# Patient Record
Sex: Male | Born: 1990 | Race: Black or African American | Hispanic: No | Marital: Single | Smoking: Never smoker
Health system: Southern US, Community
[De-identification: ages and names within clinical notes are randomized; demographics above are authoritative.]

## PROBLEM LIST (undated history)

## (undated) DIAGNOSIS — M549 Dorsalgia, unspecified: Secondary | ICD-10-CM

## (undated) DIAGNOSIS — R569 Unspecified convulsions: Secondary | ICD-10-CM

## (undated) DIAGNOSIS — M303 Mucocutaneous lymph node syndrome [Kawasaki]: Secondary | ICD-10-CM

---

## 2013-02-16 ENCOUNTER — Emergency Department (HOSPITAL_COMMUNITY)
Admission: EM | Admit: 2013-02-16 | Discharge: 2013-02-16 | Disposition: A | Discharge status: Home / Self Care | Payer: BC Managed Care – PPO | Source: Home / Self Care | Attending: Family Medicine | Admitting: Family Medicine

## 2013-02-16 ENCOUNTER — Encounter (HOSPITAL_COMMUNITY): Payer: Self-pay | Admitting: *Deleted

## 2013-02-16 DIAGNOSIS — B9789 Other viral agents as the cause of diseases classified elsewhere: Secondary | ICD-10-CM

## 2013-02-16 DIAGNOSIS — B349 Viral infection, unspecified: Secondary | ICD-10-CM

## 2013-02-16 HISTORY — DX: Dorsalgia, unspecified: M54.9

## 2013-02-16 MED ORDER — PROMETHAZINE HCL 25 MG PO TABS: 25.0000 mg | ORAL_TABLET | Freq: Four times a day (QID) | ORAL | Status: DC | PRN

## 2013-02-16 MED ORDER — ONDANSETRON 4 MG PO TBDP
4.0000 mg | ORAL_TABLET | Freq: Once | ORAL | Status: AC
  Administered 2013-02-16: 4 mg via ORAL

## 2013-02-16 MED ORDER — ONDANSETRON 4 MG PO TBDP
ORAL_TABLET | ORAL | Status: AC
  Filled 2013-02-16: qty 1

## 2013-02-16 NOTE — ED Notes (Signed)
PT REPORTS  SYMPTOMS  OF  COUGH  ./  CONGESTED       SORETHROAT         SNEEZING      WITH  THE  SYMPTOMS  STARTING  SEV  DAYS  AGO                pT  REPORTS  THE  COUGH  IS  PRODUCING MUCOUS  HE  REPORTS   -  AT  THIS TIME  THE  PT IS  SITTING UPRIGHT ON THE   EXAM TABLE      SPEAKING IN  COMPLETE  SENTANCES

## 2013-02-16 NOTE — ED Provider Notes (Signed)
Medical screening examination/treatment/procedure(s) were performed by non-physician practitioner and as supervising physician I was immediately available for consultation/collaboration.   [facility]; MD  Sharin Grave, MD 02/16/13 1318

## 2013-02-16 NOTE — ED Provider Notes (Signed)
CSN: [id number]     Arrival date & time 02/16/13  1039 History   First MD Initiated Contact with Patient 02/16/13 1127     Chief Complaint  Patient presents with  . URI   (Consider location/radiation/quality/duration/timing/severity/associated sxs/prior Treatment) Patient is a 22 y.o. male presenting with URI. The history is provided by the patient.  URI Presenting symptoms: congestion, cough, fever and sore throat   Presenting symptoms: no ear pain and no rhinorrhea   Severity:  Moderate Onset quality:  Gradual Duration:  3 days Timing:  Constant Progression:  Unchanged Chronicity:  New Relieved by:  Nothing Worsened by:  Eating Ineffective treatments:  OTC medications Associated symptoms: headaches   Associated symptoms: no sinus pain, no swollen glands and no wheezing   Associated symptoms comment:  Vomiting x 2 in last 2 days   Past Medical History  Diagnosis Date  . Back pain    History reviewed. No pertinent past surgical history. History reviewed. No pertinent family history. History  Substance Use Topics  . Smoking status: Not on file  . Smokeless tobacco: Not on file  . Alcohol Use: Not on file    Review of Systems  Constitutional: Positive for fever and chills.  HENT: Positive for congestion and sore throat. Negative for ear pain and rhinorrhea.   Respiratory: Positive for cough. Negative for wheezing.   Gastrointestinal: Positive for nausea and vomiting. Negative for abdominal pain, diarrhea and constipation.  Neurological: Positive for headaches.    Allergies  Review of patient's allergies indicates not on file.  Home Medications   Current Outpatient Rx  Name  Route  Sig  Dispense  Refill  . GuaiFENesin (MUCINEX PO)   Oral   Take by mouth.         . promethazine (PHENERGAN) 25 MG tablet   Oral   Take 1 tablet (25 mg total) by mouth every 6 (six) hours as needed for nausea.   12 tablet   0    BP 131/84  Pulse 86  Temp(Src) 99.1 F (37.3  C) (Oral)  Resp 20  SpO2 99% Physical Exam  Constitutional: He appears well-developed and well-nourished. No distress.  HENT:  Right Ear: Tympanic membrane, external ear and ear canal normal.  Left Ear: Tympanic membrane, external ear and ear canal normal.  Nose: Mucosal edema present.  Mouth/Throat: Oropharynx is clear and moist and mucous membranes are normal.  Cardiovascular: Normal rate and regular rhythm.   Pulmonary/Chest: Effort normal and breath sounds normal.  Abdominal: Soft. Bowel sounds are normal. He exhibits no distension. There is no tenderness. There is no rigidity, no rebound and no guarding.    ED Course  Procedures (including critical care time) Labs Review Labs Reviewed - No data to display Imaging Review No results found.  MDM   1. Viral infection   I have seen a number of patients lately with cold sx and vomiting without any other GI sx.  I suspect this is a virus. Encouraged use of otc meds to help manage sx. Given zofran 4mg  ODT here in [facility], rx phenergan 25mg  q6 prn nausea #12. Encouraged po fluid intake to prevent dehydration.      Cathlyn Parsons, NP 02/16/13 1135

## 2013-08-14 ENCOUNTER — Emergency Department (HOSPITAL_COMMUNITY)
Admission: EM | Admit: 2013-08-14 | Discharge: 2013-08-14 | Disposition: A | Discharge status: Home / Self Care | Payer: BC Managed Care – PPO | Source: Home / Self Care | Attending: Family Medicine | Admitting: Family Medicine

## 2013-08-14 ENCOUNTER — Encounter (HOSPITAL_COMMUNITY): Payer: Self-pay | Admitting: Emergency Medicine

## 2013-08-14 DIAGNOSIS — K529 Noninfective gastroenteritis and colitis, unspecified: Secondary | ICD-10-CM

## 2013-08-14 DIAGNOSIS — K5289 Other specified noninfective gastroenteritis and colitis: Secondary | ICD-10-CM

## 2013-08-14 HISTORY — DX: Mucocutaneous lymph node syndrome (kawasaki): M30.3

## 2013-08-14 MED ORDER — ONDANSETRON 4 MG PO TBDP
8.0000 mg | ORAL_TABLET | Freq: Once | ORAL | Status: AC
  Administered 2013-08-14: 8 mg via ORAL

## 2013-08-14 MED ORDER — ONDANSETRON 8 MG PO TBDP: 8.0000 mg | ORAL_TABLET | Freq: Three times a day (TID) | ORAL | Status: DC | PRN

## 2013-08-14 MED ORDER — DIPHENOXYLATE-ATROPINE 2.5-0.025 MG PO TABS: 2.0000 | ORAL_TABLET | Freq: Four times a day (QID) | ORAL | Status: DC | PRN

## 2013-08-14 MED ORDER — ONDANSETRON 4 MG PO TBDP
ORAL_TABLET | ORAL | Status: AC
  Filled 2013-08-14: qty 2

## 2013-08-14 NOTE — Discharge Instructions (Signed)
Diet for Diarrhea, Adult °Frequent, runny stools (diarrhea) may be caused or worsened by food or drink. Diarrhea may be relieved by changing your diet. Since diarrhea can last up to 7 days, it is easy for you to lose too much fluid from the body and become dehydrated. Fluids that are lost need to be replaced. Along with a modified diet, make sure you drink enough fluids to keep your urine clear or pale yellow. °DIET INSTRUCTIONS °· Ensure adequate fluid intake (hydration): have 1 cup (8 oz) of fluid for each diarrhea episode. Avoid fluids that contain simple sugars or sports drinks, fruit juices, whole milk products, and sodas. Your urine should be clear or pale yellow if you are drinking enough fluids. Hydrate with an oral rehydration solution that you can purchase at pharmacies, retail stores, and online. You can prepare an oral rehydration solution at home by mixing the following ingredients together: °·   tsp table salt. °· ¾ tsp baking soda. °·  tsp salt substitute containing potassium chloride. °· 1  tablespoons sugar. °· 1 L (34 oz) of water. °· Certain foods and beverages may increase the speed at which food moves through the gastrointestinal (GI) tract. These foods and beverages should be avoided and include: °· Caffeinated and alcoholic beverages. °· High-fiber foods, such as raw fruits and vegetables, nuts, seeds, and whole grain breads and cereals. °· Foods and beverages sweetened with sugar alcohols, such as xylitol, sorbitol, and mannitol. °· Some foods may be well tolerated and may help thicken stool including: °· Starchy foods, such as rice, toast, pasta, low-sugar cereal, oatmeal, grits, baked potatoes, crackers, and bagels.   °· Bananas.   °· Applesauce. °· Add probiotic-rich foods to help increase healthy bacteria in the GI tract, such as yogurt and fermented milk products. °RECOMMENDED FOODS AND BEVERAGES °Starches °Choose foods with less than 2 g of fiber per serving. °· Recommended:  White,  French, and pita breads, plain rolls, buns, bagels. Plain muffins, matzo. Soda, saltine, or graham crackers. Pretzels, melba toast, zwieback. Cooked cereals made with water: cornmeal, farina, cream cereals. Dry cereals: refined corn, wheat, rice. Potatoes prepared any way without skins, refined macaroni, spaghetti, noodles, refined rice. °· Avoid:  Bread, rolls, or crackers made with whole wheat, multi-grains, rye, bran seeds, nuts, or coconut. Corn tortillas or taco shells. Cereals containing whole grains, multi-grains, bran, coconut, nuts, raisins. Cooked or dry oatmeal. Coarse wheat cereals, granola. Cereals advertised as "high-fiber." Potato skins. Whole grain pasta, wild or brown rice. Popcorn. Sweet potatoes, yams. Sweet rolls, doughnuts, waffles, pancakes, sweet breads. °Vegetables °· Recommended: Strained tomato and vegetable juices. Most well-cooked and canned vegetables without seeds. Fresh: Tender lettuce, cucumber without the skin, cabbage, spinach, bean sprouts. °· Avoid: Fresh, cooked, or canned: Artichokes, baked beans, beet greens, broccoli, Brussels sprouts, corn, kale, legumes, peas, sweet potatoes. Cooked: Green or red cabbage, spinach. Avoid large servings of any vegetables because vegetables shrink when cooked, and they contain more fiber per serving than fresh vegetables. °Fruit °· Recommended: Cooked or canned: Apricots, applesauce, cantaloupe, cherries, fruit cocktail, grapefruit, grapes, kiwi, mandarin oranges, peaches, pears, plums, watermelon. Fresh: Apples without skin, ripe banana, grapes, cantaloupe, cherries, grapefruit, peaches, oranges, plums. Keep servings limited to ½ cup or 1 piece. °· Avoid: Fresh: Apples with skin, apricots, mangoes, pears, raspberries, strawberries. Prune juice, stewed or dried prunes. Dried fruits, raisins, dates. Large servings of all fresh fruits. °Protein °· Recommended: Ground or well-cooked tender beef, ham, veal, lamb, pork, or poultry. Eggs. Fish,  oysters, shrimp,   lobster, other seafoods. Liver, organ meats. °· Avoid: Tough, fibrous meats with gristle. Peanut butter, smooth or chunky. Cheese, nuts, seeds, legumes, dried peas, beans, lentils. °Dairy °· Recommended: Yogurt, lactose-free milk, kefir, drinkable yogurt, buttermilk, soy milk, or plain hard cheese. °· Avoid: Milk, chocolate milk, beverages made with milk, such as milkshakes. °Soups °· Recommended: Bouillon, broth, or soups made from allowed foods. Any strained soup. °· Avoid: Soups made from vegetables that are not allowed, cream or milk-based soups. °Desserts and Sweets °· Recommended: Sugar-free gelatin, sugar-free frozen ice pops made without sugar alcohol. °· Avoid: Plain cakes and cookies, pie made with fruit, pudding, custard, cream pie. Gelatin, fruit, ice, sherbet, frozen ice pops. Ice cream, ice milk without nuts. Plain hard candy, honey, jelly, molasses, syrup, sugar, chocolate syrup, gumdrops, marshmallows. °Fats and Oils °· Recommended: Limit fats to less than 8 tsp per day. °· Avoid: Seeds, nuts, olives, avocados. Margarine, butter, cream, mayonnaise, salad oils, plain salad dressings. Plain gravy, crisp bacon without rind. °Beverages °· Recommended: Water, decaffeinated teas, oral rehydration solutions, sugar-free beverages not sweetened with sugar alcohols. °· Avoid: Fruit juices, caffeinated beverages (coffee, tea, soda), alcohol, sports drinks, or lemon-lime soda. °Condiments °· Recommended: Ketchup, mustard, horseradish, vinegar, cocoa powder. Spices in moderation: allspice, basil, bay leaves, celery powder or leaves, cinnamon, cumin powder, curry powder, ginger, mace, marjoram, onion or garlic powder, oregano, paprika, parsley flakes, ground pepper, rosemary, sage, savory, tarragon, thyme, turmeric. °· Avoid: Coconut, honey. °Document Released: 08/02/2003 Document Revised: 02/04/2012 Document Reviewed: 09/26/2011 °ExitCare® Patient Information ©2014 [employer]. ° °Viral  Gastroenteritis °Viral gastroenteritis is also known as stomach flu. This condition affects the stomach and intestinal tract. It can cause sudden diarrhea and vomiting. The illness typically lasts 3 to 8 days. Most people develop an immune response that eventually gets rid of the virus. While this natural response develops, the virus can make you quite ill. °CAUSES  °Many different viruses can cause gastroenteritis, such as rotavirus or noroviruses. You can catch one of these viruses by consuming contaminated food or water. You may also catch a virus by sharing utensils or other personal items with an infected person or by touching a contaminated surface. °SYMPTOMS  °The most common symptoms are diarrhea and vomiting. These problems can cause a severe loss of body fluids (dehydration) and a body salt (electrolyte) imbalance. Other symptoms may include: °· Fever. °· Headache. °· Fatigue. °· Abdominal pain. °DIAGNOSIS  °Your caregiver can usually diagnose viral gastroenteritis based on your symptoms and a physical exam. A stool sample may also be taken to test for the presence of viruses or other infections. °TREATMENT  °This illness typically goes away on its own. Treatments are aimed at rehydration. The most serious cases of viral gastroenteritis involve vomiting so severely that you are not able to keep fluids down. In these cases, fluids must be given through an intravenous line (IV). °HOME CARE INSTRUCTIONS  °· Drink enough fluids to keep your urine clear or pale yellow. Drink small amounts of fluids frequently and increase the amounts as tolerated. °· Ask your caregiver for specific rehydration instructions. °· Avoid: °· Foods high in sugar. °· Alcohol. °· Carbonated drinks. °· Tobacco. °· Juice. °· Caffeine drinks. °· Extremely hot or cold fluids. °· Fatty, greasy foods. °· Too much intake of anything at one time. °· Dairy products until 24 to 48 hours after diarrhea stops. °· You may consume probiotics.  Probiotics are active cultures of beneficial bacteria. They may lessen the amount and number   of diarrheal stools in adults. Probiotics can be found in yogurt with active cultures and in supplements. °· Wash your hands well to avoid spreading the virus. °· Only take over-the-counter or prescription medicines for pain, discomfort, or fever as directed by your caregiver. Do not give aspirin to children. Antidiarrheal medicines are not recommended. °· Ask your caregiver if you should continue to take your regular prescribed and over-the-counter medicines. °· Keep all follow-up appointments as directed by your caregiver. °SEEK IMMEDIATE MEDICAL CARE IF:  °· You are unable to keep fluids down. °· You do not urinate at least once every 6 to 8 hours. °· You develop shortness of breath. °· You notice blood in your stool or vomit. This may look like coffee grounds. °· You have abdominal pain that increases or is concentrated in one small area (localized). °· You have persistent vomiting or diarrhea. °· You have a fever. °· The patient is a child younger than 3 months, and he or she has a fever. °· The patient is a child older than 3 months, and he or she has a fever and persistent symptoms. °· The patient is a child older than 3 months, and he or she has a fever and symptoms suddenly get worse. °· The patient is a baby, and he or she has no tears when crying. °MAKE SURE YOU:  °· Understand these instructions. °· Will watch your condition. °· Will get help right away if you are not doing well or get worse. °Document Released: 05/12/2005 Document Revised: 08/04/2011 Document Reviewed: 02/26/2011 °ExitCare® Patient Information ©2014 [employer]. ° °

## 2013-08-14 NOTE — ED Notes (Signed)
Patient with diarrhea and vomiting.  Onset last night after eating a meal that he did not think chicken was cooked well enough.  Reports diarrhea and vomiting all night and generalized abdominal pain.

## 2013-08-14 NOTE — ED Provider Notes (Signed)
Medical screening examination/treatment/procedure(s) were performed by resident physician or non-physician practitioner and as supervising physician I was immediately available for consultation/collaboration.   Barkley BrunsKINDL,Dalisha Shively DOUGLAS MD.   Linna HoffJames D Shi Grose, MD 08/14/13 [phone]

## 2013-08-14 NOTE — ED Provider Notes (Signed)
CSN: [id number]     Arrival date & time 08/14/13  0912 History   First MD Initiated Contact with Patient 08/14/13 [id number]     Chief Complaint  Patient presents with  . Diarrhea  . Emesis   (Consider location/radiation/quality/duration/timing/severity/associated sxs/prior Treatment) HPI Comments: 23 year old male presents complaining of nausea, vomiting, diarrhea. He Ate some chicken fingers that he thinks are undercooked yesterday.  NVD since 3 AM, he has had about 11 episodes of watery diarrhea. He has had multiple episodes of vomiting but does seem to temporarily hold down liquids. He denies any blood in the diarrhea or vomitus. He denies any fever. He has not tried taking anything for this at home. He has been trying to keep up his fluids overnight so he does not get dehydrated.  He has a normal cramping abdominal pain, pain is not severe it started after the vomiting  Patient is a 23 y.o. male presenting with diarrhea and vomiting.  Diarrhea Associated symptoms: vomiting   Associated symptoms: no abdominal pain, no arthralgias, no chills, no fever and no myalgias   Emesis Associated symptoms: diarrhea   Associated symptoms: no abdominal pain, no arthralgias, no chills, no myalgias and no sore throat     Past Medical History  Diagnosis Date  . Back pain   . Kawasaki disease    History reviewed. No pertinent past surgical history. No family history on file. History  Substance Use Topics  . Smoking status: Never Smoker   . Smokeless tobacco: Not on file  . Alcohol Use: No    Review of Systems  Constitutional: Negative for fever, chills and fatigue.  HENT: Negative for sore throat.   Eyes: Negative for visual disturbance.  Respiratory: Negative for cough and shortness of breath.   Cardiovascular: Negative for chest pain, palpitations and leg swelling.  Gastrointestinal: Positive for nausea, vomiting and diarrhea. Negative for abdominal pain, constipation and blood in stool.   Genitourinary: Negative for dysuria, urgency, frequency and hematuria.  Musculoskeletal: Negative for arthralgias, myalgias, neck pain and neck stiffness.  Skin: Negative for rash.  Neurological: Negative for dizziness, weakness and light-headedness.    Allergies  Review of patient's allergies indicates no known allergies.  Home Medications   Current Outpatient Rx  Name  Route  Sig  Dispense  Refill  . diphenoxylate-atropine (LOMOTIL) 2.5-0.025 MG per tablet   Oral   Take 2 tablets by mouth 4 (four) times daily as needed for diarrhea or loose stools.   30 tablet   0   . GuaiFENesin (MUCINEX PO)   Oral   Take by mouth.         . ondansetron (ZOFRAN ODT) 8 MG disintegrating tablet   Oral   Take 1 tablet (8 mg total) by mouth every 8 (eight) hours as needed for nausea or vomiting.   12 tablet   0   . promethazine (PHENERGAN) 25 MG tablet   Oral   Take 1 tablet (25 mg total) by mouth every 6 (six) hours as needed for nausea.   12 tablet   0    BP 123/69  Pulse 85  Temp(Src) 98.4 F (36.9 C) (Oral)  Resp 18  SpO2 100% Physical Exam  Nursing note and vitals reviewed. Constitutional: He is oriented to person, place, and time. He appears well-developed and well-nourished. No distress.  HENT:  Head: Normocephalic.  Cardiovascular: Normal rate, regular rhythm and normal heart sounds.  Exam reveals no gallop and no friction rub.   No murmur  heard. Pulmonary/Chest: Effort normal and breath sounds normal. No respiratory distress. He has no wheezes. He has no rales.  Abdominal: Soft. There is generalized tenderness (minimal ). There is no rigidity, no rebound, no guarding, no CVA tenderness, no tenderness at McBurney's point and negative Murphy's sign. No hernia.  Neurological: He is alert and oriented to person, place, and time. Coordination normal.  Skin: Skin is warm and dry. No rash noted. He is not diaphoretic.  Psychiatric: He has a normal mood and affect. Judgment  normal.    ED Course  Procedures (including critical care time) Labs Review Labs Reviewed - No data to display Imaging Review No results found.   MDM   1. Gastroenteritis    Afebrile, nontoxic, with minimal abdominal pain, treating for viral gastroenteritis with Lomotil and Zofran. Followup in 3-4 days if no improvement. He may followup sooner if worsening or symptoms are not controlled with medications  Meds ordered this encounter  Medications  . ondansetron (ZOFRAN-ODT) disintegrating tablet 8 mg    Sig:   . ondansetron (ZOFRAN ODT) 8 MG disintegrating tablet    Sig: Take 1 tablet (8 mg total) by mouth every 8 (eight) hours as needed for nausea or vomiting.    Dispense:  12 tablet    Refill:  0    Order Specific Question:  Supervising Provider    Answer:  Linna Hoff [phone]  . diphenoxylate-atropine (LOMOTIL) 2.5-0.025 MG per tablet    Sig: Take 2 tablets by mouth 4 (four) times daily as needed for diarrhea or loose stools.    Dispense:  30 tablet    Refill:  0    Order Specific Question:  Supervising Provider    Answer:  Bradd Canary D [[id number]]     Graylon Good, PA-C 08/14/13 1022

## 2013-11-22 ENCOUNTER — Emergency Department (INDEPENDENT_AMBULATORY_CARE_PROVIDER_SITE_OTHER): Payer: BC Managed Care – PPO

## 2013-11-22 ENCOUNTER — Emergency Department (HOSPITAL_COMMUNITY)
Admission: EM | Admit: 2013-11-22 | Discharge: 2013-11-22 | Disposition: A | Discharge status: Home / Self Care | Payer: BC Managed Care – PPO | Source: Home / Self Care | Attending: Family Medicine | Admitting: Family Medicine

## 2013-11-22 ENCOUNTER — Encounter (HOSPITAL_COMMUNITY): Payer: Self-pay | Admitting: Emergency Medicine

## 2013-11-22 DIAGNOSIS — S43109A Unspecified dislocation of unspecified acromioclavicular joint, initial encounter: Secondary | ICD-10-CM

## 2013-11-22 DIAGNOSIS — W1809XA Striking against other object with subsequent fall, initial encounter: Secondary | ICD-10-CM

## 2013-11-22 DIAGNOSIS — S43102A Unspecified dislocation of left acromioclavicular joint, initial encounter: Secondary | ICD-10-CM

## 2013-11-22 MED ORDER — DICLOFENAC SODIUM 75 MG PO TBEC: 75.0000 mg | DELAYED_RELEASE_TABLET | Freq: Two times a day (BID) | ORAL | Status: DC | PRN

## 2013-11-22 MED ORDER — TRAMADOL HCL 50 MG PO TABS: 50.0000 mg | ORAL_TABLET | Freq: Four times a day (QID) | ORAL | Status: DC | PRN

## 2013-11-22 NOTE — ED Notes (Signed)
Reports injury left shoulder last night while getting out of the shower.  States slipped getting out of shower hitting left shoulder.  Unable to raise arm above head.  No relief with otc meds.

## 2013-11-22 NOTE — ED Provider Notes (Signed)
Gerald Anderson is a 23 y.o. male who presents to Urgent Care today for left shoulder injury. Patient is a right-hand dominant individual who suffered a slip and fall in the shower 2 days ago. He fell onto his left shoulder. He notes significant pain in the anterior aspect of the shoulder. The pain is worse with any abduction and external rotation. He denies any radiating pain weakness or numbness. He denies any fevers or chills nausea vomiting or diarrhea. History of Advil which helped only a little. The pain is severe and worse with activity.   Past Medical History  Diagnosis Date  . Back pain   . Kawasaki disease    History  Substance Use Topics  . Smoking status: Never Smoker   . Smokeless tobacco: Not on file  . Alcohol Use: Yes   ROS as above Medications: No current facility-administered medications for this encounter.   Current Outpatient Prescriptions  Medication Sig Dispense Refill  . diclofenac (VOLTAREN) 75 MG EC tablet Take 1 tablet (75 mg total) by mouth 2 (two) times daily as needed.  60 tablet  0  . traMADol (ULTRAM) 50 MG tablet Take 1 tablet (50 mg total) by mouth every 6 (six) hours as needed.  15 tablet  0  . [DISCONTINUED] promethazine (PHENERGAN) 25 MG tablet Take 1 tablet (25 mg total) by mouth every 6 (six) hours as needed for nausea.  12 tablet  0    Exam:  BP 135/88  Pulse 78  Temp(Src) 98.1 F (36.7 C) (Oral)  Resp 16  SpO2 100% Gen: Well NAD HEENT: EOMI,  MMM Lungs: Normal work of breathing. CTABL Heart: RRR no MRG Abd: NABS, Soft. NT, ND Exts: Brisk capillary refill, warm and well perfused.  Left shoulder: Ecchymosis and tenderness at the a.c. Joint Nontender along the mid to distal clavicle No visual deformity Range of motion normal external and internal rotation. Significant pain and guarding with abduction beyond Unable to perform impingement or strength testing because of pain  Left elbow and wrist are nontender normal motion. Grip strength  pulses capillary refill and sensation are intact distally.  Limited musculoskeletal ultrasound of the left shoulder:  A.c. joint is widened with a 30-50% separation. No bony abnormalities. Tender to probe Biceps tendon is normal-appearing and intact and in the groove Subscapularis tendon is normal-appearing with no tears Supraspinatus tendon is normal-appearing with no tears Infraspinatus is not well visualized but no obvious tears seen.  Impression: A.c. separation  No results found for this or any previous visit (from the past 24 hour(s)). Dg Shoulder Left  11/22/2013   CLINICAL DATA:  Fall and left shoulder pain.  EXAM: LEFT SHOULDER - 2+ VIEW  COMPARISON:  None.  FINDINGS: The left shoulder is located without acute fracture. Difficult to exclude a minimally displaced fracture involving the mid left clavicle. In addition, the lateral left clavicle is slightly elevated and difficult to exclude a left AC joint injury. Visualized left ribs are intact.  IMPRESSION: Mild cortical irregularity involving the left clavicle and a subtle fracture cannot be excluded in this area. In addition, it is difficult to exclude a left AC joint injury. If this is the area of clinical concern, consider dedicated clavicle and/or AC joint imaging.  Left shoulder is located.   Electronically Signed   By: Richarda OverlieAdam  Henn M.D.   On: 11/22/2013 09:03    Assessment and Plan: 23 y.o. male with a.c. separation likely grade 2.. Doubtful for clavicle fracture as patient is nontender  in the area of question on x-ray. Additionally ultrasound supports diagnosis of clavicle fracture. Very doubtful for rotator cuff tear. Plan for sling NSAIDs and tramadol. Followup at sports medicine as needed.  Discussed warning signs or symptoms. Please see discharge instructions. Patient expresses understanding.    Rodolph BongEvan S Corey, MD 11/22/13 [phone]

## 2013-11-22 NOTE — Discharge Instructions (Signed)
Thank you for coming in today. Take diclofenac twice daily it will not make you sleepy.  Use tramadol for severe pain. Do not take and drive or go to work.   Acromioclavicular Separation with Rehab The acromioclavicular joint is the joint between the roof of the shoulder (acromion) and the collarbone (clavicle). It is vulnerable to injury. An acromioclavicular [facility]) separation is a partial or complete tear (sprain), injury, or redness and soreness (inflammation) of the ligaments that cross the acromioclavicular joint and hold it in place. There are two ligaments in this area that are vulnerable to injury, the acromioclavicular ligament and the coracoclavicular ligament. SYMPTOMS   Tenderness and swelling, or a bump on top of the shoulder (at the [facility] joint).  Bruising (contusion) in the area within 48 hours of injury.  Loss of strength or pain when reaching over the head or across the body. CAUSES  AC separation is caused by direct trauma to the joint (falling on your shoulder) or indirect trauma (falling on an outstretched arm). RISK INCREASES WITH:  Sports that require contact or collision, throwing sports (i.e. racquetball, squash).  Poor strength and flexibility.  Previous shoulder sprain or dislocation.  Poorly fitted or padded protective equipment. PREVENTION   Warm-up and stretch properly before activity.  Maintain physical fitness:  Shoulder strength.  Shoulder flexibility.  Cardiovascular fitness.  Wear properly fitted and padded protective equipment.  Learn and use proper technique when playing sports. Have a coach correct improper technique, including falling and landing.  Apply taping, protective strapping or padding, or an adhesive bandage as recommended before practice or competition. PROGNOSIS   If treated properly, the symptoms of AC separation can be expected to go away.  If treated improperly, permanent disability may occur unless surgery is  performed.  Healing time varies with type of sport and position, arm injured (dominant versus non-dominant) and severity of sprain. RELATED COMPLICATIONS  Weakness and fatigue of the arm or shoulder are possible but uncommon.  Pain and inflammation of the [facility] joint may continue.  Prolonged healing time may be necessary if usual activities are resumed too early. This causes a susceptibility to recurrent injury.  Prolonged disability may occur.  The shoulder may remain unstable or arthritic following repeated injury. TREATMENT  Treatment initially involves ice and medication to help reduce pain and inflammation. It may also be necessary to modify your activities in order to prevent further injury. Both non-surgical and surgical interventions exist to treat AC separation. Non-surgical intervention is usually recommended and involves wearing a sling to immobilize the joint for a period of time to allow for healing. Surgical intervention is usually only considered for severe sprains of the ligament or for individuals who do not improve after 2 to 6 months of non-surgical treatment. Surgical interventions require 4 to 6 months before a return to sports is possible. MEDICATION  If pain medication is necessary, nonsteroidal anti-inflammatory medications, such as aspirin and ibuprofen, or other minor pain relievers, such as acetaminophen, are often recommended.  Do not take pain medication for 7 days before surgery.  Prescription pain relievers may be given by your caregiver. Use only as directed and only as much as you need.  Ointments applied to the skin may be helpful.  Corticosteroid injections may be given to reduce inflammation. HEAT AND COLD  Cold treatment (icing) relieves pain and reduces inflammation. Cold treatment should be applied for 10 to 15 minutes every 2 to 3 hours for inflammation and pain and immediately after any  activity that aggravates your symptoms. Use ice packs or an  ice massage.  Heat treatment may be used prior to performing the stretching and strengthening activities prescribed by your caregiver, physical therapist or athletic trainer. Use a heat pack or a warm soak. SEEK IMMEDIATE MEDICAL CARE IF:   Pain, swelling or bruising worsens despite treatment.  There is pain, numbness or coldness in the arm.  Discoloration appears in the fingernails.  New, unexplained symptoms develop. EXERCISES  RANGE OF MOTION (ROM) AND STRETCHING EXERCISES - Acromioclavicular Separation These exercises may help you when beginning to rehabilitate your injury. Your symptoms may resolve with or without further involvement from your physician, physical therapist or athletic trainer. While completing these exercises, remember:  Restoring tissue flexibility helps normal motion to return to the joints. This allows healthier, less painful movement and activity.  An effective stretch should be held for at least 30 seconds.  A stretch should never be painful. You should only feel a gentle lengthening or release in the stretched tissue. ROM - Pendulum  Bend at the waist so that your right / left arm falls away from your body. Support yourself with your opposite hand on a solid surface, such as a table or a countertop.  Your right / left arm should be perpendicular to the ground. If it is not perpendicular, you need to lean over farther. Relax the muscles in your right / left arm and shoulder as much as possible.  Gently sway your hips and trunk so they move your right / left arm without any use of your right / left shoulder muscles.  Progress your movements so that your right / left arm moves side to side, then forward and backward, and finally, both clockwise and counterclockwise.  Complete __________ repetitions in each direction. Many people use this exercise to relieve discomfort in their shoulder as well as to gain range of motion. Repeat __________ times. Complete this  exercise __________ times per day. STRETCH - Flexion, Seated   Sit in a firm chair so that your right / left forearm can rest on a table or countertop. Your right / left elbow should rest below the height of your shoulder so that your shoulder feels supported and not tense or uncomfortable.  Keeping your right / left shoulder relaxed, lean forward at your waist, allowing your right / left hand to slide forward. Bend forward until you feel a moderate stretch in your shoulder, but before you feel an increase in your pain.  Hold __________ seconds. Slowly return to your starting position. Repeat __________ times. Complete this exercise __________ times per day. STRETCH - Flexion, Standing  Stand with good posture. With an underhand grip on your right / left and an overhand grip on the opposite hand, grasp a broomstick or cane so that your hands are a little more than shoulder-width apart.  Keeping your right / left elbow straight and shoulder muscles relaxed, push the stick with your opposite hand to raise your right / left arm in front of your body and then overhead. Raise your arm until you feel a stretch in your right / left shoulder, but before you have increased shoulder pain.  Try to avoid shrugging your right / left shoulder as your arm rises by keeping your shoulder blade tucked down and toward your mid-back spine. Hold __________ seconds.  Slowly return to the starting position. Repeat __________ times. Complete this exercise __________ times per day. STRENGTHENING EXERCISES - Acromioclavicular Separation These exercises may  help you when beginning to rehabilitate your injury. They may resolve your symptoms with or without further involvement from your physician, physical therapist or athletic trainer. While completing these exercises, remember:  Muscles can gain both the endurance and the strength needed for everyday activities through controlled exercises.  Complete these exercises as  instructed by your physician, physical therapist or athletic trainer. Progress the resistance and repetitions only as guided.  You may experience muscle soreness or fatigue, but the pain or discomfort you are trying to eliminate should never worsen during these exercises. If this pain does worsen, stop and make certain you are following the directions exactly. If the pain is still present after adjustments, discontinue the exercise until you can discuss the trouble with your clinician. STRENGTH - Shoulder Abductors, Isometric   With good posture, stand or sit about 4-6 inches from a wall with your right / left side facing the wall.  Bend your right / left elbow. Gently press your right / left elbow into the wall. Increase the pressure gradually until you are pressing as hard as you can without shrugging your shoulder or increasing any shoulder discomfort.  Hold __________ seconds.  Release the tension slowly. Relax your shoulder muscles completely before you start the next repetition. Repeat __________ times. Complete this exercise __________ times per day. STRENGTH - Internal Rotators, Isometric  Keep your right / left elbow at your side and bend it 90 degrees.  Step into a door frame so that the inside of your right / left wrist can press against the door frame without your upper arm leaving your side.  Gently press your right / left wrist into the door frame as if you were trying to draw the palm of your hand to your abdomen. Gradually increase the tension until you are pressing as hard as you can without shrugging your shoulder or increasing any shoulder discomfort.  Hold __________ seconds.  Release the tension slowly. Relax your shoulder muscles completely before you the next repetition. Repeat __________ times. Complete this exercise __________ times per day.  STRENGTH - External Rotators, Isometric  Keep your right / left elbow at your side and bend it 90 degrees.  Step into a  door frame so that the outside of your right / left wrist can press against the door frame without your upper arm leaving your side.  Gently press your right / left wrist into the door frame as if you were trying to swing the back of your hand away from your abdomen. Gradually increase the tension until you are pressing as hard as you can without shrugging your shoulder or increasing any shoulder discomfort.  Hold __________ seconds.  Release the tension slowly. Relax your shoulder muscles completely before you the next repetition. Repeat __________ times. Complete this exercise __________ times per day. STRENGTH - Internal Rotators  Secure a rubber exercise band/tubing to a fixed object so that it is at the same height as your right / left elbow when you are standing or sitting on a firm surface.  Stand or sit so that the secured exercise band/tubing is at your right / left side.  Bend your elbow 90 degrees. Place a folded towel or small pillow under your right / left arm so that your elbow is a few inches away from your side.  Keeping the tension on the exercise band/tubing, pull it across your body toward your abdomen. Be sure to keep your body steady so that the movement is only coming  from your shoulder rotating.  Hold __________ seconds. Release the tension in a controlled manner as you return to the starting position. Repeat __________ times. Complete this exercise __________ times per day. STRENGTH - External Rotators  Secure a rubber exercise band/tubing to a fixed object so that it is at the same height as your right / left elbow when you are standing or sitting on a firm surface.  Stand or sit so that the secured exercise band/tubing is at your side that is not injured.  Bend your elbow 90 degrees. Place a folded towel or small pillow under your right / left arm so that your elbow is a few inches away from your side.  Keeping the tension on the exercise band/tubing, pull it  away from your body, as if pivoting on your elbow. Be sure to keep your body steady so that the movement is only coming from your shoulder rotating.  Hold __________ seconds. Release the tension in a controlled manner as you return to the starting position. Repeat __________ times. Complete this exercise __________ times per day. Document Released: 05/12/2005 Document Revised: 08/04/2011 Document Reviewed: 08/24/2008 [facility] Patient Information 2015 [place], [place]. This information is not intended to replace advice given to you by your health care provider. Make sure you discuss any questions you have with your health care provider.

## 2014-07-11 ENCOUNTER — Emergency Department (HOSPITAL_COMMUNITY)
Admission: EM | Admit: 2014-07-11 | Discharge: 2014-07-11 | Disposition: A | Discharge status: Home / Self Care | Payer: BLUE CROSS/BLUE SHIELD | Attending: Emergency Medicine | Admitting: Emergency Medicine

## 2014-07-11 ENCOUNTER — Encounter (HOSPITAL_COMMUNITY): Payer: Self-pay | Admitting: Adult Health

## 2014-07-11 DIAGNOSIS — M545 Low back pain: Secondary | ICD-10-CM | POA: Diagnosis not present

## 2014-07-11 DIAGNOSIS — Z791 Long term (current) use of non-steroidal anti-inflammatories (NSAID): Secondary | ICD-10-CM | POA: Diagnosis not present

## 2014-07-11 DIAGNOSIS — G8929 Other chronic pain: Secondary | ICD-10-CM | POA: Insufficient documentation

## 2014-07-11 MED ORDER — KETOROLAC TROMETHAMINE 30 MG/ML IJ SOLN
60.0000 mg | Freq: Once | INTRAMUSCULAR | Status: AC
  Administered 2014-07-11: 60 mg via INTRAMUSCULAR
  Filled 2014-07-11: qty 2

## 2014-07-11 NOTE — Discharge Instructions (Signed)
Recommend 600 mg ibuprofen every 6 hours as needed for back pain. Alternate ice and heat to your back 3-4 times per day. Also recommend that you make contact with a primary care provider. You may follow-up with a neurosurgeon if desired. You have been scheduled for an outpatient MRI. You may have this completed if you would like to further evaluate the cause of your worsening pain. Return to the ED if you develop worsening back pain with fever, loss of your bowel/bladder function, or any of the symptoms listed below.  Chronic Back Pain  When back pain lasts longer than 3 months, it is called chronic back pain.People with chronic back pain often go through certain periods that are more intense (flare-ups).  CAUSES Chronic back pain can be caused by wear and tear (degeneration) on different structures in your back. These structures include:  The bones of your spine (vertebrae) and the joints surrounding your spinal cord and nerve roots (facets).  The strong, fibrous tissues that connect your vertebrae (ligaments). Degeneration of these structures may result in pressure on your nerves. This can lead to constant pain. HOME CARE INSTRUCTIONS  Avoid bending, heavy lifting, prolonged sitting, and activities which make the problem worse.  Take brief periods of rest throughout the day to reduce your pain. Lying down or standing usually is better than sitting while you are resting.  Take over-the-counter or prescription medicines only as directed by your caregiver. SEEK IMMEDIATE MEDICAL CARE IF:   You have weakness or numbness in one of your legs or feet.  You have trouble controlling your bladder or bowels.  You have nausea, vomiting, abdominal pain, shortness of breath, or fainting. Document Released: 06/19/2004 Document Revised: 08/04/2011 Document Reviewed: 04/26/2011 [facility] Patient Information 2015 [place], [place]. This information is not intended to replace advice given to you by your  health care provider. Make sure you discuss any questions you have with your health care provider.   Back Exercises Back exercises help treat and prevent back injuries. The goal of back exercises is to increase the strength of your abdominal and back muscles and the flexibility of your back. These exercises should be started when you no longer have back pain. Back exercises include:  Pelvic Tilt. Lie on your back with your knees bent. Tilt your pelvis until the lower part of your back is against the floor. Hold this position 5 to 10 sec and repeat 5 to 10 times.  Knee to Chest. Pull first 1 knee up against your chest and hold for 20 to 30 seconds, repeat this with the other knee, and then both knees. This may be done with the other leg straight or bent, whichever feels better.  Sit-Ups or Curl-Ups. Bend your knees 90 degrees. Start with tilting your pelvis, and do a partial, slow sit-up, lifting your trunk only 30 to 45 degrees off the floor. Take at least 2 to 3 seconds for each sit-up. Do not do sit-ups with your knees out straight. If partial sit-ups are difficult, simply do the above but with only tightening your abdominal muscles and holding it as directed.  Hip-Lift. Lie on your back with your knees flexed 90 degrees. Push down with your feet and shoulders as you raise your hips a couple inches off the floor; hold for 10 seconds, repeat 5 to 10 times.  Back arches. Lie on your stomach, propping yourself up on bent elbows. Slowly press on your hands, causing an arch in your low back. Repeat 3 to  5 times. Any initial stiffness and discomfort should lessen with repetition over time.  Shoulder-Lifts. Lie face down with arms beside your body. Keep hips and torso pressed to floor as you slowly lift your head and shoulders off the floor. Do not overdo your exercises, especially in the beginning. Exercises may cause you some mild back discomfort which lasts for a few minutes; however, if the pain is  more severe, or lasts for more than 15 minutes, do not continue exercises until you see your caregiver. Improvement with exercise therapy for back problems is slow.  See your caregivers for assistance with developing a proper back exercise program. Document Released: 06/19/2004 Document Revised: 08/04/2011 Document Reviewed: 03/13/2011 [facility] Patient Information 2015 [place], [place]. This information is not intended to replace advice given to you by your health care provider. Make sure you discuss any questions you have with your health care provider.  Back Injury Prevention Back injuries can be extremely painful and difficult to heal. After having one back injury, you are much more likely to experience another later on. It is important to learn how to avoid injuring or re-injuring your back. The following tips can help you to prevent a back injury. PHYSICAL FITNESS  Exercise regularly and try to develop good tone in your abdominal muscles. Your abdominal muscles provide a lot of the support needed by your back.  Do aerobic exercises (walking, jogging, biking, swimming) regularly.  Do exercises that increase balance and strength (tai chi, yoga) regularly. This can decrease your risk of falling and injuring your back.  Stretch before and after exercising.  Maintain a healthy weight. The more you weigh, the more stress is placed on your back. For every pound of weight, 10 times that amount of pressure is placed on the back. DIET  Talk to your caregiver about how much calcium and vitamin D you need per day. These nutrients help to prevent weakening of the bones (osteoporosis). Osteoporosis can cause broken (fractured) bones that lead to back pain.  Include good sources of calcium in your diet, such as dairy products, green, leafy vegetables, and products with calcium added (fortified).  Include good sources of vitamin D in your diet, such as milk and foods that are fortified with vitamin  D.  Consider taking a nutritional supplement or a multivitamin if needed.  Stop smoking if you smoke. POSTURE  Sit and stand up straight. Avoid leaning forward when you sit or hunching over when you stand.  Choose chairs with good low back (lumbar) support.  If you work at a desk, sit close to your work so you do not need to lean over. Keep your chin tucked in. Keep your neck drawn back and elbows bent at a right angle. Your arms should look like the letter "L."  Sit high and close to the steering wheel when you drive. Add a lumbar support to your car seat if needed.  Avoid sitting or standing in one position for too long. Take breaks to get up, stretch, and walk around at least once every hour. Take breaks if you are driving for long periods of time.  Sleep on your side with your knees slightly bent, or sleep on your back with a pillow under your knees. Do not sleep on your stomach. LIFTING, TWISTING, AND REACHING  Avoid heavy lifting, especially repetitive lifting. If you must do heavy lifting:  Stretch before lifting.  Work slowly.  Rest between lifts.  Use carts and dollies to move objects when  possible.  Make several small trips instead of carrying 1 heavy load.  Ask for help when you need it.  Ask for help when moving big, awkward objects.  Follow these steps when lifting:  Stand with your feet shoulder-width apart.  Get as close to the object as you can. Do not try to pick up heavy objects that are far from your body.  Use handles or lifting straps if they are available.  Bend at your knees. Squat down, but keep your heels off the floor.  Keep your shoulders pulled back, your chin tucked in, and your back straight.  Lift the object slowly, tightening the muscles in your legs, abdomen, and buttocks. Keep the object as close to the center of your body as possible.  When you put a load down, use these same guidelines in reverse.  Do not:  Lift the object  above your waist.  Twist at the waist while lifting or carrying a load. Move your feet if you need to turn, not your waist.  Bend over without bending at your knees.  Avoid reaching over your head, across a table, or for an object on a high surface. OTHER TIPS  Avoid wet floors and keep sidewalks clear of ice to prevent falls.  Do not sleep on a mattress that is too soft or too hard.  Keep items that are used frequently within easy reach.  Put heavier objects on shelves at waist level and lighter objects on lower or higher shelves.  Find ways to decrease your stress, such as exercise, massage, or relaxation techniques. Stress can build up in your muscles. Tense muscles are more vulnerable to injury.  Seek treatment for depression or anxiety if needed. These conditions can increase your risk of developing back pain. SEEK MEDICAL CARE IF:  You injure your back.  You have questions about diet, exercise, or other ways to prevent back injuries. MAKE SURE YOU:  Understand these instructions.  Will watch your condition.  Will get help right away if you are not doing well or get worse. Document Released: 06/19/2004 Document Revised: 08/04/2011 Document Reviewed: 06/23/2011 [facility] Patient Information 2015 [place], [place]. This information is not intended to replace advice given to you by your health care provider. Make sure you discuss any questions you have with your health care provider.

## 2014-07-11 NOTE — ED Provider Notes (Signed)
CSN: [id number]     Arrival date & time 07/11/14  1955 History   First MD Initiated Contact with Patient 07/11/14 2023     Chief Complaint  Patient presents with  . Back Pain    (Consider location/radiation/quality/duration/timing/severity/associated sxs/prior Treatment) HPI Comments: 24 year old male with history of back pain presents to the emergency department for further evaluation of pain to his low back. Patient states that he injured his back at work 1.5 years ago. He has a history of 2 herniated disks diagnosed by MRI. Patient saw a neurosurgeon in [place] at onset of his back pain, but has not been followed by a specialist since moving to [place]. He was offered surgery by his previous neurosurgeon which she declined at the time. He states he is concerned because his back pain has been gradually worsening. He reports an aching, sharp/stabbing pain in his low back which is intermittent and waxing and waning. He reports radiation of the pain down his posterior legs, left greater than right. He has taken 200 mg ibuprofen for symptoms as needed with very little improvement. He does get some relief after using a Stim machine. He denies any new trauma or injury to his back as well as a history of cancer or IV drug use. No associated fever, genital or perianal numbness, bowel/bladder incontinence, extremity numbness/weakness, or an inability to ambulate.  Patient is a 24 y.o. male presenting with back pain.  Back Pain Location:  Lumbar spine Quality:  Aching and stabbing Radiates to:  L posterior upper leg and R posterior upper leg Pain severity:  Moderate Duration:  18 months Timing:  Intermittent Progression:  Waxing and waning Chronicity:  Chronic Context: occupational injury (at onset of symptoms 18 months ago)   Context: not falling   Relieved by: Stim machine. Ineffective treatments:  NSAIDs Associated symptoms: no bladder incontinence, no bowel incontinence, no dysuria, no  fever, no numbness, no paresthesias, no pelvic pain, no perianal numbness, no tingling and no weakness     Past Medical History  Diagnosis Date  . Back pain   . Kawasaki disease    History reviewed. No pertinent past surgical history. History reviewed. No pertinent family history. History  Substance Use Topics  . Smoking status: Never Smoker   . Smokeless tobacco: Not on file  . Alcohol Use: Yes    Review of Systems  Constitutional: Negative for fever.  Gastrointestinal: Negative for bowel incontinence.  Genitourinary: Negative for bladder incontinence, dysuria and pelvic pain.  Musculoskeletal: Positive for back pain.  Neurological: Negative for tingling, weakness, numbness and paresthesias.  All other systems reviewed and are negative.   Allergies  Review of patient's allergies indicates no known allergies.  Home Medications   Prior to Admission medications   Medication Sig Start Date End Date Taking? Authorizing Provider  diclofenac (VOLTAREN) 75 MG EC tablet Take 1 tablet (75 mg total) by mouth 2 (two) times daily as needed. 11/22/13   Rodolph Bong, MD  traMADol (ULTRAM) 50 MG tablet Take 1 tablet (50 mg total) by mouth every 6 (six) hours as needed. 11/22/13   Rodolph Bong, MD   BP 151/96 mmHg  Pulse 59  Temp(Src) 98 F (36.7 C) (Oral)  Resp 18  Ht  (1.854 m)  Wt 170 lb (77.111 kg)  BMI 22.43 kg/m2  SpO2 100%   Physical Exam  Constitutional: He is oriented to person, place, and time. He appears well-developed and well-nourished. No distress.  Nontoxic/nonseptic appearing  HENT:  Head: Normocephalic and atraumatic.  Eyes: Conjunctivae and EOM are normal. No scleral icterus.  Neck: Normal range of motion.  Cardiovascular: Normal rate, regular rhythm and intact distal pulses.   DP and PT pulses 2+ bilaterally  Pulmonary/Chest: Effort normal. No respiratory distress.  Respirations even and unlabored  Musculoskeletal: Normal range of motion. He exhibits  tenderness.       Lumbar back: He exhibits tenderness, bony tenderness and pain. He exhibits normal range of motion, no swelling, no deformity, no spasm and normal pulse.       Back:  Tenderness to palpation at approximately L4-S1. No bony deformities, step-offs, or crepitus. There is mild left lumbar paraspinal tenderness.  Neurological: He is alert and oriented to person, place, and time. He exhibits normal muscle tone. Coordination normal.  Sensation to light touch intact. Patellar and Achilles reflexes 2+ bilaterally.  Skin: Skin is warm and dry. No rash noted. He is not diaphoretic. No erythema. No pallor.  Psychiatric: He has a normal mood and affect. His behavior is normal.  Nursing note and vitals reviewed.   ED Course  Procedures (including critical care time) Labs Review Labs Reviewed - No data to display  Imaging Review No results found.   EKG Interpretation None      MDM   Final diagnoses:  Acute exacerbation of chronic low back pain    Patient with back pain. Hx of similar chronic back pain. Patient concerned because of worsening pain over 1.5 years. No recent trauma/injury. Patient neurovascularly intact and ambulatory. No sensory changes. No loss of bowel or bladder control. No concern for cauda equina. No fever, h/o cancer, or h/o IVDU.   I did discuss workup with an x-ray, but I do not believe this would be helpful to the patient. Patient agrees. I have explained that an emergent MRI is not indicated at this time, but have offered to schedule an outpatient MRI for the patient. Patient also has no primary care provider in the area; [facility]. Patient accepts tx in ED with Toradol. He declines outpatient prescriptions for PRN pain control. RICE protocol indicated and discussed with patient. Patient agreeable to plan with no unaddressed concerns. Patient discharged in good condition.   Filed Vitals:   07/11/14 2003  BP:  151/96  Pulse: 59  Temp: 98 F (36.7 C)  TempSrc: Oral  Resp: 18  Height: 6\' 1"  (1.854 m)  Weight: 170 lb (77.111 kg)  SpO2: 100%        Antony MaduraKelly Kymere Fullington, PA-C 07/11/14 2121  Audree CamelScott T Goldston, MD 07/18/14 1932

## 2014-07-11 NOTE — ED Notes (Signed)
Presents with lower back pain-hx of 2 herniated disks, pain radiates down legs. It has been ongoing for "a while" CMS intact

## 2014-07-19 ENCOUNTER — Emergency Department (HOSPITAL_COMMUNITY): Payer: BLUE CROSS/BLUE SHIELD

## 2014-07-19 ENCOUNTER — Emergency Department (HOSPITAL_COMMUNITY)
Admission: EM | Admit: 2014-07-19 | Discharge: 2014-07-19 | Disposition: A | Discharge status: Home / Self Care | Payer: BLUE CROSS/BLUE SHIELD | Attending: Emergency Medicine | Admitting: Emergency Medicine

## 2014-07-19 ENCOUNTER — Encounter (HOSPITAL_COMMUNITY): Payer: Self-pay

## 2014-07-19 DIAGNOSIS — R197 Diarrhea, unspecified: Secondary | ICD-10-CM | POA: Diagnosis not present

## 2014-07-19 DIAGNOSIS — Z79899 Other long term (current) drug therapy: Secondary | ICD-10-CM | POA: Insufficient documentation

## 2014-07-19 DIAGNOSIS — R1084 Generalized abdominal pain: Secondary | ICD-10-CM | POA: Insufficient documentation

## 2014-07-19 DIAGNOSIS — R109 Unspecified abdominal pain: Secondary | ICD-10-CM

## 2014-07-19 DIAGNOSIS — R079 Chest pain, unspecified: Secondary | ICD-10-CM | POA: Diagnosis not present

## 2014-07-19 DIAGNOSIS — Z8739 Personal history of other diseases of the musculoskeletal system and connective tissue: Secondary | ICD-10-CM | POA: Insufficient documentation

## 2014-07-19 DIAGNOSIS — Z791 Long term (current) use of non-steroidal anti-inflammatories (NSAID): Secondary | ICD-10-CM | POA: Insufficient documentation

## 2014-07-19 DIAGNOSIS — G8929 Other chronic pain: Secondary | ICD-10-CM | POA: Insufficient documentation

## 2014-07-19 DIAGNOSIS — R112 Nausea with vomiting, unspecified: Secondary | ICD-10-CM

## 2014-07-19 LAB — COMPREHENSIVE METABOLIC PANEL
ALT: 20 U/L (ref 0–53)
AST: 30 U/L (ref 0–37)
Albumin: 5.1 g/dL (ref 3.5–5.2)
Alkaline Phosphatase: 53 U/L (ref 39–117)
Anion gap: 14 (ref 5–15)
BUN: 16 mg/dL (ref 6–23)
CALCIUM: 10.2 mg/dL (ref 8.4–10.5)
CO2: 20 mmol/L (ref 19–32)
Chloride: 104 mmol/L (ref 96–112)
Creatinine, Ser: 1.3 mg/dL (ref 0.50–1.35)
GFR calc non Af Amer: 77 mL/min — ABNORMAL LOW (ref >90)
GFR, EST AFRICAN AMERICAN: 90 mL/min — AB (ref >90)
GLUCOSE: 126 mg/dL — AB (ref 70–99)
Potassium: 4.7 mmol/L (ref 3.5–5.1)
Sodium: 138 mmol/L (ref 135–145)
TOTAL PROTEIN: 8.3 g/dL (ref 6.0–8.3)
Total Bilirubin: 1.8 mg/dL — ABNORMAL HIGH (ref 0.3–1.2)

## 2014-07-19 LAB — URINALYSIS, ROUTINE W REFLEX MICROSCOPIC
Bilirubin Urine: NEGATIVE
Glucose, UA: NEGATIVE mg/dL
HGB URINE DIPSTICK: NEGATIVE
KETONES UR: 15 mg/dL — AB
Leukocytes, UA: NEGATIVE
Nitrite: NEGATIVE
PROTEIN: NEGATIVE mg/dL
Specific Gravity, Urine: 1.015 (ref 1.005–1.030)
UROBILINOGEN UA: 0.2 mg/dL (ref 0.0–1.0)
pH: 7.5 (ref 5.0–8.0)

## 2014-07-19 LAB — CK TOTAL AND CKMB (NOT AT ARMC)
CK, MB: 1.2 ng/mL (ref 0.3–4.0)
Relative Index: 0.7 (ref 0.0–2.5)
Total CK: 181 U/L (ref 7–232)

## 2014-07-19 LAB — CBC WITH DIFFERENTIAL/PLATELET
BASOS ABS: 0 10*3/uL (ref 0.0–0.1)
Basophils Relative: 0 % (ref 0–1)
EOS ABS: 0 10*3/uL (ref 0.0–0.7)
EOS PCT: 0 % (ref 0–5)
HCT: 47 % (ref 39.0–52.0)
Hemoglobin: 16.2 g/dL (ref 13.0–17.0)
Lymphocytes Relative: 6 % — ABNORMAL LOW (ref 12–46)
Lymphs Abs: 1.1 10*3/uL (ref 0.7–4.0)
MCH: 29.8 pg (ref 26.0–34.0)
MCHC: 34.5 g/dL (ref 30.0–36.0)
MCV: 86.6 fL (ref 78.0–100.0)
MONO ABS: 1.3 10*3/uL — AB (ref 0.1–1.0)
Monocytes Relative: 7 % (ref 3–12)
NEUTROS PCT: 87 % — AB (ref 43–77)
Neutro Abs: 15.7 10*3/uL — ABNORMAL HIGH (ref 1.7–7.7)
PLATELETS: 202 10*3/uL (ref 150–400)
RBC: 5.43 MIL/uL (ref 4.22–5.81)
RDW: 12.5 % (ref 11.5–15.5)
WBC: 18.1 10*3/uL — AB (ref 4.0–10.5)

## 2014-07-19 LAB — LIPASE, BLOOD: LIPASE: 59 U/L (ref 11–59)

## 2014-07-19 LAB — TROPONIN I

## 2014-07-19 LAB — I-STAT CG4 LACTIC ACID, ED: Lactic Acid, Venous: 3.52 mmol/L (ref 0.5–2.0)

## 2014-07-19 MED ORDER — DICYCLOMINE HCL 10 MG/ML IM SOLN
20.0000 mg | Freq: Once | INTRAMUSCULAR | Status: AC
  Administered 2014-07-19: 20 mg via INTRAMUSCULAR
  Filled 2014-07-19: qty 2

## 2014-07-19 MED ORDER — FENTANYL CITRATE 0.05 MG/ML IJ SOLN
50.0000 ug | Freq: Once | INTRAMUSCULAR | Status: AC
  Administered 2014-07-19: 50 ug via INTRAVENOUS
  Filled 2014-07-19: qty 2

## 2014-07-19 MED ORDER — SODIUM CHLORIDE 0.9 % IV BOLUS (SEPSIS)
1000.0000 mL | Freq: Once | INTRAVENOUS | Status: AC
  Administered 2014-07-19: 1000 mL via INTRAVENOUS

## 2014-07-19 MED ORDER — ONDANSETRON 8 MG PO TBDP: 8.0000 mg | ORAL_TABLET | Freq: Three times a day (TID) | ORAL | Status: DC | PRN

## 2014-07-19 MED ORDER — MORPHINE SULFATE 4 MG/ML IJ SOLN
4.0000 mg | Freq: Once | INTRAMUSCULAR | Status: AC
  Administered 2014-07-19: 4 mg via INTRAVENOUS
  Filled 2014-07-19: qty 1

## 2014-07-19 MED ORDER — IOHEXOL 300 MG/ML  SOLN
25.0000 mL | Freq: Once | INTRAMUSCULAR | Status: AC | PRN
  Administered 2014-07-19: 25 mL via ORAL

## 2014-07-19 MED ORDER — HYDROCODONE-ACETAMINOPHEN 5-325 MG PO TABS: 2.0000 | ORAL_TABLET | ORAL | Status: DC | PRN

## 2014-07-19 MED ORDER — ONDANSETRON HCL 4 MG/2ML IJ SOLN
4.0000 mg | Freq: Once | INTRAMUSCULAR | Status: AC
  Administered 2014-07-19: 4 mg via INTRAVENOUS
  Filled 2014-07-19: qty 2

## 2014-07-19 MED ORDER — IOHEXOL 300 MG/ML  SOLN
80.0000 mL | Freq: Once | INTRAMUSCULAR | Status: AC | PRN
  Administered 2014-07-19: 80 mL via INTRAVENOUS

## 2014-07-19 MED ORDER — HYDROMORPHONE HCL 1 MG/ML IJ SOLN
1.0000 mg | Freq: Once | INTRAMUSCULAR | Status: AC
  Administered 2014-07-19: 1 mg via INTRAVENOUS
  Filled 2014-07-19: qty 1

## 2014-07-19 MED ORDER — DIPHENOXYLATE-ATROPINE 2.5-0.025 MG PO TABS: 2.0000 | ORAL_TABLET | Freq: Four times a day (QID) | ORAL | Status: DC | PRN

## 2014-07-19 NOTE — ED Notes (Signed)
Pt reports central sharp CP that started last night at 2300. Pt also reports multiple episodes of vomiting, back pain and diarrhea.

## 2014-07-19 NOTE — ED Notes (Signed)
Initial NaCl fluid bolus started.

## 2014-07-19 NOTE — ED Provider Notes (Signed)
CSN: [id number]     Arrival date & time 07/19/14  0410 History   First MD Initiated Contact with Patient 07/19/14 [phone]     Chief Complaint  Patient presents with  . Chest Pain  . Emesis     (Consider location/radiation/quality/duration/timing/severity/associated sxs/prior Treatment) HPI 24 year old male presents to emergency with complaint of diffuse abdominal pain, nausea, vomiting and diarrhea with chest pain starting at 11:00 last night.  Patient reports pain in belly started first, followed by vomiting, followed by chest pain followed by diarrhea.  Patient reports pain is sharp and crampy in nature.  He has had too numerous to count episodes of vomiting and diarrhea.  No fevers no chills, no known sick contacts.  Patient has history of chronic back pain from herniated disc reports his back pain is worse tonight after all the vomiting.  Patient also complaining of lower extremity cramping, especially in his feet. Past Medical History  Diagnosis Date  . Back pain   . Kawasaki disease    History reviewed. No pertinent past surgical history. No family history on file. History  Substance Use Topics  . Smoking status: Never Smoker   . Smokeless tobacco: Not on file  . Alcohol Use: Yes    Review of Systems   See History of Present Illness; otherwise all other systems are reviewed and negative  Allergies  Review of patient's allergies indicates no known allergies.  Home Medications   Prior to Admission medications   Medication Sig Start Date End Date Taking? Authorizing Provider  naproxen sodium (ANAPROX) 220 MG tablet Take 440 mg by mouth daily.   Yes Historical Provider, MD  diclofenac (VOLTAREN) 75 MG EC tablet Take 1 tablet (75 mg total) by mouth 2 (two) times daily as needed. Patient not taking: Reported on 07/19/2014 11/22/13   Rodolph BongEvan S Corey, MD  traMADol (ULTRAM) 50 MG tablet Take 1 tablet (50 mg total) by mouth every 6 (six) hours as needed. Patient not taking: Reported on  07/19/2014 11/22/13   Rodolph BongEvan S Corey, MD   BP 126/65 mmHg  Pulse 88  Resp 11  SpO2 98% Physical Exam  Constitutional: He is oriented to person, place, and time. He appears well-developed and well-nourished.  HENT:  Head: Normocephalic and atraumatic.  Nose: Nose normal.  Mouth/Throat: Oropharynx is clear and moist.  Eyes: Conjunctivae and EOM are normal. Pupils are equal, round, and reactive to light.  Neck: Normal range of motion. Neck supple. No JVD present. No tracheal deviation present. No thyromegaly present.  Cardiovascular: Normal rate, regular rhythm, normal heart sounds and intact distal pulses.  Exam reveals no gallop and no friction rub.   No murmur heard. Pulmonary/Chest: Effort normal and breath sounds normal. No stridor. No respiratory distress. He has no wheezes. He has no rales. He exhibits no tenderness.  Abdominal: Soft. He exhibits no distension and no mass. There is tenderness. There is guarding. There is no rebound.  Hypoactive bowel sounds.  Diffuse severe abdominal pain without rebound.  He has voluntary guarding.  Musculoskeletal: Normal range of motion. He exhibits no edema or tenderness.  Lymphadenopathy:    He has no cervical adenopathy.  Neurological: He is alert and oriented to person, place, and time. He displays normal reflexes. He exhibits normal muscle tone. Coordination normal.  Skin: Skin is warm and dry. No rash noted. No erythema. No pallor.  Psychiatric: He has a normal mood and affect. His behavior is normal. Judgment and thought content normal.  Nursing note and  vitals reviewed.   ED Course  Procedures (including critical care time) Labs Review Labs Reviewed  CBC WITH DIFFERENTIAL/PLATELET - Abnormal; Notable for the following:    WBC 18.1 (*)    All other components within normal limits  I-STAT CG4 LACTIC ACID, ED - Abnormal; Notable for the following:    Lactic Acid, Venous 3.52 (*)    All other components within normal limits  COMPREHENSIVE  METABOLIC PANEL  LIPASE, BLOOD  TROPONIN I  CK TOTAL AND CKMB    Imaging Review No results found.   EKG Interpretation   Date/Time:  Wednesday July 19 2014 04:24:00 EST Ventricular Rate:  95 PR Interval:  147 QRS Duration: 95 QT Interval:  360 QTC Calculation: 452 R Axis:   78 Text Interpretation:  Sinus rhythm Biatrial enlargement Left ventricular  hypertrophy ST elevation suggests acute pericarditis No old tracing to  compare Confirmed by Resean Brander  MD, Bonny Egger ([id number]) on 07/19/2014 4:32:45 AM      MDM   Final diagnoses:  Chest pain    24 year old male with acute onset of nausea, vomiting, diarrhea, chest pain.  EKG with diffuse ST elevation, pericarditis versus early repulse.  No prior EKGs to compare to.  Patient has significantly elevated lactate and white blood cell count.  Patient reports that chest pain started after vomiting.  Concern for pneumomediastinum or esophageal perforation.  Patient ill appearing, uncomfortable, moaning in pain.  Care passed to Dr Blinda Leatherwood awaiting CT scans.  Olivia Mackie, MD 07/20/14 [phone]

## 2014-07-19 NOTE — Discharge Instructions (Signed)
Abdominal Pain  Many things can cause abdominal pain. Usually, abdominal pain is not caused by a disease and will improve without treatment. It can often be observed and treated at home. Your health care provider will do a physical exam and possibly order blood tests and X-rays to help determine the seriousness of your pain. However, in many cases, more time must pass before a clear cause of the pain can be found. Before that point, your health care provider may not know if you need more testing or further treatment.  HOME CARE INSTRUCTIONS   Monitor your abdominal pain for any changes. The following actions may help to alleviate any discomfort you are experiencing:   Only take over-the-counter or prescription medicines as directed by your health care provider.   Do not take laxatives unless directed to do so by your health care provider.   Try a clear liquid diet (broth, tea, or water) as directed by your health care provider. Slowly move to a bland diet as tolerated.  SEEK MEDICAL CARE IF:   You have unexplained abdominal pain.   You have abdominal pain associated with nausea or diarrhea.   You have pain when you urinate or have a bowel movement.   You experience abdominal pain that wakes you in the night.   You have abdominal pain that is worsened or improved by eating food.   You have abdominal pain that is worsened with eating fatty foods.   You have a fever.  SEEK IMMEDIATE MEDICAL CARE IF:    Your pain does not go away within 2 hours.   You keep throwing up (vomiting).   Your pain is felt only in portions of the abdomen, such as the right side or the left lower portion of the abdomen.   You pass bloody or black tarry stools.  MAKE SURE YOU:   Understand these instructions.    Will watch your condition.    Will get help right away if you are not doing well or get worse.   Document Released: 02/19/2005 Document Revised: 05/17/2013 Document Reviewed: 01/19/2013  ExitCare Patient Information  2015 [employer]. This information is not intended to replace advice given to you by your health care provider. Make sure you discuss any questions you have with your health care provider.  Diarrhea  Diarrhea is frequent loose and watery bowel movements. It can cause you to feel weak and dehydrated. Dehydration can cause you to become tired and thirsty, have a dry mouth, and have decreased urination that often is dark yellow. Diarrhea is a sign of another problem, most often an infection that will not last long. In most cases, diarrhea typically lasts 2-3 days. However, it can last longer if it is a sign of something more serious. It is important to treat your diarrhea as directed by your caregiver to lessen or prevent future episodes of diarrhea.  CAUSES   Some common causes include:   Gastrointestinal infections caused by viruses, bacteria, or parasites.   Food poisoning or food allergies.   Certain medicines, such as antibiotics, chemotherapy, and laxatives.   Artificial sweeteners and fructose.   Digestive disorders.  HOME CARE INSTRUCTIONS   Ensure adequate fluid intake (hydration): Have 1 cup (8 oz) of fluid for each diarrhea episode. Avoid fluids that contain simple sugars or sports drinks, fruit juices, whole milk products, and sodas. Your urine should be clear or pale yellow if you are drinking enough fluids. Hydrate with an oral rehydration solution that you   can purchase at pharmacies, retail stores, and online. You can prepare an oral rehydration solution at home by mixing the following ingredients together:    - tsp table salt.    tsp baking soda.    tsp salt substitute containing potassium chloride.   1  tablespoons sugar.   1 L (34 oz) of water.   Certain foods and beverages may increase the speed at which food moves through the gastrointestinal (GI) tract. These foods and beverages should be avoided and include:   Caffeinated and alcoholic beverages.   High-fiber foods, such as raw  fruits and vegetables, nuts, seeds, and whole grain breads and cereals.   Foods and beverages sweetened with sugar alcohols, such as xylitol, sorbitol, and mannitol.   Some foods may be well tolerated and may help thicken stool including:   Starchy foods, such as rice, toast, pasta, low-sugar cereal, oatmeal, grits, baked potatoes, crackers, and bagels.   Bananas.   Applesauce.   Add probiotic-rich foods to help increase healthy bacteria in the GI tract, such as yogurt and fermented milk products.   Wash your hands well after each diarrhea episode.   Only take over-the-counter or prescription medicines as directed by your caregiver.   Take a warm bath to relieve any burning or pain from frequent diarrhea episodes.  SEEK IMMEDIATE MEDICAL CARE IF:    You are unable to keep fluids down.   You have persistent vomiting.   You have blood in your stool, or your stools are black and tarry.   You do not urinate in 6-8 hours, or there is only a small amount of very dark urine.   You have abdominal pain that increases or localizes.   You have weakness, dizziness, confusion, or light-headedness.   You have a severe headache.   Your diarrhea gets worse or does not get better.   You have a fever or persistent symptoms for more than 2-3 days.   You have a fever and your symptoms suddenly get worse.  MAKE SURE YOU:    Understand these instructions.   Will watch your condition.   Will get help right away if you are not doing well or get worse.  Document Released: 05/02/2002 Document Revised: 09/26/2013 Document Reviewed: 01/18/2012  ExitCare Patient Information 2015 [employer]. This information is not intended to replace advice given to you by your health care provider. Make sure you discuss any questions you have with your health care provider.  Nausea and Vomiting  Nausea is a sick feeling that often comes before throwing up (vomiting). Vomiting is a reflex where stomach contents come out of your mouth.  Vomiting can cause severe loss of body fluids (dehydration). Children and elderly adults can become dehydrated quickly, especially if they also have diarrhea. Nausea and vomiting are symptoms of a condition or disease. It is important to find the cause of your symptoms.  CAUSES    Direct irritation of the stomach lining. This irritation can result from increased acid production (gastroesophageal reflux disease), infection, food poisoning, taking certain medicines (such as nonsteroidal anti-inflammatory drugs), alcohol use, or tobacco use.   Signals from the brain.These signals could be caused by a headache, heat exposure, an inner ear disturbance, increased pressure in the brain from injury, infection, a tumor, or a concussion, pain, emotional stimulus, or metabolic problems.   An obstruction in the gastrointestinal tract (bowel obstruction).   Illnesses such as diabetes, hepatitis, gallbladder problems, appendicitis, kidney problems, cancer, sepsis, atypical symptoms of a   heart attack, or eating disorders.   Medical treatments such as chemotherapy and radiation.   Receiving medicine that makes you sleep (general anesthetic) during surgery.  DIAGNOSIS  Your caregiver may ask for tests to be done if the problems do not improve after a few days. Tests may also be done if symptoms are severe or if the reason for the nausea and vomiting is not clear. Tests may include:   Urine tests.   Blood tests.   Stool tests.   Cultures (to look for evidence of infection).   X-rays or other imaging studies.  Test results can help your caregiver make decisions about treatment or the need for additional tests.  TREATMENT  You need to stay well hydrated. Drink frequently but in small amounts.You may wish to drink water, sports drinks, clear broth, or eat frozen ice pops or gelatin dessert to help stay hydrated.When you eat, eating slowly may help prevent nausea.There are also some antinausea medicines that may help  prevent nausea.  HOME CARE INSTRUCTIONS    Take all medicine as directed by your caregiver.   If you do not have an appetite, do not force yourself to eat. However, you must continue to drink fluids.   If you have an appetite, eat a normal diet unless your caregiver tells you differently.   Eat a variety of complex carbohydrates (rice, wheat, potatoes, bread), lean meats, yogurt, fruits, and vegetables.   Avoid high-fat foods because they are more difficult to digest.   Drink enough water and fluids to keep your urine clear or pale yellow.   If you are dehydrated, ask your caregiver for specific rehydration instructions. Signs of dehydration may include:   Severe thirst.   Dry lips and mouth.   Dizziness.   Dark urine.   Decreasing urine frequency and amount.   Confusion.   Rapid breathing or pulse.  SEEK IMMEDIATE MEDICAL CARE IF:    You have blood or brown flecks (like coffee grounds) in your vomit.   You have black or bloody stools.   You have a severe headache or stiff neck.   You are confused.   You have severe abdominal pain.   You have chest pain or trouble breathing.   You do not urinate at least once every 8 hours.   You develop cold or clammy skin.   You continue to vomit for longer than 24 to 48 hours.   You have a fever.  MAKE SURE YOU:    Understand these instructions.   Will watch your condition.   Will get help right away if you are not doing well or get worse.  Document Released: 05/12/2005 Document Revised: 08/04/2011 Document Reviewed: 10/09/2010  [employer] Patient Information 2015 [employer]. This information is not intended to replace advice given to you by your health care provider. Make sure you discuss any questions you have with your health care provider.

## 2014-07-19 NOTE — ED Notes (Signed)
Pt agitated stating he is not leaving when he feels no better than he did when he got here. Pt has been pleasant throughout my shift. Pt states he would like to speak to EDP's supervisor. Dr. Blinda LeatherwoodPollina at bedside to discuss discharge with patient. Pt non-cooperative. Will give him an opportunity to get dressed before asking security to step in.

## 2014-07-19 NOTE — ED Provider Notes (Addendum)
Patient signed out to me by Dr. Norlene Campbelltter to follow-up on CT scan. Patient was seen for chest and abdominal pain with nausea and vomiting. Patient was reportedly very distressed upon arrival. He did not have a focal or acute surgical abdominal exam, but based on his symptoms, CT chest, abdomen and pelvis was performed. No evidence of esophageal rupture, intrathoracic abnormality or abdominal abnormality noted. There is slight distention of the midportion of the appendix at 7-8 mm, but no inflammatory changes, does not appear to be consistent with acute appendicitis. Repeat examination reveals that the pain is improved. He still feels like he is dehydrated. Was given a second liter of normal saline solution. Patient will be discharged, symptomatic treatment. Return to the ER for worsening symptoms including fever, right lower quadrant pain.  Addendum: Patient resistant to leave the emergency department. When I went to discharge him the first time, he reported that he wasn't ready to leave. He was given additional fluids and antiemetics. Upon recheck, patient still states that he cannot leave. I did discuss with him that his results are unremarkable, that he is appropriate for discharge with management of his symptoms. I did write a prescription for Zofran, Lomotil, Vicodin. He reported that the Vicodin was not strong enough for him. Patient does have history of chronic back pain syndrome. Patient's initial presentation was described to me as out of proportion to his examination. This raises concern for possible withdrawal from narcotics causing his current complaints and demand for pain medication. Patient was discharged.   Gilda Creasehristopher J. Pollina, MD 07/19/14 1024  Gilda Creasehristopher J. Pollina, MD 07/19/14 [phone]

## 2014-07-19 NOTE — ED Notes (Signed)
Pt finished with oral CT contrast. CT tech made aware.

## 2014-07-21 NOTE — ED Provider Notes (Addendum)
Follow up call to patient. Patient states he is still having chest and back pain, but is eating and drinking without vomiting.  Long discussion ensued with patient regarding his symptoms and care.  Patient advised if continuing to have pain to return for recheck.  I reviewed charts from most recent visit, ct scan, and labs.  Hilario Quarryanielle S Nahuel Wilbert, MD 07/21/14 1457    Hilario Quarryanielle S Cecilia Nishikawa, MD 07/25/14 1310

## 2014-11-09 ENCOUNTER — Emergency Department (INDEPENDENT_AMBULATORY_CARE_PROVIDER_SITE_OTHER)
Admission: EM | Admit: 2014-11-09 | Discharge: 2014-11-09 | Disposition: A | Discharge status: Home / Self Care | Payer: BLUE CROSS/BLUE SHIELD | Source: Home / Self Care | Attending: Family Medicine | Admitting: Family Medicine

## 2014-11-09 ENCOUNTER — Encounter (HOSPITAL_COMMUNITY): Payer: Self-pay | Admitting: *Deleted

## 2014-11-09 DIAGNOSIS — J029 Acute pharyngitis, unspecified: Secondary | ICD-10-CM | POA: Diagnosis not present

## 2014-11-09 LAB — POCT RAPID STREP A: STREPTOCOCCUS, GROUP A SCREEN (DIRECT): NEGATIVE

## 2014-11-09 MED ORDER — IBUPROFEN 600 MG PO TABS: 600.0000 mg | ORAL_TABLET | Freq: Four times a day (QID) | ORAL | Status: DC | PRN

## 2014-11-09 MED ORDER — AMOXICILLIN 500 MG PO CAPS: 1000.0000 mg | ORAL_CAPSULE | Freq: Two times a day (BID) | ORAL | Status: DC

## 2014-11-09 NOTE — ED Provider Notes (Signed)
CSN: [id number]     Arrival date & time 11/09/14  1814 History   First MD Initiated Contact with Patient 11/09/14 1902     Chief Complaint  Patient presents with  . Sore Throat   (Consider location/radiation/quality/duration/timing/severity/associated sxs/prior Treatment) HPI Comments: 24 year old male complaining of a sore throat since yesterday. Rapid onset. He denies fevers but has had some malaise as well as sweating at night.   Past Medical History  Diagnosis Date  . Back pain   . Kawasaki disease    History reviewed. No pertinent past surgical history. History reviewed. No pertinent family history. History  Substance Use Topics  . Smoking status: Never Smoker   . Smokeless tobacco: Not on file  . Alcohol Use: Yes    Review of Systems  Constitutional: Positive for activity change. Negative for fever.  HENT: Positive for sore throat. Negative for nosebleeds, postnasal drip and rhinorrhea.   Eyes: Negative.   Respiratory: Negative for cough and shortness of breath.   Genitourinary: Negative.   Skin: Negative for rash.  Neurological: Negative.     Allergies  Review of patient's allergies indicates no known allergies.  Home Medications   Prior to Admission medications   Medication Sig Start Date End Date Taking? Authorizing Provider  amoxicillin (AMOXIL) 500 MG capsule Take 2 capsules (1,000 mg total) by mouth 2 (two) times daily. 11/09/14   Hayden Rasmussen, NP  diphenoxylate-atropine (LOMOTIL) 2.5-0.025 MG per tablet Take 2 tablets by mouth 4 (four) times daily as needed for diarrhea or loose stools. 07/19/14   Gilda Crease, MD  HYDROcodone-acetaminophen (NORCO/VICODIN) 5-325 MG per tablet Take 2 tablets by mouth every 4 (four) hours as needed for moderate pain. 07/19/14   Gilda Crease, MD  ibuprofen (ADVIL,MOTRIN) 600 MG tablet Take 1 tablet (600 mg total) by mouth every 6 (six) hours as needed. 11/09/14   Hayden Rasmussen, NP  naproxen sodium (ANAPROX) 220 MG  tablet Take 440 mg by mouth daily.    Historical Provider, MD  ondansetron (ZOFRAN ODT) 8 MG disintegrating tablet Take 1 tablet (8 mg total) by mouth every 8 (eight) hours as needed for nausea or vomiting. 07/19/14   Gilda Crease, MD   BP 136/82 mmHg  Pulse 83  Temp(Src) 98.3 F (36.8 C) (Oral)  Resp 14  SpO2 100% Physical Exam  Constitutional: He is oriented to person, place, and time. He appears well-developed and well-nourished. No distress.  HENT:  Mouth/Throat: Oropharyngeal exudate present.  Bilateral TMs are normal Oropharynx with erythema, mildly enlarged tonsils  coated with gray exudates.  Eyes: Conjunctivae and EOM are normal.  Neck: Normal range of motion. Neck supple.  Bilateral anterior cervical change lymphadenopathy  Cardiovascular: Normal rate, regular rhythm and normal heart sounds.   Pulmonary/Chest: Effort normal and breath sounds normal. No respiratory distress.  Musculoskeletal: Normal range of motion.  Lymphadenopathy:    He has cervical adenopathy.  Neurological: He is alert and oriented to person, place, and time.  Skin: Skin is warm and dry.  Nursing note and vitals reviewed.   ED Course  Procedures (including critical care time) Labs Review Labs Reviewed  POCT RAPID STREP A   Results for orders placed or performed during the hospital encounter of 11/09/14  POCT rapid strep A [facility])  Result Value Ref Range   Streptococcus, Group A Screen (Direct) NEGATIVE NEGATIVE    Imaging Review No results found.   MDM   1. Exudative pharyngitis    With the  sudden onset of throat pain in conjunction with erythematous oropharynx and tonsils, enlarged tonsils with exudates associated with fatigue and malaise and cervical adenopathy we will go ahead and treat with amoxicillin. Ibuprofen 600 mg every 6 hours as needed for discomfort, plenty of cool liquids, Cepacol lozenges for discomfort.   Hayden Rasmussen, NP 11/09/14 2002

## 2014-11-09 NOTE — Discharge Instructions (Signed)
Pharyngitis Pharyngitis is a sore throat (pharynx). There is redness, pain, and swelling of your throat. HOME CARE   Drink enough fluids to keep your pee (urine) clear or pale yellow.  Only take medicine as told by your doctor.  You may get sick again if you do not take medicine as told. Finish your medicines, even if you start to feel better.  Do not take aspirin.  Rest.  Rinse your mouth (gargle) with salt water ( tsp of salt per 1 qt of water) every 1-2 hours. This will help the pain.  If you are not at risk for choking, you can suck on hard candy or sore throat lozenges. GET HELP IF:  You have large, tender lumps on your neck.  You have a rash.  You cough up green, yellow-brown, or bloody spit. GET HELP RIGHT AWAY IF:   You have a stiff neck.  You drool or cannot swallow liquids.  You throw up (vomit) or are not able to keep medicine or liquids down.  You have very bad pain that does not go away with medicine.  You have problems breathing (not from a stuffy nose). MAKE SURE YOU:   Understand these instructions.  Will watch your condition.  Will get help right away if you are not doing well or get worse. Document Released: 10/29/2007 Document Revised: 03/02/2013 Document Reviewed: 01/17/2013 [facility] Patient Information 2015 [place], [place]. This information is not intended to replace advice given to you by your health care provider. Make sure you discuss any questions you have with your health care provider.  Sore Throat A sore throat is a painful, burning, sore, or scratchy feeling of the throat. There may be pain or tenderness when swallowing or talking. You may have other symptoms with a sore throat. These include coughing, sneezing, fever, or a swollen neck. A sore throat is often the first sign of another sickness. These sicknesses may include a cold, flu, strep throat, or an infection called mono. Most sore throats go away without medical treatment.  HOME  CARE   Only take medicine as told by your doctor.  Drink enough fluids to keep your pee (urine) clear or pale yellow.  Rest as needed.  Try using throat sprays, lozenges, or suck on hard candy (if older than 4 years or as told).  Sip warm liquids, such as broth, herbal tea, or warm water with honey. Try sucking on frozen ice pops or drinking cold liquids.  Rinse the mouth (gargle) with salt water. Mix 1 teaspoon salt with 8 ounces of water.  Do not smoke. Avoid being around others when they are smoking.  Put a humidifier in your bedroom at night to moisten the air. You can also turn on a hot shower and sit in the bathroom for 5-10 minutes. Be sure the bathroom door is closed. GET HELP RIGHT AWAY IF:   You have trouble breathing.  You cannot swallow fluids, soft foods, or your spit (saliva).  You have more puffiness (swelling) in the throat.  Your sore throat does not get better in 7 days.  You feel sick to your stomach (nauseous) and throw up (vomit).  You have a fever or lasting symptoms for more than 2-3 days.  You have a fever and your symptoms suddenly get worse. MAKE SURE YOU:   Understand these instructions.  Will watch your condition.  Will get help right away if you are not doing well or get worse. Document Released: 02/19/2008 Document Revised: 02/04/2012  Document Reviewed: 01/18/2012 [facility] Patient Information 2015 [place], [place]. This information is not intended to replace advice given to you by your health care provider. Make sure you discuss any questions you have with your health care provider.

## 2014-11-09 NOTE — ED Notes (Signed)
Pt  Reports   Symptoms  Of   sorethroat   And pain  When  He    Swallows         Symptoms  X  1  Day          He  Is  Sitting  Upright  On   Exam  Table

## 2014-11-11 NOTE — ED Notes (Signed)
Attempted to call patient to discuss positive strep findings, no answer x1

## 2014-11-12 ENCOUNTER — Encounter (HOSPITAL_COMMUNITY): Payer: Self-pay | Admitting: Emergency Medicine

## 2014-11-12 ENCOUNTER — Emergency Department (HOSPITAL_COMMUNITY): Payer: BLUE CROSS/BLUE SHIELD

## 2014-11-12 ENCOUNTER — Emergency Department (HOSPITAL_COMMUNITY)
Admission: EM | Admit: 2014-11-12 | Discharge: 2014-11-12 | Disposition: A | Discharge status: Home / Self Care | Payer: BLUE CROSS/BLUE SHIELD | Attending: Emergency Medicine | Admitting: Emergency Medicine

## 2014-11-12 DIAGNOSIS — R079 Chest pain, unspecified: Secondary | ICD-10-CM | POA: Diagnosis not present

## 2014-11-12 DIAGNOSIS — J029 Acute pharyngitis, unspecified: Secondary | ICD-10-CM | POA: Diagnosis present

## 2014-11-12 DIAGNOSIS — R042 Hemoptysis: Secondary | ICD-10-CM | POA: Diagnosis not present

## 2014-11-12 DIAGNOSIS — Z791 Long term (current) use of non-steroidal anti-inflammatories (NSAID): Secondary | ICD-10-CM | POA: Insufficient documentation

## 2014-11-12 DIAGNOSIS — R0602 Shortness of breath: Secondary | ICD-10-CM | POA: Insufficient documentation

## 2014-11-12 DIAGNOSIS — J36 Peritonsillar abscess: Secondary | ICD-10-CM | POA: Diagnosis not present

## 2014-11-12 DIAGNOSIS — Z8739 Personal history of other diseases of the musculoskeletal system and connective tissue: Secondary | ICD-10-CM | POA: Insufficient documentation

## 2014-11-12 DIAGNOSIS — Z792 Long term (current) use of antibiotics: Secondary | ICD-10-CM | POA: Insufficient documentation

## 2014-11-12 LAB — BASIC METABOLIC PANEL
Anion gap: 8 (ref 5–15)
BUN: 10 mg/dL (ref 6–20)
CO2: 26 mmol/L (ref 22–32)
Calcium: 9.2 mg/dL (ref 8.9–10.3)
Chloride: 104 mmol/L (ref 101–111)
Creatinine, Ser: 1.03 mg/dL (ref 0.61–1.24)
GFR calc non Af Amer: >60 mL/min (ref >60)
GLUCOSE: 117 mg/dL — AB (ref 65–99)
Potassium: 3.7 mmol/L (ref 3.5–5.1)
Sodium: 138 mmol/L (ref 135–145)

## 2014-11-12 LAB — CBC
HCT: 43.8 % (ref 39.0–52.0)
Hemoglobin: 15 g/dL (ref 13.0–17.0)
MCH: 29.4 pg (ref 26.0–34.0)
MCHC: 34.2 g/dL (ref 30.0–36.0)
MCV: 85.9 fL (ref 78.0–100.0)
Platelets: 141 10*3/uL — ABNORMAL LOW (ref 150–400)
RBC: 5.1 MIL/uL (ref 4.22–5.81)
RDW: 13.2 % (ref 11.5–15.5)
WBC: 7.6 10*3/uL (ref 4.0–10.5)

## 2014-11-12 LAB — I-STAT TROPONIN, ED: Troponin i, poc: 0 ng/mL (ref 0.00–0.08)

## 2014-11-12 LAB — CULTURE, GROUP A STREP

## 2014-11-12 MED ORDER — HYDROMORPHONE HCL 1 MG/ML IJ SOLN
1.0000 mg | Freq: Once | INTRAMUSCULAR | Status: AC
  Administered 2014-11-12: 1 mg via INTRAVENOUS
  Filled 2014-11-12: qty 1

## 2014-11-12 MED ORDER — KETOROLAC TROMETHAMINE 30 MG/ML IJ SOLN
30.0000 mg | Freq: Once | INTRAMUSCULAR | Status: AC
  Administered 2014-11-12: 30 mg via INTRAVENOUS
  Filled 2014-11-12: qty 1

## 2014-11-12 MED ORDER — CLINDAMYCIN PHOSPHATE 900 MG/50ML IV SOLN
900.0000 mg | Freq: Once | INTRAVENOUS | Status: DC
  Filled 2014-11-12: qty 50

## 2014-11-12 MED ORDER — LIDOCAINE HCL (PF) 1 % IJ SOLN
2.0000 mL | Freq: Once | INTRAMUSCULAR | Status: AC
  Administered 2014-11-12: 3 mL
  Filled 2014-11-12: qty 5

## 2014-11-12 MED ORDER — SODIUM CHLORIDE 0.9 % IV BOLUS (SEPSIS)
1000.0000 mL | Freq: Once | INTRAVENOUS | Status: AC
  Administered 2014-11-12: 1000 mL via INTRAVENOUS

## 2014-11-12 MED ORDER — MORPHINE SULFATE 4 MG/ML IJ SOLN
4.0000 mg | Freq: Once | INTRAMUSCULAR | Status: AC
  Administered 2014-11-12: 4 mg via INTRAVENOUS
  Filled 2014-11-12: qty 1

## 2014-11-12 MED ORDER — IOHEXOL 300 MG/ML  SOLN
75.0000 mL | Freq: Once | INTRAMUSCULAR | Status: AC | PRN
  Administered 2014-11-12: 75 mL via INTRAVENOUS

## 2014-11-12 MED ORDER — CLINDAMYCIN HCL 150 MG PO CAPS: 450.0000 mg | ORAL_CAPSULE | Freq: Three times a day (TID) | ORAL | Status: DC

## 2014-11-12 MED ORDER — ACETAMINOPHEN-CODEINE 120-12 MG/5ML PO SOLN: 10.0000 mL | ORAL | Status: DC | PRN

## 2014-11-12 MED ORDER — CLINDAMYCIN PHOSPHATE 900 MG/50ML IV SOLN
900.0000 mg | Freq: Once | INTRAVENOUS | Status: AC
  Administered 2014-11-12: 900 mg via INTRAVENOUS
  Filled 2014-11-12: qty 50

## 2014-11-12 NOTE — ED Notes (Signed)
Pt verbalizes understanding of d/c instructions and denies any further needs at this time. 

## 2014-11-12 NOTE — ED Notes (Signed)
Attempted to contact patient to verify he is taking his Rx as written, currently ED patient. Spoke w [facility] nurse, who will pass message to current nurse to inform of positive B hemolytic strep report

## 2014-11-12 NOTE — ED Provider Notes (Signed)
CSN: [id number]     Arrival date & time 11/12/14  0957 History   First MD Initiated Contact with Patient 11/12/14 1115     Chief Complaint  Patient presents with  . Shortness of Breath  . Sore Throat  . Chest Pain  . Hemoptysis     (Consider location/radiation/quality/duration/timing/severity/associated sxs/prior Treatment) HPI Patient presents to the emergency department with a one-week history of cough, nasal congestion, sore throat.  Patient states the sore throat got worse and so he went to urgent care yesterday and was diagnosed with possible strep.  He states that his pain got worse this morning so this was brought into the emergency department.  The patient states that his throat feels swollen.  The patient denies chest pain, nausea, vomiting, weakness, dizziness, headache, blurred vision, back pain, neck pain, fever, abdominal pain, dysuria, incontinence, bloody stool or syncope.  Patient states he has increased pain with swallowing Past Medical History  Diagnosis Date  . Back pain   . Kawasaki disease    History reviewed. No pertinent past surgical history. No family history on file. History  Substance Use Topics  . Smoking status: Never Smoker   . Smokeless tobacco: Not on file  . Alcohol Use: Yes    Review of Systems  All other systems negative except as documented in the HPI. All pertinent positives and negatives as reviewed in the HPI.  Allergies  Review of patient's allergies indicates no known allergies.  Home Medications   Prior to Admission medications   Medication Sig Start Date End Date Taking? Authorizing Provider  amoxicillin (AMOXIL) 500 MG capsule Take 2 capsules (1,000 mg total) by mouth 2 (two) times daily. 11/09/14  Yes Hayden Rasmussen, NP  ibuprofen (ADVIL,MOTRIN) 600 MG tablet Take 1 tablet (600 mg total) by mouth every 6 (six) hours as needed. 11/09/14  Yes Hayden Rasmussen, NP  naproxen sodium (ANAPROX) 220 MG tablet Take 440 mg by mouth daily.   Yes  Historical Provider, MD   BP 125/76 mmHg  Pulse 58  Temp(Src) 97.9 F (36.6 C) (Oral)  Resp 14  Ht  (1.854 m)  Wt 165 lb (74.844 kg)  BMI 21.77 kg/m2  SpO2 98% Physical Exam  Constitutional: He is oriented to person, place, and time. He appears well-developed and well-nourished. No distress.  HENT:  Head: Normocephalic and atraumatic.  Mouth/Throat: Uvula is midline. No oral lesions. No trismus in the jaw. Normal dentition. Uvula swelling present. No dental abscesses, lacerations or dental caries. Oropharyngeal exudate, posterior oropharyngeal edema and posterior oropharyngeal erythema present.    Eyes: Pupils are equal, round, and reactive to light.  Neck: Normal range of motion. Neck supple.  Cardiovascular: Normal rate, regular rhythm and normal heart sounds.  Exam reveals no gallop and no friction rub.   No murmur heard. Pulmonary/Chest: Effort normal and breath sounds normal. No respiratory distress.  Neurological: He is alert and oriented to person, place, and time. He exhibits normal muscle tone. Coordination normal.  Skin: Skin is warm and dry. No rash noted. No erythema.  Nursing note and vitals reviewed.   ED Course  Procedures (including critical care time) Labs Review Labs Reviewed  CBC - Abnormal; Notable for the following:    Platelets 141 (*)    All other components within normal limits  BASIC METABOLIC PANEL - Abnormal; Notable for the following:    Glucose, Bld 117 (*)    All other components within normal limits  Rosezena Sensor, ED  Imaging Review Ct Soft Tissue Neck W Contrast  11/12/2014   CLINICAL DATA:  Sore throat, hypotonia  EXAM: CT NECK WITH CONTRAST  TECHNIQUE: Multidetector CT imaging of the neck was performed using the standard protocol following the bolus administration of intravenous contrast.  CONTRAST:  78mL OMNIPAQUE IOHEXOL 300 MG/ML  SOLN  COMPARISON:  None.  FINDINGS: Prominence of the tonsillar pillars is identified. Epiglottis  appears normal. Airways patent and midline. Within the left peritonsillar soft tissues is a rim enhancing hypodense lesion measuring 1.4 x 1.1 cm. Probable reactive submental lymph nodes measuring 0.6 cm in maximal short axis diameter image 79. Salivary glands appear unremarkable. Lung apices are clear. No acute osseous finding.  IMPRESSION: Diffuse swelling of the parapharyngeal soft tissues and tonsillar pillars with rim enhancing 1.4 cm left peritonsillar lesion suggesting abscess.   Electronically Signed   By: Christiana Pellant M.D.   On: 11/12/2014 14:44     EKG Interpretation   Date/Time:  Sunday November 12 2014 10:07:10 EDT Ventricular Rate:  88 PR Interval:  142 QRS Duration: 102 QT Interval:  370 QTC Calculation: 447 R Axis:   82 Text Interpretation:  Normal sinus rhythm with sinus arrhythmia Right  atrial enlargement Borderline ECG Confirmed by ZAVITZ  MD, JOSHUA ([id number])  on 11/12/2014 11:45:59 AM      I spoke with Dr. Jearld Fenton of ENT who will see the patient in his office tomorrow.  Patient will be given oral clindamycin.  Told to return here as needed    Charlestine Night, PA-C 11/12/14 1534  Blane Ohara, MD 11/14/14 2126

## 2014-11-12 NOTE — ED Notes (Signed)
Pt reports sore throat, shortness of breath and coughing up blood Thursday. Pt seen at urgent care on Thursday. Today pt reports chest pain and continues to have the other symptoms since Thursday.

## 2014-11-12 NOTE — ED Notes (Addendum)
Per [name] urgent care called to report pt has Beta strep B. Dr. Jodi Mourning advised.

## 2014-11-12 NOTE — Discharge Instructions (Signed)
Return here as needed.  Call Dr. Jearld Fenton office at 8 AM to set up an appointment tomorrow for a follow-up for your peritonsillar abscess.

## 2014-11-13 ENCOUNTER — Inpatient Hospital Stay (HOSPITAL_COMMUNITY)
Admission: AD | Admit: 2014-11-13 | Discharge: 2014-11-17 | DRG: 866 | Disposition: A | Discharge status: Home / Self Care | Payer: BLUE CROSS/BLUE SHIELD | Source: Ambulatory Visit | Attending: Otolaryngology | Admitting: Otolaryngology

## 2014-11-13 ENCOUNTER — Encounter (HOSPITAL_COMMUNITY): Payer: Self-pay | Admitting: General Practice

## 2014-11-13 DIAGNOSIS — M303 Mucocutaneous lymph node syndrome [Kawasaki]: Secondary | ICD-10-CM | POA: Diagnosis present

## 2014-11-13 DIAGNOSIS — J039 Acute tonsillitis, unspecified: Secondary | ICD-10-CM | POA: Diagnosis present

## 2014-11-13 DIAGNOSIS — B2790 Infectious mononucleosis, unspecified without complication: Principal | ICD-10-CM | POA: Diagnosis present

## 2014-11-13 MED ORDER — PROMETHAZINE HCL 25 MG RE SUPP: 12.5000 mg | Freq: Four times a day (QID) | RECTAL | Status: DC | PRN

## 2014-11-13 MED ORDER — PROMETHAZINE HCL 25 MG PO TABS: 12.5000 mg | ORAL_TABLET | Freq: Four times a day (QID) | ORAL | Status: DC | PRN

## 2014-11-13 MED ORDER — CLINDAMYCIN PHOSPHATE 600 MG/50ML IV SOLN
600.0000 mg | Freq: Three times a day (TID) | INTRAVENOUS | Status: DC
  Administered 2014-11-13 – 2014-11-15 (×6): 600 mg via INTRAVENOUS
  Filled 2014-11-13 (×9): qty 50

## 2014-11-13 MED ORDER — IBUPROFEN 100 MG/5ML PO SUSP
400.0000 mg | Freq: Four times a day (QID) | ORAL | Status: DC | PRN
  Administered 2014-11-13 – 2014-11-14 (×3): 400 mg via ORAL
  Filled 2014-11-13 (×6): qty 20

## 2014-11-13 MED ORDER — POTASSIUM CHLORIDE IN NACL 20-0.9 MEQ/L-% IV SOLN
INTRAVENOUS | Status: DC
  Administered 2014-11-13 – 2014-11-16 (×5): via INTRAVENOUS
  Filled 2014-11-13 (×7): qty 1000

## 2014-11-13 MED ORDER — HYDROCODONE-ACETAMINOPHEN 7.5-325 MG/15ML PO SOLN
10.0000 mL | ORAL | Status: DC | PRN
  Administered 2014-11-13 – 2014-11-17 (×17): 15 mL via ORAL
  Filled 2014-11-13 (×17): qty 15

## 2014-11-13 MED ORDER — PHENOL 1.4 % MT LIQD: 1.0000 | OROMUCOSAL | Status: DC | PRN

## 2014-11-13 NOTE — H&P (Signed)
Assessment  Acute tonsillitis (463) (J03.90). Discussed  Acute necrotizing tonsillitis, and inability to take adequate p.o. liquids. We'll admit to the hospital for intravenous hydration, antibiotics, observation. Reason For Visit  Possible PTA. HPI  One-week history of severe progressive worsening sore throat. He was seen in the emergency department and given oral antibiotics. He has only voided once in the past 24 hours. No prior history. CT scan reviewed, no evidence of peritonsillar abscess. There is what appears to be an intratonsillar abscess which is not a surgical problem. Allergies  No Known Drug Allergies Amended Mason Jim  LPN; 39/53/2023 10:45 AM EST. Current Meds  Acetaminophen-Codeine 120-12 MG/5ML Oral Solution;; RPT Clindamycin HCl - 150 MG Oral Capsule;; RPT Amended : Mason Jim  LPN; 34/35/6861 10:45 AM EST. Active Problems  Acute tonsillitis   (463) (J03.90) Kawasaki disease   (446.1) (M30.3) Amended : Mason Jim  LPN; 68/37/2902 10:45 AM EST. Family Hx  No pertinent family history: Mother Amended Mason Jim  LPN; 04/10/5207 10:45 AM EST. Personal Hx  Alcohol use (V49.89) (Z78.9); 2 drinks day or fewer Never smoker No caffeine use Non-smoker (V49.89) (Z78.9) Amended : Mason Jim  LPN; 07/18/3610 10:45 AM EST. ROS  Systemic: Feeling tired (fatigue)   Amended : Mason Jim  LPN; 24/49/7530 10:45 AM EST, fever   Amended : Mason Jim  LPN; 10/03/209 10:45 AM EST, and night sweats   Amended : Mason Jim  LPN; 17/35/6701 10:45 AM EST.  No recent weight loss. Head: Headache   Amended : Mason Jim  LPN; 41/07/129 10:45 AM EST. Eyes: No eye symptoms. Otolaryngeal: No hearing loss.  Earache   Amended Mason Jim  LPN; 43/88/8757 10:45 AM EST.  No tinnitus  and no purulent nasal discharge.  Nasal passage blockage (stuffiness)   Amended : Mason Jim  LPN; 97/28/2060 10:45 AM  EST.  No snoring, no sneezing, and no hoarseness.  Sore throat   Amended : Mason Jim  LPN; 15/61/5379 10:45 AM EST. Cardiovascular: Chest pain or discomfort   Amended : Mason Jim  LPN; 43/27/6147 10:45 AM EST.  No palpitations. Pulmonary: Dyspnea   Amended : Mason Jim  LPN; 02/21/5746 10:45 AM EST.  No cough  and no wheezing. Gastrointestinal: Dysphagia   Amended : Mason Jim  LPN; 34/07/7094 10:45 AM EST.  No heartburn.  No nausea, no abdominal pain, and no melena.  No diarrhea. Genitourinary: No dysuria. Endocrine: No muscle weakness. Musculoskeletal: No calf muscle cramps, no arthralgias, and no soft tissue swelling. Neurological: No dizziness.  Fainting   Amended Mason Jim  LPN; 43/83/8184 10:46 AM EST.  No tingling  and no numbness. Psychological: No anxiety  and no depression. Skin: No rash. 12 system ROS was obtained and reviewed on the Health Maintenance form dated today.  Positive responses are shown above.  If the symptom is not checked, the patient has denied it. Vital Signs   Recorded by Mason Jim on 13 Nov 2014 09:57 AM BP:100/60,  BSA Calculated: 1.98 ,  BMI Calculated: 21.77 ,  Weight: 165 lb , BMI: 21.8 kg/m2,  Height: 6 ft 1 in. Physical Exam  APPEARANCE: Well developed, well nourished, in no acute distress.  Normal affect, in a pleasant mood.  Oriented to time, place and person. COMMUNICATION: "Hot potato" voice   HEAD & FACE:  No scars, lesions or masses of head and face.  Sinuses nontender to palpation.  Salivary glands without mass or tenderness.  Facial strength symmetric.  No facial lesion, scars, or mass. EYES: EOMI with normal primary gaze alignment. Visual acuity grossly intact.  PERRLA EXTERNAL EAR & NOSE: No scars, lesions or masses  EAC & TYMPANIC MEMBRANE:  EAC shows no obstructing lesions or debris and tympanic membranes are normal bilaterally with good movement to insufflation. GROSS HEARING: Normal    TMJ:  Nontender  INTRANASAL EXAM: No polyps or purulence.  NASOPHARYNX: Normal, without lesions. LIPS, TEETH & GUMS: No lip lesions, normal dentition and normal gums. ORAL CAVITY/OROPHARYNX: Diffusely enlarged tonsils bilaterally with necrotizing surface mucosa. There is pale edema of the uvula but is also symmetric. There is no specific edema of the soft palate mucosa. NECK:  Supple without adenopathy or mass. THYROID:  Normal with no masses palpable.  NEUROLOGIC:  No gross CN deficits. No nystagmus noted.   LYMPHATIC: Soft, mobile, mildly tender bilateral jugular adenopathy. Signature  Electronically signed by : Serena Colonel  M.D.; 11/13/2014 10:28 AM EST. Electronically signed by : Serena Colonel  M.D.; 11/13/2014 10:52 AM EST.

## 2014-11-14 DIAGNOSIS — J039 Acute tonsillitis, unspecified: Secondary | ICD-10-CM | POA: Diagnosis present

## 2014-11-14 DIAGNOSIS — R070 Pain in throat: Secondary | ICD-10-CM | POA: Diagnosis present

## 2014-11-14 DIAGNOSIS — B2790 Infectious mononucleosis, unspecified without complication: Secondary | ICD-10-CM | POA: Diagnosis present

## 2014-11-14 DIAGNOSIS — M303 Mucocutaneous lymph node syndrome [Kawasaki]: Secondary | ICD-10-CM | POA: Diagnosis present

## 2014-11-14 LAB — EPSTEIN-BARR VIRUS VCA ANTIBODY PANEL
EBV Early Antigen Ab, IgG: 88.8 U/mL — ABNORMAL HIGH (ref 0.0–8.9)
EBV VCA IgG: 80.8 U/mL — ABNORMAL HIGH (ref 0.0–17.9)
EBV VCA IgM: >160 U/mL — ABNORMAL HIGH (ref 0.0–35.9)

## 2014-11-14 MED ORDER — KETOROLAC TROMETHAMINE 15 MG/ML IJ SOLN
15.0000 mg | Freq: Four times a day (QID) | INTRAMUSCULAR | Status: DC | PRN
  Administered 2014-11-14 – 2014-11-15 (×4): 15 mg via INTRAVENOUS
  Filled 2014-11-14 (×4): qty 1

## 2014-11-14 NOTE — Progress Notes (Signed)
Patient ID: Gerald Anderson, male   DOB: 08/27/1990, 24 y.o.   MRN: [id number] Subjective: Still in pain and not taking po.  Objective: Vital signs in last 24 hours: Temp:  [97.5 F (36.4 C)-98.4 F (36.9 C)] 97.5 F (36.4 C) (06/21 0500) Pulse Rate:  [67-77] 67 (06/21 0500) Resp:  [16-17] 16 (06/21 0500) BP: (124-141)/(83-85) 137/85 mmHg (06/21 0500) SpO2:  [98 %-100 %] 100 % (06/21 0500) Weight:  [74.84 kg (164 lb 15.9 oz)] 74.84 kg (164 lb 15.9 oz) (06/20 1527) Weight change:     Intake/Output from previous day: 06/20 0701 - 06/21 0700 In: 1408.3 [P.O.:100; I.V.:1208.3; IV Piggyback:100] Out: -  Intake/Output this shift:    PHYSICAL EXAM: No changes, oral cavity moist.  Lab Results:  Recent Labs  11/12/14 1017  WBC 7.6  HGB 15.0  HCT 43.8  PLT 141*   BMET  Recent Labs  11/12/14 1017  NA 138  K 3.7  CL 104  CO2 26  GLUCOSE 117*  BUN 10  CREATININE 1.03  CALCIUM 9.2    Studies/Results: Ct Soft Tissue Neck W Contrast  11/12/2014   CLINICAL DATA:  Sore throat, hypotonia  EXAM: CT NECK WITH CONTRAST  TECHNIQUE: Multidetector CT imaging of the neck was performed using the standard protocol following the bolus administration of intravenous contrast.  CONTRAST:  44mL OMNIPAQUE IOHEXOL 300 MG/ML  SOLN  COMPARISON:  None.  FINDINGS: Prominence of the tonsillar pillars is identified. Epiglottis appears normal. Airways patent and midline. Within the left peritonsillar soft tissues is a rim enhancing hypodense lesion measuring 1.4 x 1.1 cm. Probable reactive submental lymph nodes measuring 0.6 cm in maximal short axis diameter image 79. Salivary glands appear unremarkable. Lung apices are clear. No acute osseous finding.  IMPRESSION: Diffuse swelling of the parapharyngeal soft tissues and tonsillar pillars with rim enhancing 1.4 cm left peritonsillar lesion suggesting abscess.   Electronically Signed   By: Christiana Pellant M.D.   On: 11/12/2014 14:44    Medications: I have  reviewed the patient's current medications.  Assessment/Plan: Necrotizing tonsillitis, mono testing pending. Continue IV support, await labs, monitor po intake.     Gerald Anderson 11/14/2014, 9:15 AM

## 2014-11-15 LAB — CBC WITH DIFFERENTIAL/PLATELET
BASOS PCT: 0 % (ref 0–1)
Band Neutrophils: 1 % (ref 0–10)
Basophils Absolute: 0 10*3/uL (ref 0.0–0.1)
Blasts: 0 %
Eosinophils Absolute: 0 10*3/uL (ref 0.0–0.7)
Eosinophils Relative: 0 % (ref 0–5)
HCT: 42.9 % (ref 39.0–52.0)
Hemoglobin: 14.7 g/dL (ref 13.0–17.0)
Lymphocytes Relative: 47 % — ABNORMAL HIGH (ref 12–46)
Lymphs Abs: 4.2 10*3/uL — ABNORMAL HIGH (ref 0.7–4.0)
MCH: 29.3 pg (ref 26.0–34.0)
MCHC: 34.3 g/dL (ref 30.0–36.0)
MCV: 85.5 fL (ref 78.0–100.0)
MYELOCYTES: 0 %
Metamyelocytes Relative: 1 %
Monocytes Absolute: 0.8 10*3/uL (ref 0.1–1.0)
Monocytes Relative: 9 % (ref 3–12)
NEUTROS PCT: 42 % — AB (ref 43–77)
NRBC: 0 /100{WBCs}
Neutro Abs: 4 10*3/uL (ref 1.7–7.7)
OTHER: 0 %
PLATELETS: 128 10*3/uL — AB (ref 150–400)
Promyelocytes Absolute: 0 %
RBC: 5.02 MIL/uL (ref 4.22–5.81)
RDW: 12.8 % (ref 11.5–15.5)
WBC: 9 10*3/uL (ref 4.0–10.5)

## 2014-11-15 LAB — COMPREHENSIVE METABOLIC PANEL
ALT: 65 U/L — AB (ref 17–63)
AST: 75 U/L — ABNORMAL HIGH (ref 15–41)
Albumin: 3.5 g/dL (ref 3.5–5.0)
Alkaline Phosphatase: 92 U/L (ref 38–126)
Anion gap: 8 (ref 5–15)
BILIRUBIN TOTAL: 1.1 mg/dL (ref 0.3–1.2)
BUN: 6 mg/dL (ref 6–20)
CALCIUM: 8.8 mg/dL — AB (ref 8.9–10.3)
CO2: 26 mmol/L (ref 22–32)
Chloride: 102 mmol/L (ref 101–111)
Creatinine, Ser: 0.93 mg/dL (ref 0.61–1.24)
GLUCOSE: 96 mg/dL (ref 65–99)
Potassium: 4.2 mmol/L (ref 3.5–5.1)
Sodium: 136 mmol/L (ref 135–145)
Total Protein: 6.7 g/dL (ref 6.5–8.1)

## 2014-11-15 MED ORDER — KETOROLAC TROMETHAMINE 30 MG/ML IJ SOLN
30.0000 mg | Freq: Four times a day (QID) | INTRAMUSCULAR | Status: DC
  Administered 2014-11-15 – 2014-11-17 (×7): 30 mg via INTRAVENOUS
  Filled 2014-11-15 (×7): qty 1

## 2014-11-15 NOTE — Progress Notes (Signed)
Patient ID: Gerald Anderson, male   DOB: 07/13/1990, 24 y.o.   MRN: [id number] Subjective: Feeling about the same, still unable to swallow much liquid. The Toradol provided some additional relief.  Objective: Vital signs in last 24 hours: Temp:  [98.3 F (36.8 C)-98.6 F (37 C)] 98.3 F (36.8 C) (06/22 0552) Pulse Rate:  [76-78] 78 (06/22 0552) Resp:  [17-18] 18 (06/22 0552) BP: (136-158)/(76-83) 158/83 mmHg (06/22 0552) SpO2:  [99 %] 99 % (06/22 0552) Weight change:     Intake/Output from previous day: 06/21 0701 - 06/22 0700 In: 1815 [P.O.:240; I.V.:1575] Out: -  Intake/Output this shift:    PHYSICAL EXAM: Midepigastric discomfort, no organomegaly or tenderness in the liver or spleen. Pharynx unchanged.  Lab Results:  Recent Labs  11/15/14 1112  WBC 9.0  HGB 14.7  HCT 42.9  PLT 128*   BMET  Recent Labs  11/15/14 1112  NA 136  K 4.2  CL 102  CO2 26  GLUCOSE 96  BUN 6  CREATININE 0.93  CALCIUM 8.8*    Studies/Results: No results found.  Medications: I have reviewed the patient's current medications.  Assessment/Plan: Acute infectious mononucleosis. Continue supportive therapy until he is able to drink sufficiently. We will adjust the pain medicine. Stop antibiotic.  LOS: 1 day   Gerald Anderson 11/15/2014, 2:06 PM

## 2014-11-16 MED ORDER — HYDROCODONE-ACETAMINOPHEN 7.5-325 MG/15ML PO SOLN: 15.0000 mL | Freq: Four times a day (QID) | ORAL | Status: DC | PRN

## 2014-11-16 MED ORDER — PHENOL 1.4 % MT LIQD: 2.0000 | OROMUCOSAL | Status: DC | PRN

## 2014-11-16 MED ORDER — ONDANSETRON HCL 4 MG/2ML IJ SOLN: 4.0000 mg | Freq: Four times a day (QID) | INTRAMUSCULAR | Status: DC | PRN

## 2014-11-16 NOTE — Progress Notes (Signed)
Patient ID: Gerald Anderson, male   DOB: 02/25/1991, 24 y.o.   MRN: [id number] Subjective: Doing a little better, able to eat and drink a little.  Objective: Vital signs in last 24 hours: Temp:  [98 F (36.7 C)-98.7 F (37.1 C)] 98.2 F (36.8 C) (06/23 0605) Pulse Rate:  [83-87] 87 (06/23 0605) Resp:  [17-18] 17 (06/23 0605) BP: (142-163)/(81-88) 156/81 mmHg (06/23 0605) SpO2:  [99 %-100 %] 100 % (06/23 0605) Weight change:     Intake/Output from previous day: 06/22 0701 - 06/23 0700 In: 1200 [P.O.:600; I.V.:600] Out: -  Intake/Output this shift:    PHYSICAL EXAM: No change on exam.  Lab Results:  Recent Labs  11/15/14 1112  WBC 9.0  HGB 14.7  HCT 42.9  PLT 128*   BMET  Recent Labs  11/15/14 1112  NA 136  K 4.2  CL 102  CO2 26  GLUCOSE 96  BUN 6  CREATININE 0.93  CALCIUM 8.8*    Studies/Results: No results found.  Medications: I have reviewed the patient's current medications.  Assessment/Plan: Decrease IVF, discharge when ready and taking adequate PO.   LOS: 2 days   Norvil Martensen 11/16/2014, 8:39 AM

## 2014-11-16 NOTE — Discharge Instructions (Signed)
You should not work until you are feeling better.  Take the prescription pain medicine and Motrin or Advil as needed.  Gargle with warm salt water as needed.    To whom it May Concern;  Please excuse Ayyan from work from Monday November 13, 2014 through Friday, November 24, 2014. Kyrie has been suffering with Mononucleosis and has been in the hospital. Contact me if you need any additional information. Thanks You.  Serena Colonel MD [phone]

## 2014-11-17 NOTE — Discharge Planning (Signed)
Copy of AVS given to pt who verbalizes understanding. Given rx for pain med also. Discharged amb to private car home with all personal belongings.

## 2014-11-17 NOTE — Discharge Summary (Signed)
  Physician Discharge Summary  Patient ID: Gerald Anderson MRN: [id number] DOB/AGE: 14-Dec-1990 24 y.o.  Admit date: 11/13/2014 Discharge date: 11/17/2014  Admission Diagnoses:Necrotizing tonsillitis/Mononucleosis  Discharge Diagnoses:  Active Problems:   Acute gangrenous tonsillitis   Discharged Condition: fair  Hospital Course: Slowly improved with IV fluid hydration and anelgesics  Consults: none  Significant Diagnostic Studies: EBV serology  Treatments: IV hydration  Discharge Exam: Blood pressure 137/76, pulse 79, temperature 97.9 F (36.6 C), temperature source Oral, resp. rate 18, height 6\' 1"  (1.854 m), weight 74.84 kg (164 lb 15.9 oz), SpO2 96 %. PHYSICAL EXAM: "hot potato voice", breathing well, no airway problems.  Disposition: 01-Home or Self Care     Medication List    STOP taking these medications        acetaminophen-codeine 120-12 MG/5ML solution     amoxicillin 500 MG capsule  Commonly known as:  AMOXIL     clindamycin 150 MG capsule  Commonly known as:  CLEOCIN      TAKE these medications        HYDROcodone-acetaminophen 7.5-325 mg/15 ml solution  Commonly known as:  HYCET  Take 15 mLs by mouth 4 (four) times daily as needed for moderate pain.     ibuprofen 600 MG tablet  Commonly known as:  ADVIL,MOTRIN  Take 1 tablet (600 mg total) by mouth every 6 (six) hours as needed.     naproxen sodium 220 MG tablet  Commonly known as:  ANAPROX  Take 440 mg by mouth daily.           Follow-up Information    Follow up with Serena Colonel, MD. Schedule an appointment as soon as possible for a visit in 2 weeks.   Specialty:  Otolaryngology   Contact information:   [address] [place] [address] [phone]       Signed: Serena Colonel 11/17/2014, 10:29 AM

## 2015-03-03 ENCOUNTER — Encounter (HOSPITAL_COMMUNITY): Payer: Self-pay | Admitting: Emergency Medicine

## 2015-03-03 ENCOUNTER — Emergency Department (INDEPENDENT_AMBULATORY_CARE_PROVIDER_SITE_OTHER)
Admission: EM | Admit: 2015-03-03 | Discharge: 2015-03-03 | Disposition: A | Discharge status: Home / Self Care | Payer: BLUE CROSS/BLUE SHIELD | Source: Home / Self Care

## 2015-03-03 DIAGNOSIS — L910 Hypertrophic scar: Secondary | ICD-10-CM | POA: Diagnosis not present

## 2015-03-03 DIAGNOSIS — M5489 Other dorsalgia: Secondary | ICD-10-CM | POA: Diagnosis not present

## 2015-03-03 MED ORDER — DICYCLOMINE HCL 20 MG PO TABS: 20.0000 mg | ORAL_TABLET | Freq: Two times a day (BID) | ORAL | Status: DC

## 2015-03-03 NOTE — Discharge Instructions (Signed)

## 2015-03-03 NOTE — ED Notes (Signed)
The patient presented to the [facility] with a complaint of a possible keloid scar to his right ear. The patient stated that it has been there for approximately 2 years. The patient stated that it does cause pain when touched or irritated.

## 2015-03-03 NOTE — ED Provider Notes (Signed)
CSN: [id number]     Arrival date & time 03/03/15  1300 History   None    Chief Complaint  Patient presents with  . Scar   (Consider location/radiation/quality/duration/timing/severity/associated sxs/prior Treatment) HPI Comments: Additional complaints are back pain.  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  The history is provided by the patient.    Past Medical History  Diagnosis Date  . Back pain   . 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 No pertinent past surgical history. History reviewed. No pertinent family history. Social History  Substance Use Topics  . Smoking status: Never Smoker   . Smokeless tobacco: Never Used  . Alcohol Use: Yes    Review of Systems  Constitutional: Negative.   HENT: Negative.   Eyes: Negative.   Respiratory: Negative.   Cardiovascular: Negative.   Gastrointestinal: Negative.   Endocrine: Negative.   Genitourinary: Negative.   Musculoskeletal: Positive for back pain.  Skin:       Keloid scar right ear lobe    Allergies  Review of patient's allergies indicates no known allergies.  Home Medications   Prior to Admission medications   Medication Sig Start Date End Date Taking? Authorizing Provider  HYDROcodone-acetaminophen (HYCET) 7.5-325 mg/15 ml solution Take 15 mLs by mouth 4 (four) times daily as needed for moderate pain. 11/16/14   Serena Colonel, MD  ibuprofen (ADVIL,MOTRIN) 600 MG tablet Take 1 tablet (600 mg total) by mouth every 6 (six) hours as needed. 11/09/14   Hayden Rasmussen, NP  naproxen sodium (ANAPROX) 220 MG tablet Take 440 mg by mouth daily.    Historical Provider, MD   Meds Ordered and Administered this Visit  Medications - No data to display  BP 144/81 mmHg  Pulse 57  Temp(Src) 98.3 F (36.8 C) (Oral)  Resp 18  SpO2 100% No data found.   Physical Exam  Constitutional: He appears well-developed and well-nourished.  HENT:  Head: Normocephalic and  atraumatic.  Eyes: Conjunctivae and EOM are normal. Pupils are equal, round, and reactive to light.  Neck: Normal range of motion.  Cardiovascular: Normal rate and regular rhythm.   Pulmonary/Chest: Effort normal and breath sounds normal.  Abdominal: Soft. Bowel sounds are normal.  Musculoskeletal:  TTP left LS paraspinous muscles with exaggerated pain response. Positive Waddell's sign Negative SLR No limp Normal plantar and dorsiflexion bilateral feet.  Skin:  Right ear lobe with keloid scarring.    ED Course  Procedures (including critical care time)  Labs Review Labs Reviewed - No data to display  Imaging Review No results found.   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 Referred to Dr. Terri Piedra and name and phone number given.  Explained this is chronic and will not get worse.  Chronic Low back pain - Bentyl  po bid prn back muscle spasm.  Take motrin and tylenol otc as directed.    Patient referred to PCP for follow up of chronic LBP.  He may need a referral to specialist if not better.    Deatra Canter, FNP 03/03/15 [phone]

## 2015-03-17 ENCOUNTER — Encounter (HOSPITAL_COMMUNITY): Payer: Self-pay

## 2015-03-17 ENCOUNTER — Emergency Department (HOSPITAL_COMMUNITY)
Admission: EM | Admit: 2015-03-17 | Discharge: 2015-03-17 | Disposition: A | Discharge status: Home / Self Care | Payer: BLUE CROSS/BLUE SHIELD | Attending: Emergency Medicine | Admitting: Emergency Medicine

## 2015-03-17 DIAGNOSIS — Z79899 Other long term (current) drug therapy: Secondary | ICD-10-CM | POA: Diagnosis not present

## 2015-03-17 DIAGNOSIS — K5900 Constipation, unspecified: Secondary | ICD-10-CM | POA: Diagnosis not present

## 2015-03-17 DIAGNOSIS — K602 Anal fissure, unspecified: Secondary | ICD-10-CM | POA: Diagnosis not present

## 2015-03-17 DIAGNOSIS — Z8739 Personal history of other diseases of the musculoskeletal system and connective tissue: Secondary | ICD-10-CM | POA: Insufficient documentation

## 2015-03-17 DIAGNOSIS — K649 Unspecified hemorrhoids: Secondary | ICD-10-CM | POA: Diagnosis not present

## 2015-03-17 MED ORDER — IBUPROFEN 800 MG PO TABS: 800.0000 mg | ORAL_TABLET | Freq: Three times a day (TID) | ORAL | Status: DC

## 2015-03-17 MED ORDER — HYDROCORTISONE ACETATE 25 MG RE SUPP
25.0000 mg | Freq: Once | RECTAL | Status: AC
  Administered 2015-03-17: 25 mg via RECTAL
  Filled 2015-03-17: qty 1

## 2015-03-17 MED ORDER — IBUPROFEN 800 MG PO TABS
800.0000 mg | ORAL_TABLET | Freq: Once | ORAL | Status: AC
Start: 2015-03-17 — End: 2015-03-17
  Administered 2015-03-17: 800 mg via ORAL
  Filled 2015-03-17: qty 1

## 2015-03-17 MED ORDER — POLYETHYLENE GLYCOL 3350 17 GM/SCOOP PO POWD: 17.0000 g | Freq: Two times a day (BID) | ORAL | Status: DC

## 2015-03-17 MED ORDER — STARCH 51 % RE SUPP: 1.0000 | RECTAL | Status: DC | PRN

## 2015-03-17 NOTE — Discharge Instructions (Signed)
1. Medications: anusol, ibuprofen, miralax, usual home medications 2. Treatment: rest, drink plenty of fluids, sitz baths 3. Follow Up: Please followup with your primary doctor in 2 days for discussion of your diagnoses and further evaluation after today's visit; if you do not have a primary care doctor use the resource guide provided to find one; Please return to the ER for worsening pain, fevers, abd pain, other concerns   How to Take a Sitz Bath A sitz bath is a warm water bath that is taken while you are sitting down. The water should only come up to your hips and should cover your buttocks. Your health care provider may recommend a sitz bath to help you:   Clean the lower part of your body, including your genital area.  With itching.  With pain.  With sore muscles or muscles that tighten or spasm. HOW TO TAKE A SITZ BATH Take 3-4 sitz baths per day or as told by your health care provider.  Partially fill a bathtub with warm water. You will only need the water to be deep enough to cover your hips and buttocks when you are sitting in it.  If your health care provider told you to put medicine in the water, follow the directions exactly.  Sit in the water and open the tub drain a little.  Turn on the warm water again to keep the tub at the correct level. Keep the water running constantly.  Soak in the water for 15-20 minutes or as told by your health care provider.  After the sitz bath, pat the affected area dry first. Do not rub it.  Be careful when you stand up after the sitz bath because you may feel dizzy. SEEK MEDICAL CARE IF:  Your symptoms get worse. Do not continue with sitz baths if your symptoms get worse.  You have new symptoms. Do not continue with sitz baths until you talk with your health care provider.   This information is not intended to replace advice given to you by your health care provider. Make sure you discuss any questions you have with your health care  provider.   Document Released: 02/02/2004 Document Revised: 09/26/2014 Document Reviewed: 05/10/2014 Elsevier Interactive Patient Education 2016 [employer].   Hemorrhoids Hemorrhoids are swollen veins around the rectum or anus. There are two types of hemorrhoids:   Internal hemorrhoids. These occur in the veins just inside the rectum. They may poke through to the outside and become irritated and painful.  External hemorrhoids. These occur in the veins outside the anus and can be felt as a painful swelling or hard lump near the anus. CAUSES  Pregnancy.   Obesity.   Constipation or diarrhea.   Straining to have a bowel movement.   Sitting for long periods on the toilet.  Heavy lifting or other activity that caused you to strain.  Anal intercourse. SYMPTOMS   Pain.   Anal itching or irritation.   Rectal bleeding.   Fecal leakage.   Anal swelling.   One or more lumps around the anus.  DIAGNOSIS  Your caregiver may be able to diagnose hemorrhoids by visual examination. Other examinations or tests that may be performed include:   Examination of the rectal area with a gloved hand (digital rectal exam).   Examination of anal canal using a small tube (scope).   A blood test if you have lost a significant amount of blood.  A test to look inside the colon (sigmoidoscopy or colonoscopy). TREATMENT  Most hemorrhoids can be treated at home. However, if symptoms do not seem to be getting better or if you have a lot of rectal bleeding, your caregiver may perform a procedure to help make the hemorrhoids get smaller or remove them completely. Possible treatments include:   Placing a rubber band at the base of the hemorrhoid to cut off the circulation (rubber band ligation).   Injecting a chemical to shrink the hemorrhoid (sclerotherapy).   Using a tool to burn the hemorrhoid (infrared light therapy).   Surgically removing the hemorrhoid (hemorrhoidectomy).    Stapling the hemorrhoid to block blood flow to the tissue (hemorrhoid stapling).  HOME CARE INSTRUCTIONS   Eat foods with fiber, such as whole grains, beans, nuts, fruits, and vegetables. Ask your doctor about taking products with added fiber in them (fibersupplements).  Increase fluid intake. Drink enough water and fluids to keep your urine clear or pale yellow.   Exercise regularly.   Go to the bathroom when you have the urge to have a bowel movement. Do not wait.   Avoid straining to have bowel movements.   Keep the anal area dry and clean. Use wet toilet paper or moist towelettes after a bowel movement.   Medicated creams and suppositories may be used or applied as directed.   Only take over-the-counter or prescription medicines as directed by your caregiver.   Take warm sitz baths for 15-20 minutes, 3-4 times a day to ease pain and discomfort.   Place ice packs on the hemorrhoids if they are tender and swollen. Using ice packs between sitz baths may be helpful.   Put ice in a plastic bag.   Place a towel between your skin and the bag.   Leave the ice on for 15-20 minutes, 3-4 times a day.   Do not use a donut-shaped pillow or sit on the toilet for long periods. This increases blood pooling and pain.  SEEK MEDICAL CARE IF:  You have increasing pain and swelling that is not controlled by treatment or medicine.  You have uncontrolled bleeding.  You have difficulty or you are unable to have a bowel movement.  You have pain or inflammation outside the area of the hemorrhoids. MAKE SURE YOU:  Understand these instructions.  Will watch your condition.  Will get help right away if you are not doing well or get worse.   This information is not intended to replace advice given to you by your health care provider. Make sure you discuss any questions you have with your health care provider.   Document Released: 05/09/2000 Document Revised: 04/28/2012  Document Reviewed: 03/16/2012 Elsevier Interactive Patient Education 2016 [employer].   Emergency Department Resource Guide 1) Find a Doctor and Pay Out of Pocket Although you won't have to find out who is covered by your insurance plan, it is a good idea to ask around and get recommendations. You will then need to call the office and see if the doctor you have chosen will accept you as a new patient and what types of options they offer for patients who are self-pay. Some doctors offer discounts or will set up payment plans for their patients who do not have insurance, but you will need to ask so you aren't surprised when you get to your appointment.  2) Contact Your Local Health Department Not all health departments have doctors that can see patients for sick visits, but many do, so it is worth a call to see if yours does.  If you don't know where your local health department is, you can check in your phone book. The CDC also has a tool to help you locate your state's health department, and many state websites also have listings of all of their local health departments.  3) Find a Walk-in Clinic If your illness is not likely to be very severe or complicated, you may want to try a walk in clinic. These are popping up all over the country in pharmacies, drugstores, and shopping centers. They're usually staffed by nurse practitioners or physician assistants that have been trained to treat common illnesses and complaints. They're usually fairly quick and inexpensive. However, if you have serious medical issues or chronic medical problems, these are probably not your best option.  No Primary Care Doctor: - Call Health Connect at  [phone] - they can help you locate a primary care doctor that  accepts your insurance, provides certain services, etc. - Physician Referral Service- [phone]  Chronic Pain Problems: Organization         Address  Phone   Notes  [facility]  [phone] Patients need to be referred by their primary care doctor.   Medication Assistance: Organization         Address  Phone   Notes  [facility] Medication [facility] [address] [place] [place], [place] [address] [phone] --Must be a resident of [facility] -- Must have NO insurance coverage whatsoever (no Medicaid/ Medicare, etc.) -- The pt. MUST have a primary care doctor that directs their care regularly and follows them in the community   [facility]  [phone]   [employer]  [phone]    Agencies that provide inexpensive medical care: Organization         Address  Phone   Notes  [facility]  [phone]   [facility]    [phone]   [facility] [address] [place], [place] [address] [phone]   [facility] [address] [place], [place] [phone]   Planned Parenthood    [phone]   [facility]    [phone]   Community Health and [facility]  [address], [place] Phone:  [phone], Fax:  [phone] Hours of Operation:  9 am - 6 pm, M-F.  Also accepts Medicaid/Medicare and self-pay.  [facility]  [address], [place] Phone: [phone], Fax: [phone]. Hours of Operation:  8:30 am - 5:30 pm, M-F.  Also accepts Medicaid and self-pay.  [facility] [place] [address] [place] Phone: [phone]   [facility] [address] Natasha Bence [place], [place] [phone], Ext. [phone] Mondays & Thursdays: 7-9 AM.  First 15 patients are seen on a first come, first serve basis.    Medicaid-accepting [facility] Providers:  Organization         Address  Phone   Notes  [facility] [address], [place] [phone] Also accepts self-pay patients.  [facility] [address] Laurell Josephs [place], [place]   [phone]   [facility] [address], [place] [phone]   [facility] [address] [place] [phone]   Renaye Rakers [address], [place]   [phone] Only  accepts [place] [employer] patients after they have their name applied to their card.   Self-Pay (no insurance) in [facility]:  Organization         Address  Phone   Notes  Sickle Cell Patients, [facility] [address] [place], [place] [phone]   [facility] [address] [place], [place] [phone]   [facility] [place]  [address], [place] [phone]   [facility]/Dr. Osei-Bonsu  [address] [place] or [address], [place] [phone] Phone number for both [place] and [place] locations is the same.  Urgent Medical and [facility] [address], [place] [phone]   [facility] [address], [place] or [address] [phone]   [facility] [address], [place] [phone], phone; [phone], fax Sees patients 1st and 3rd Saturday of every month.  Must not qualify for public or private insurance (i.e. Medicaid, Medicare, [place] Health Choice, Veterans' Benefits)  Household income should be no more than 200% of the poverty level The clinic cannot treat you if you are pregnant or think you are pregnant  Sexually transmitted diseases are not treated at the clinic.    Dental Care: Organization         Address  Phone  Notes  [facility] Department of [facility] [address] [place], [place] [phone] Accepts children up to age 43 who are enrolled in [place] or [place] Health Choice; pregnant women with a Medicaid card; and children who have applied for Medicaid or [place] Health Choice, but  were declined, whose parents can pay a reduced fee at time of service.  [facility] Department of [facility]  [address], [place] [phone] Accepts children up to age 6 who are enrolled in [place] or [place] Health Choice; pregnant women with a Medicaid card; and children who have applied for Medicaid or [place] Health Choice, but were declined, whose parents can pay a reduced fee at time of service.  [facility]  [address] [place], [place] [phone] Patients are seen by appointment only. Walk-ins are not accepted. [facility] will see patients 45 years of age and older. Monday - Tuesday (8am-5pm) Most Wednesdays (8:30-5pm) $30 per visit, cash only  [facility]  [address], [facility] [phone] Patients are seen by appointment only. Walk-ins are not accepted. [facility] will see patients 85 years of age and older. One Wednesday Evening (Monthly: Volunteer Based).  $30 per visit, cash only  [employer]  [phone] for adults; Children under age 5, call Graduate Pediatric Dentistry at [phone]. Children aged 6-14, please call [phone] to request a pediatric application.  Dental services are provided in all areas of dental care including fillings, crowns and bridges, complete and partial dentures, implants, gum treatment, root canals, and extractions. Preventive care is also provided. Treatment is provided to both adults and children. Patients are selected via a lottery and there is often a waiting list.   [facility] [address], [place]  [phone] [email or web address]   [facility] [address] [place], [place] [phone], Ext. [phone] Second and Fourth Thursday of each month, opens at 6:30 AM; Clinic ends at  9 AM.  Patients are seen on a first-come first-served basis, and a limited number are seen during each clinic.    [facility]  [address], [place], [place] [phone]   Eligibility Requirements You must have lived in [place], [place], or [place] counties for at least the last three months.   You cannot be eligible for state or federal sponsored [place]healthcare insurance, including CIG[employer]eterans Administration, [place]icaid, or [employer]icare.   You generally cannot be eligible for healthcare insurance through your employer.    How to apply: Eligibility screenings are held every Tuesday and Wednesday afternoon from 1:00 pm until 4:00 pm. You do not need an appointment for the interview!  [facility] [address], [place], [place] [phone]   [facility]  [phone]   [facility]  [phone]   [facility]  [phone]    Behavioral Health Resources in the Community: Intensive Outpatient Programs Organization         Address  Phone  Notes  [facility] [address], [place], [place] [phone]   [facility] [address], [place], [place] [phone]   [facility] [address], [place], [place]  [phone]   [facility] [address],  [place], [place] [phone] or [phone]   Substance Abuse Resources Organization         Address  Phone  Notes  [facility]  [phone]   [facility]  [phone]   [facility]  [phone]   [facility]  [phone]   Residential & Outpatient Substance Abuse Program  [phone]   Psychological Services Organization         Address  Phone  Notes  [facility]  [phone]- [phone]   [facility]  [phone]   [facility] [address], [place] [phone] or [phone]    Mobile Crisis Teams Organization         Address  Phone  Notes  [facility], Mobile Crisis Care  Unit  [phone]   [facility]  [address] [place], [place] [phone]   Doristine LocksSharon DeEsch [address] [place] [phone]    Self-Help/Support Groups Organization         Address  Phone             Notes  [facility] [place] - variety of support groups  336- [phone] Call for more information  Narcotics Anonymous (NA), [facility] [address], [place]  2 meetings at this location   Statisticianesidential Treatment Programs Organization         Address  Phone  Notes  [facility] [address],    [place]  [phone]   [facility]  [address], [place] [phone], [place], [place] [phone]   [facility] [address], [place] [phone] Admissions: 8am-3pm M-F  [facility] [address]    [place], [place] [phone]   [facility] [address], [place], [place] [phone]   [facility] [address]  [place], [place] [phone]   [facility] - Intensive Outpatient [address], [place], [place] [phone]   [facility] ([facility] [address]  [place], [place] [phone] or [phone]   [facility]  Services (RT[facility]) [address], [place], [place] [phone] Accepts Medicaid  [facility] [address],  [place] [phone] Substance Abuse/Addiction Treatment   [facility] Organization         Address  Phone  Notes  [facility]  [phone]   Angie Fava, PhD [address] Ervin Knack [place], [place]   [phone] or [phone]   [facility]   [address] [place], [place] [phone]   [facility] [address], [place], [place] [phone] Insurance/Medicaid/sponsorship through [facility] and Families [address]                                     [place], [place] [phone] Therapy/tele-psych/case  [facility] [address], [place] [phone]    Dr. Lolly Mustache  [phone]   [facility]  [employer] [facility] Dept. 1) [address], [place] 2) [address], [place] 3)  [address], [place] [phone]  [phone]   [facility] Child Abuse Hotline 714-002-2379 or [phone] (After Hours)

## 2015-03-17 NOTE — ED Provider Notes (Signed)
CSN: [id number]     Arrival date & time 03/17/15  4098 History   First MD Initiated Contact with Patient 03/17/15 [id number]     Chief Complaint  Patient presents with  . Hemorrhoids     (Consider location/radiation/quality/duration/timing/severity/associated sxs/prior Treatment) The history is provided by the patient and medical records. No language interpreter was used.     Gerald Anderson is a 24 y.o. male  with a hx of back pain, Kawasaki disease presents to the Emergency Department complaining of gradual, persistent, progressively worsening rectal pain onset 3 days ago with acute worsening ins the last 12 hours.  He reports a hx of hemorrhoids, but never with pain like this.  Last BM was this morning around 2am and it was hard and previous to that pt reports he cannot remember.  Pt reports he has been constipated for a while requiring straining with defecation.  Pt denies fevers, chills, abd pain, N/V/D, weakness, dizziness, syncope, dysuria.  Pt denies hx of rectal abscess and denies anal sex.  Pt has used Preparation H without relief.  Pt denies melena, hematochezia or blood during or after BMs.     Past Medical History  Diagnosis Date  . Back pain   . Kawasaki disease [facility])    History reviewed. No pertinent past surgical history. No family history on file. Social History  Substance Use Topics  . Smoking status: Never Smoker   . Smokeless tobacco: Never Used  . Alcohol Use: Yes    Review of Systems  Constitutional: Negative for fever, diaphoresis, appetite change, fatigue and unexpected weight change.  HENT: Negative for mouth sores.   Eyes: Negative for visual disturbance.  Respiratory: Negative for cough, chest tightness, shortness of breath and wheezing.   Cardiovascular: Negative for chest pain.  Gastrointestinal: Negative for nausea, vomiting, abdominal pain, diarrhea and constipation.       Rectal pain  Endocrine: Negative for polydipsia, polyphagia and polyuria.    Genitourinary: Negative for dysuria, urgency, frequency and hematuria.  Musculoskeletal: Negative for back pain and neck stiffness.  Skin: Negative for rash.  Allergic/Immunologic: Negative for immunocompromised state.  Neurological: Negative for syncope, light-headedness and headaches.  Hematological: Does not bruise/bleed easily.  Psychiatric/Behavioral: Negative for sleep disturbance. The patient is not nervous/anxious.       Allergies  Review of patient's allergies indicates no known allergies.  Home Medications   Prior to Admission medications   Medication Sig Start Date End Date Taking? Authorizing Provider  dicyclomine (BENTYL) 20 MG tablet Take 1 tablet (20 mg total) by mouth 2 (two) times daily. 03/03/15   Deatra Canter, FNP  ibuprofen (ADVIL,MOTRIN) 800 MG tablet Take 1 tablet (800 mg total) by mouth 3 (three) times daily. 03/17/15   Sharea Guinther, PA-C  polyethylene glycol powder (GLYCOLAX/MIRALAX) powder Take 17 g by mouth 2 (two) times daily. 03/17/15   Camillo Quadros, PA-C  starch (ANUSOL) 51 % suppository Place 1 suppository rectally as needed for pain. 03/17/15   Karenna Romanoff, PA-C   BP 121/66 mmHg  Pulse 52  Temp(Src) 97.5 F (36.4 C)  Resp 18  Ht  (1.854 m)  Wt 155 lb (70.308 kg)  BMI 20.45 kg/m2  SpO2 100% Physical Exam  Constitutional: He appears well-developed and well-nourished. No distress.  Awake, alert, nontoxic appearance  HENT:  Head: Normocephalic and atraumatic.  Mouth/Throat: Oropharynx is clear and moist. No oropharyngeal exudate.  Eyes: Conjunctivae are normal. No scleral icterus.  Neck: Normal range of motion. Neck supple.  Cardiovascular: Normal rate, regular rhythm and intact distal pulses.   Pulmonary/Chest: Effort normal and breath sounds normal. No respiratory distress. He has no wheezes.  Equal chest expansion  Abdominal: Soft. Bowel sounds are normal. He exhibits no mass. There is no tenderness. There is no  rebound and no guarding. Hernia confirmed negative in the right inguinal area and confirmed negative in the left inguinal area.  Genitourinary: Prostate normal, testes normal and penis normal. Guaiac negative stool. Prostate is not enlarged and not tender.     Guaiac negative No tenderness to palpation of the perineum, scrotum or tissue surrounding the rectum DRE with evidence of small fissure associated with the hemorrhoid and small internal hemorrhoid palpable Prostate is not enlarged or boggy  Musculoskeletal: Normal range of motion. He exhibits no edema.  Lymphadenopathy:       Right: No inguinal adenopathy present.       Left: No inguinal adenopathy present.  Neurological: He is alert.  Speech is clear and goal oriented Moves extremities without ataxia  Skin: Skin is warm and dry. He is not diaphoretic.  Psychiatric: He has a normal mood and affect.  Nursing note and vitals reviewed.   ED Course  Procedures (including critical care time) Labs Review Labs Reviewed - No data to display  Imaging Review No results found. I have personally reviewed and evaluated these images and lab results as part of my medical decision-making.   EKG Interpretation None      MDM   Final diagnoses:  Constipation, unspecified constipation type  Hemorrhoids, unspecified hemorrhoid type  Anal fissure   Gerald Anderson presents with constipation and evidence of hemorrhoid on exam. Patient denies rectal bleeding and guaiac is negative. No evidence of abscess or Fournier's gangrene. We'll begin treating with Anusol. Discussed conservative treatment including sitz baths. Patient discharged with condition for follow-up in 48 hours and reasons to return to emergency department sooner including fevers, abdominal pain, worsening pain discussed.  BP 121/66 mmHg  Pulse 52  Temp(Src) 97.5 F (36.4 C)  Resp 18  Ht 6\' 1"  (1.854 m)  Wt 155 lb (70.308 kg)  BMI 20.45 kg/m2  SpO2 100%   Dierdre ForthHannah  Beila Purdie, PA-C 03/17/15 0740  Tomasita CrumbleAdeleke Oni, MD 03/17/15 1515

## 2015-03-17 NOTE — ED Notes (Signed)
Pt c/o of hemorrhoids pain, worse in the past two days, not relieved by over the counter medications.

## 2016-01-10 IMAGING — CR DG CHEST 2V
2 series · 2 of 2 positions shown · non-contrast
Comparison: None.

CLINICAL DATA: Nausea, vomiting, diarrhea, shortness of breath, mid
chest pain.

EXAM:
CHEST  2 VIEW

[chest lat]
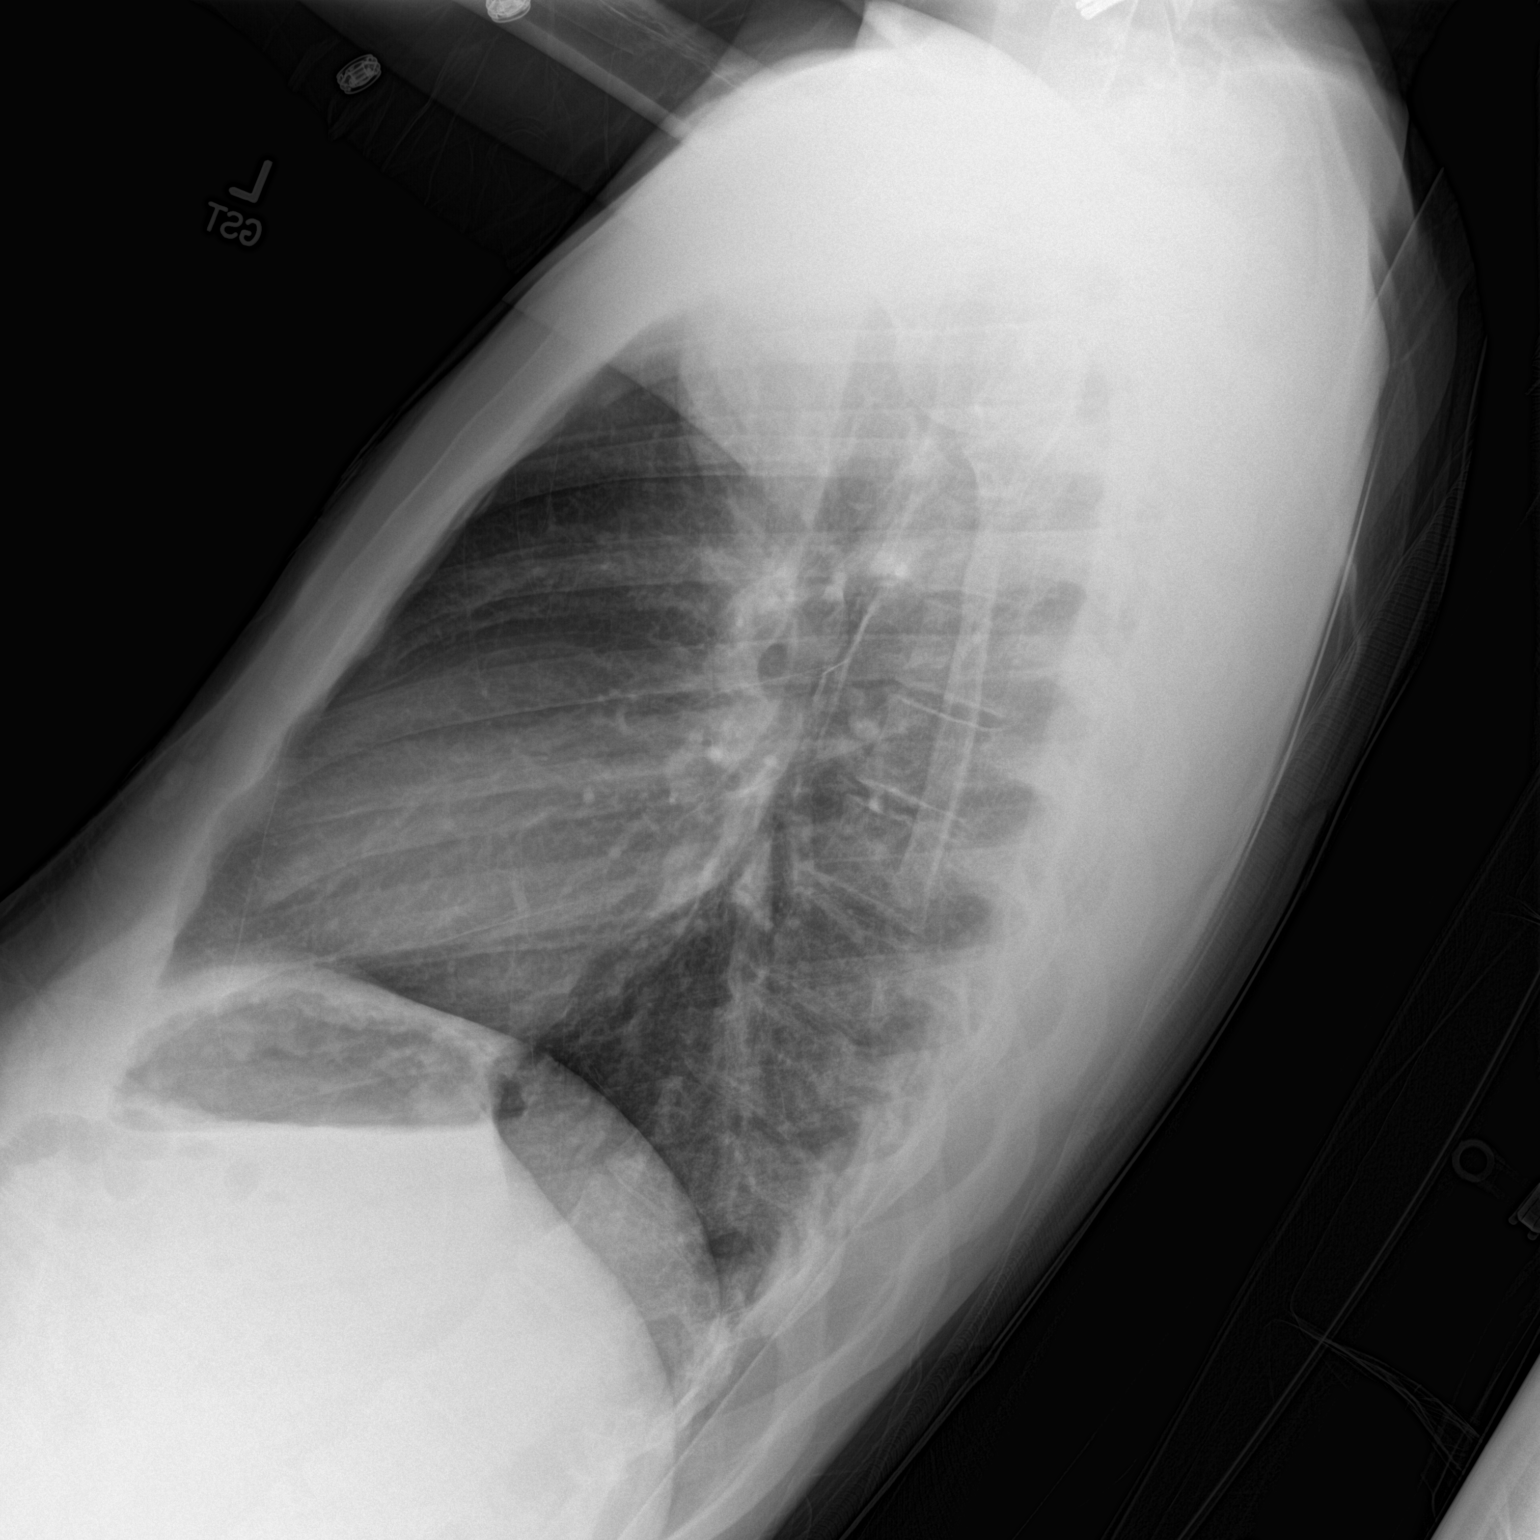

[chest ap]
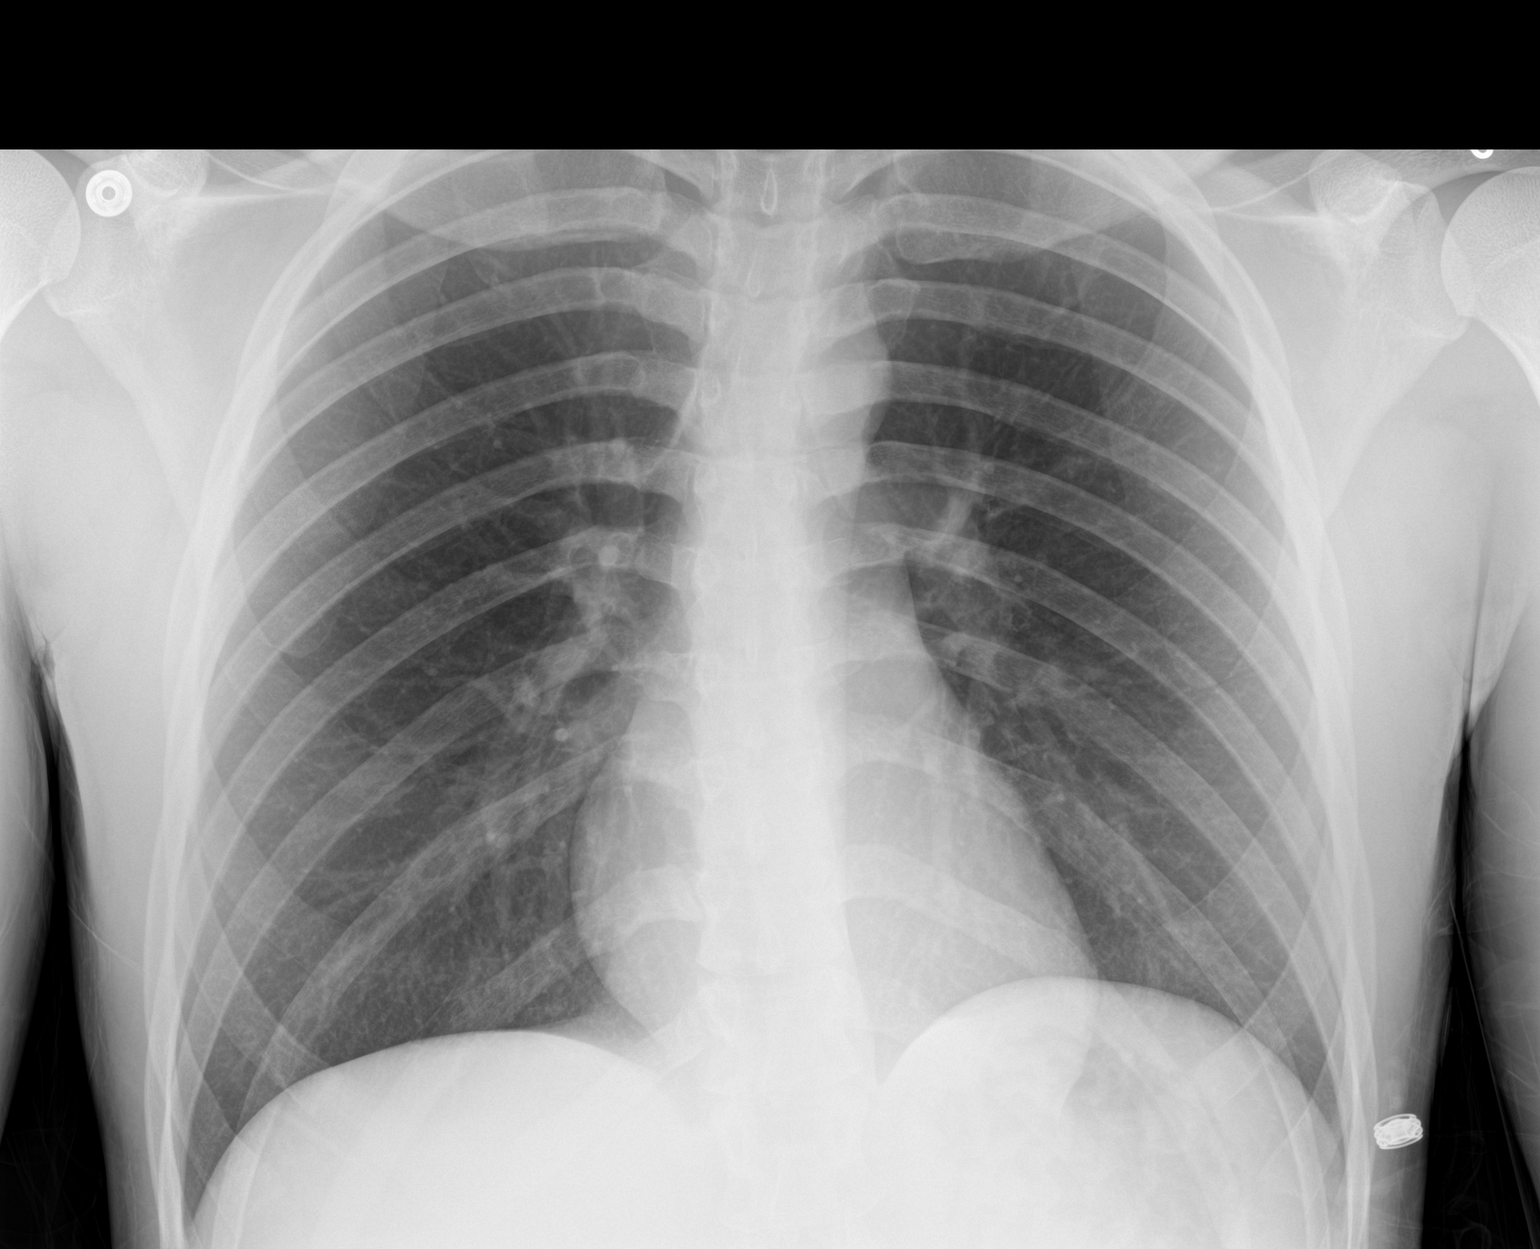

[2 of 2 positions shown; findings below may reference images not displayed]

FINDINGS: Cardiomediastinal silhouette is unremarkable. The lungs are clear
without pleural effusions or focal consolidations. Trachea projects
midline and there is no pneumothorax. Soft tissue planes and
included osseous structures are non-suspicious.
IMPRESSION: Normal chest.

  By: Rantona Bhebhe

## 2016-01-10 IMAGING — CT CT ABD-PELV W/ CM
2 of 4 series · 13 of 36 positions shown, 16 images · IV contrast (Omni 300)
Comparison: Chest radiographs 7557 hours today.

CLINICAL DATA: 23-year-old male with sudden onset chest pain,
periumbilical pain, nausea vomiting diarrhea. Lumbar back pain
radiating to both lower extremities. Initial encounter.

EXAM:
CT CHEST, ABDOMEN, AND PELVIS WITH CONTRAST
TECHNIQUE: Multidetector CT imaging of the chest, abdomen and pelvis was
performed following the standard protocol during bolus
administration of intravenous contrast.
CONTRAST:  80mL OMNIPAQUE IOHEXOL 300 MG/ML  SOLN

[Series 2: cap 5.0 i31f 1 · axial · 0.65mm/px · z∈[+763,+1358]mm · 10 of 134 slices shown, 13 images]
[im 8/134  mediastinal]
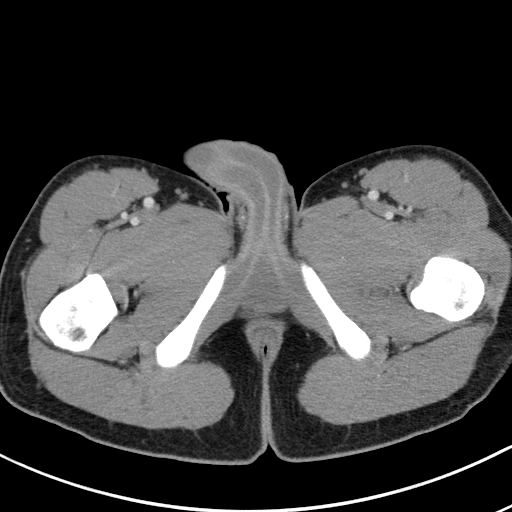
[im 8/134  lung]
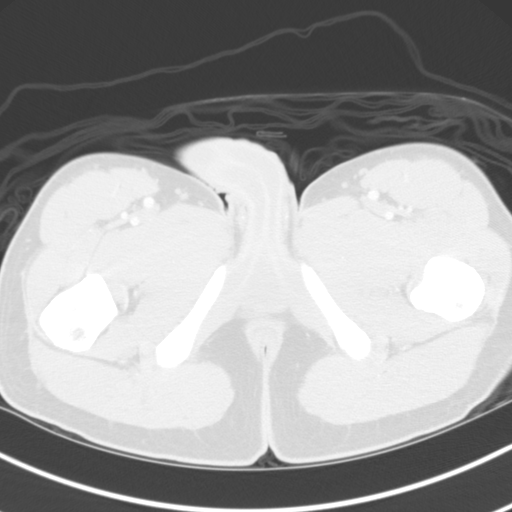
[im 22/134  lung]
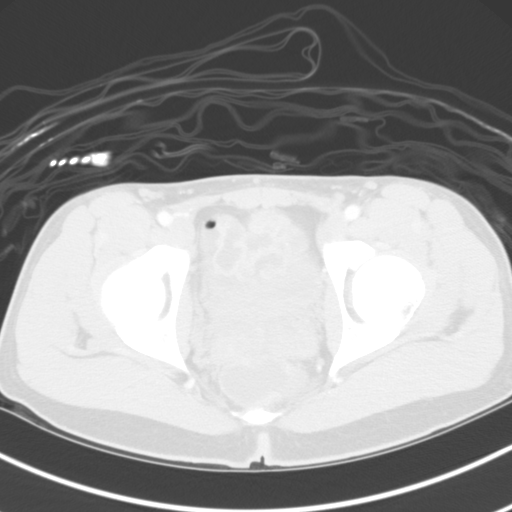
[im 36/134  lung]
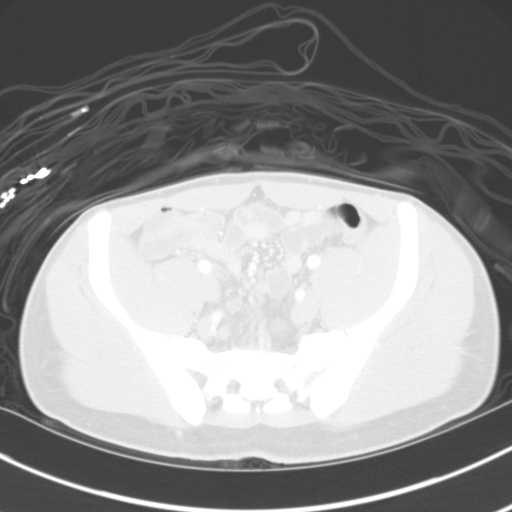
[im 50/134  lung]
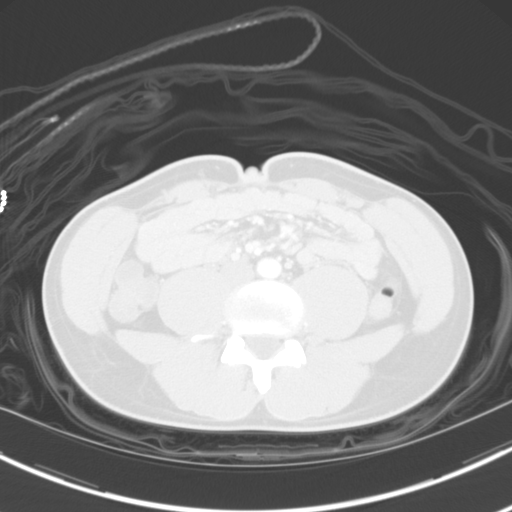
[im 64/134  mediastinal]
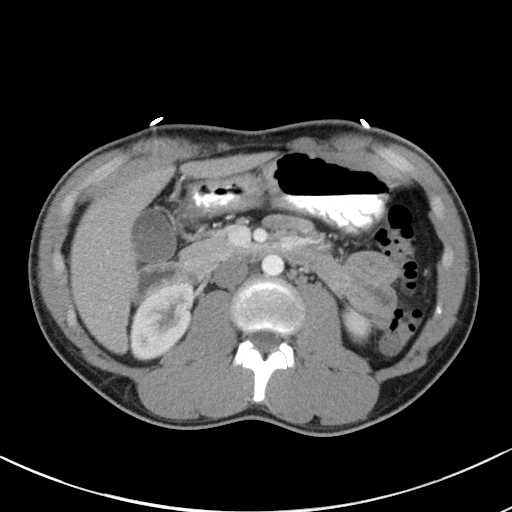
[im 64/134  lung]
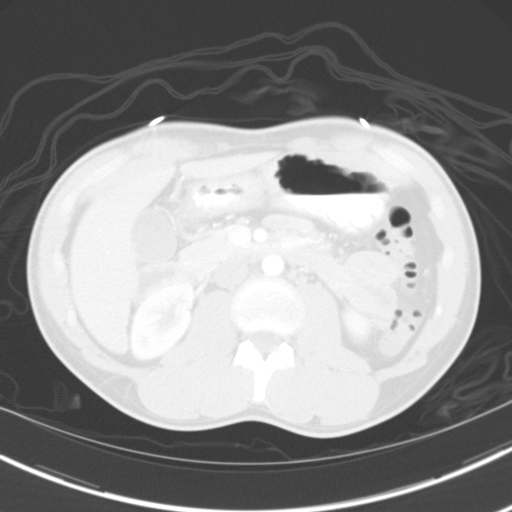
[im 71/134  lung]
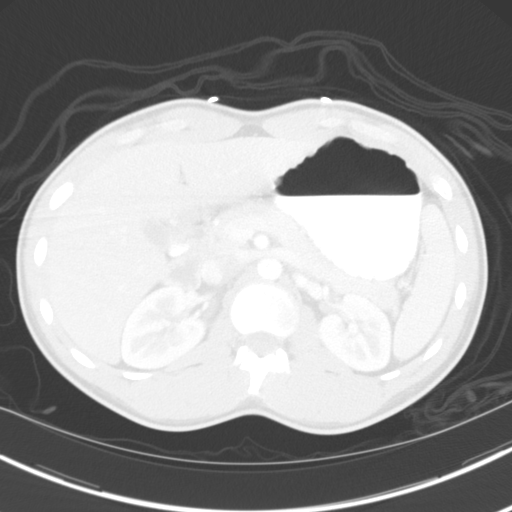
[im 85/134  lung]
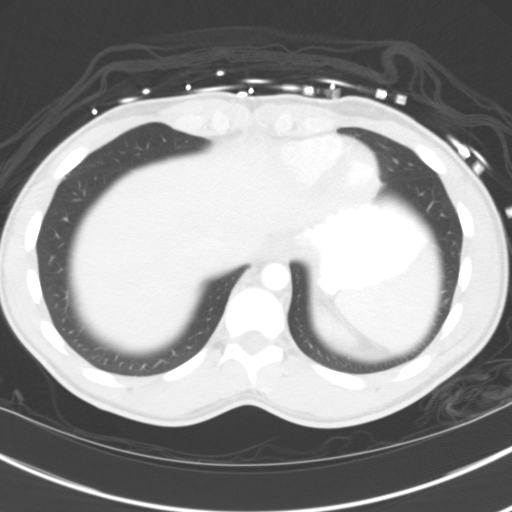
[im 99/134  lung]
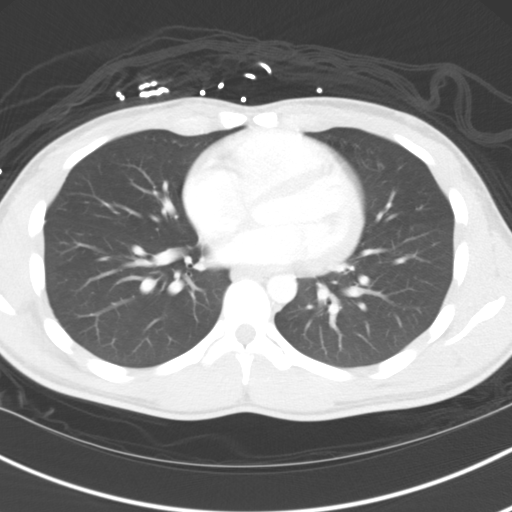
[im 113/134  mediastinal]
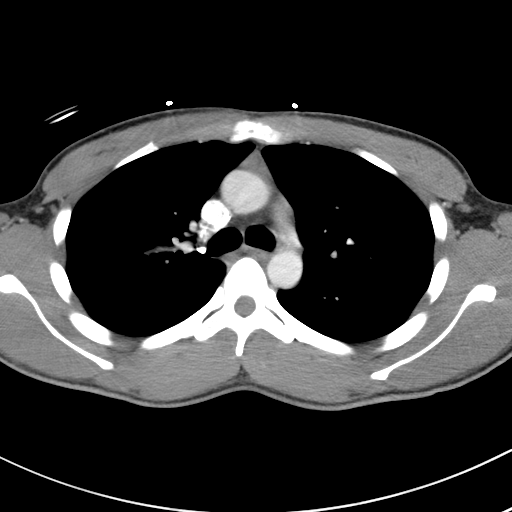
[im 113/134  lung]
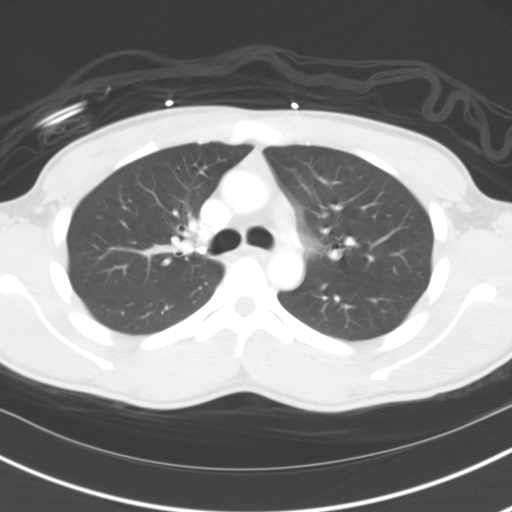
[im 127/134  lung]
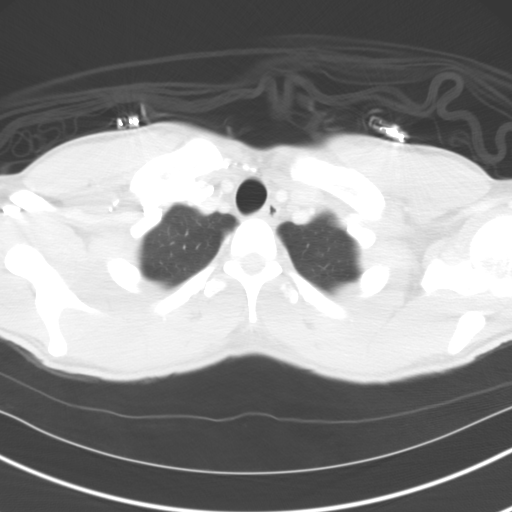

[Series 5: coronal · coronal · 0.80mm/px · 3 of 97 slices shown]
[im 20/97  lung]
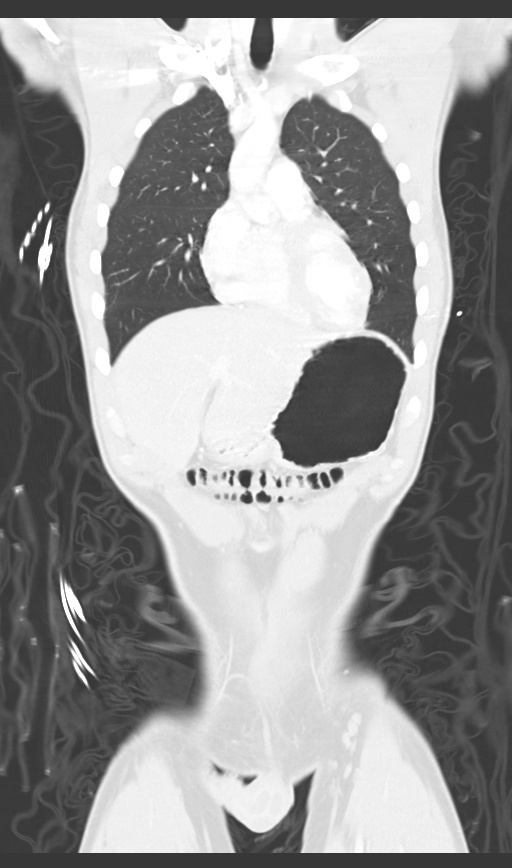
[im 39/97  lung]
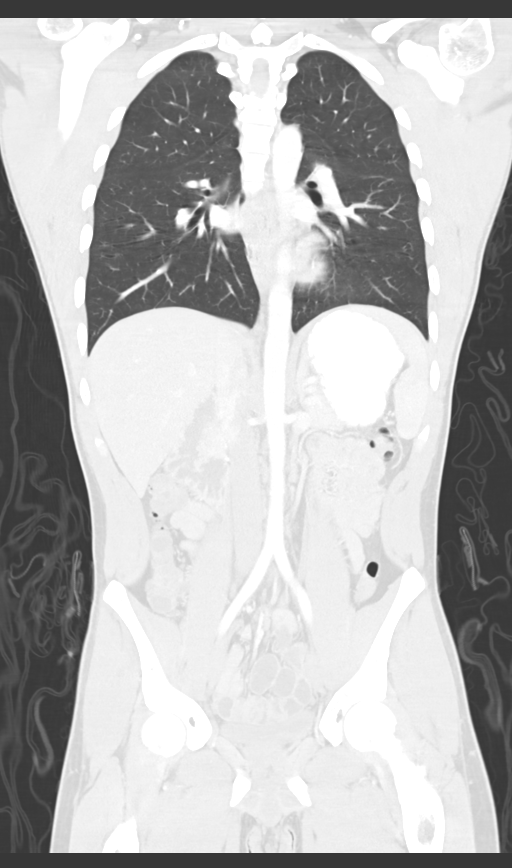
[im 58/97  lung]
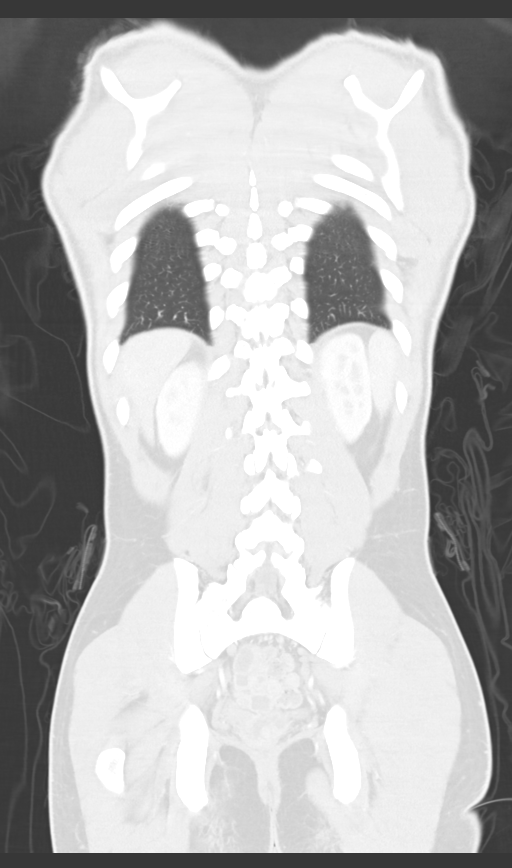

[13 of 36 positions shown; findings below may reference images not displayed]

FINDINGS: CT CHEST FINDINGS

Major airways are patent. Both lungs are clear. No pneumothorax or
pleural effusion.

No pericardial effusion. No mediastinal lymphadenopathy. Small
volume residual thymus in the anterior superior mediastinum.

Negative thoracic inlet. Small volume contrast in the thoracic
esophagus. Major mediastinal vascular structures appear normal.

No osseous abnormality identified.

CT ABDOMEN AND PELVIS FINDINGS

Mild lower lumbar disc bulging.  No osseous abnormality identified.

Fluid in the distal colon. No pelvic free fluid identified.
Diminutive bladder.

Fluid in the sigmoid colon. The left colon is mostly decompressed.
Likewise the transverse colon is mostly decompressed. Fluid in the
right colon. Negative terminal ileum. The mid portion of the
appendix contains fluid and is mildly distended to 7-8 mm, but the
tip of the appendix appears normal and contains gas, and the base of
the appendix is nondilated. No surrounding inflammation.

Fluid-filled but nondilated distal small bowel. No dilated loops.
There is oral contrast distending the stomach. Negative duodenum.

Liver, gallbladder, spleen, pancreas, adrenal glands, and kidneys
are normal. Portal venous system is patent. Major arterial
structures are normal. No abdominal free fluid or free air. No
lymphadenopathy identified.
IMPRESSION: 1. Negative chest CT.
2. Fluid in nondilated large and small bowel compatible with
enteritis/diarrhea. No obstruction, free fluid or free air.
3. The mid portion of the appendix is mildly distended with fluid,
but the appendix elsewhere is normal and there is no strong evidence
of acute appendicitis.

## 2016-03-18 ENCOUNTER — Emergency Department (HOSPITAL_COMMUNITY): Payer: Medicaid - Out of State

## 2016-03-18 ENCOUNTER — Encounter (HOSPITAL_COMMUNITY): Payer: Self-pay | Admitting: Emergency Medicine

## 2016-03-18 ENCOUNTER — Emergency Department (HOSPITAL_COMMUNITY)
Admission: EM | Admit: 2016-03-18 | Discharge: 2016-03-18 | Disposition: A | Discharge status: Home / Self Care | Payer: Medicaid - Out of State | Attending: Emergency Medicine | Admitting: Emergency Medicine

## 2016-03-18 DIAGNOSIS — J029 Acute pharyngitis, unspecified: Secondary | ICD-10-CM

## 2016-03-18 LAB — RAPID STREP SCREEN (MED CTR MEBANE ONLY): STREPTOCOCCUS, GROUP A SCREEN (DIRECT): NEGATIVE

## 2016-03-18 MED ORDER — IOPAMIDOL (ISOVUE-300) INJECTION 61%
INTRAVENOUS | Status: AC
  Administered 2016-03-18: 75 mL
  Filled 2016-03-18: qty 75

## 2016-03-18 NOTE — ED Notes (Signed)
Pt stated that he wanted to speak with provider again before he left, PA notified, new orders placed.

## 2016-03-18 NOTE — ED Provider Notes (Signed)
[facility]-EMERGENCY DEPT Provider Note   CSN: [id number] Arrival date & time: 03/18/16  0118     History   Chief Complaint Chief Complaint  Patient presents with  . Sore Throat    HPI Gerald Anderson is a [age] y.o. male.  Patient presents with sore throat for the past 2 days. No fever. He is able to eat and drink without difficulty. He describes pain deep in his throat with passing anything - solids or liquids. He has had a history of esophageal abscess, tonsillitis and mono which required hospitalization. He reports the symptoms of soreness are similar. No fever, cough, congestion. No nausea or vomiting. No chest pain.   The history is provided by the patient. No language interpreter was used.  Sore Throat  This is a recurrent problem. Pertinent negatives include no chest pain, no abdominal pain, no headaches and no shortness of breath.    Past Medical History:  Diagnosis Date  . Back pain   . Kawasaki disease [facility](HCC)     Patient Active Problem List   Diagnosis Date Noted  . Acute gangrenous tonsillitis 11/13/2014    History reviewed. No pertinent surgical history.     Home Medications    Prior to Admission medications   Medication Sig Start Date End Date Taking? Authorizing Provider  dicyclomine (BENTYL) 20 MG tablet Take 1 tablet (20 mg total) by mouth 2 (two) times daily. Patient not taking: Reported on 03/18/2016 03/03/15   Deatra CanterWilliam J Oxford, FNP  ibuprofen (ADVIL,MOTRIN) 800 MG tablet Take 1 tablet (800 mg total) by mouth 3 (three) times daily. Patient not taking: Reported on 03/18/2016 03/17/15   Dahlia ClientHannah Muthersbaugh, PA-C  polyethylene glycol powder (GLYCOLAX/MIRALAX) powder Take 17 g by mouth 2 (two) times daily. Patient not taking: Reported on 03/18/2016 03/17/15   Dahlia ClientHannah Muthersbaugh, PA-C  starch (ANUSOL) 51 % suppository Place 1 suppository rectally as needed for pain. Patient not taking: Reported on 03/18/2016 03/17/15   Dahlia ClientHannah Muthersbaugh, PA-C    Family  History No family history on file.  Social History Social History  Substance Use Topics  . Smoking status: Never Smoker  . Smokeless tobacco: Never Used  . Alcohol use Yes     Allergies   Review of patient's allergies indicates no known allergies.   Review of Systems Review of Systems  Constitutional: Negative for chills and fever.  HENT: Positive for sore throat. Negative for congestion.   Respiratory: Negative.  Negative for shortness of breath.   Cardiovascular: Negative.  Negative for chest pain.  Gastrointestinal: Negative.  Negative for abdominal pain and nausea.  Musculoskeletal: Negative.   Skin: Negative.  Negative for rash.  Neurological: Negative.  Negative for headaches.     Physical Exam Updated Vital Signs BP 115/81   Pulse (!) 57   Temp 98.2 F (36.8 C) (Oral)   Resp 18   Ht 6\' 1"  (1.854 m)   Wt 75.3 kg   SpO2 100%   BMI 21.90 kg/m   Physical Exam  Constitutional: He appears well-developed and well-nourished.  HENT:  Head: Normocephalic.  Mouth/Throat: Uvula is midline, oropharynx is clear and moist and mucous membranes are normal. No oropharyngeal exudate, posterior oropharyngeal erythema or tonsillar abscesses.  Neck: Normal range of motion. Neck supple.  Cardiovascular: Normal rate and regular rhythm.   Pulmonary/Chest: Effort normal and breath sounds normal.  Abdominal: Soft. Bowel sounds are normal. There is no tenderness. There is no rebound and no guarding.  Musculoskeletal: Normal range of motion.  Neurological:  He is alert. No cranial nerve deficit.  Skin: Skin is warm and dry. No rash noted.  Psychiatric: He has a normal mood and affect.     ED Treatments / Results  Labs (all labs ordered are listed, but only abnormal results are displayed) Labs Reviewed  RAPID STREP SCREEN (NOT AT [facility])  CULTURE, GROUP A STREP [facility])   Results for orders placed or performed during the hospital encounter of 03/18/16  Rapid strep screen    Result Value Ref Range   Streptococcus, Group A Screen (Direct) NEGATIVE NEGATIVE  Culture, group A strep  Result Value Ref Range   Specimen Description THROAT    Special Requests NONE Reflexed from [phone]    Culture NO GROUP A STREP (S.PYOGENES) ISOLATED    Report Status 03/20/2016 FINAL    Ct Soft Tissue Neck W Contrast  Result Date: 03/18/2016 CLINICAL DATA:  Sore throat for 4 days, dry cough.  No fever. EXAM: CT NECK WITH CONTRAST TECHNIQUE: Multidetector CT imaging of the neck was performed using the standard protocol following the bolus administration of intravenous contrast. CONTRAST:  75mL ISOVUE-300 IOPAMIDOL (ISOVUE-300) INJECTION 61% COMPARISON:  CT neck November 12, 2014 FINDINGS: Pharynx and larynx: Normal. Preservation of the parapharyngeal fat tissue planes. Salivary glands: Normal. Thyroid : Normal. Lymph nodes: No lymphadenopathy by CT size criteria. Vascular: Normal. Limited intracranial: Normal. Visualized orbits: Normal. Mastoids and visualized paranasal sinuses: Mild ethmoid and LEFT maxillary sinus mucosal thickening without air-fluid levels. Mastoid air cells are well aerated. Skeleton: Normal. Upper chest: Lung apices are well aerated. No superior mediastinal lymphadenopathy. Small amount of similar thymic tissue. Other: Smaller focal superficial skin thickening LEFT anterior upper neck could represent keloid, without subcutaneous gas or radiopaque foreign bodies. IMPRESSION: No acute process in the neck ; negative examination. Electronically Signed   By: Awilda Metro M.D.   On: 03/18/2016 06:28    EKG  EKG Interpretation None       Radiology No results found.  Procedures Procedures (including critical care time)  Medications Ordered in ED Medications  iopamidol (ISOVUE-300) 61 % injection (not administered)     Initial Impression / Assessment and Plan / ED Course  I have reviewed the triage vital signs and the nursing notes.  Pertinent labs & imaging  results that were available during my care of the patient were reviewed by me and considered in my medical decision making (see chart for details).  Clinical Course   Patient seen and evaluated for isolated symptoms of sore throat, felt to be viral requiring supportive care.   On discharge the patient adds to his history that he recently was hospitalized for a diagnosis of "esophageal abscess" and today's symptoms were similar. Offered CT evaluation after discussion of risk and benefit and patient opts to have study done as he is uncomfortable going home.   CT negative for abnormality. He can be discharged home as previously planned.   Final Clinical Impressions(s) / ED Diagnoses   Final diagnoses:  Pharyngitis, unspecified etiology    New Prescriptions New Prescriptions   No medications on file     Elpidio Anis, PA-C 03/24/16 0454    Tomasita Crumble, MD 03/28/16 [phone]

## 2016-03-18 NOTE — ED Triage Notes (Signed)
Pt. reports sore throat for 4 days with occasional dry cough , denies fever or chills , respirations unlabored.

## 2016-03-18 NOTE — ED Notes (Signed)
Patient transported to CT 

## 2016-03-20 LAB — CULTURE, GROUP A STREP (THRC)

## 2016-03-28 ENCOUNTER — Emergency Department (HOSPITAL_COMMUNITY)
Admission: EM | Admit: 2016-03-28 | Discharge: 2016-03-28 | Disposition: A | Discharge status: Home / Self Care | Payer: Medicaid - Out of State | Attending: Emergency Medicine | Admitting: Emergency Medicine

## 2016-03-28 ENCOUNTER — Other Ambulatory Visit: Payer: Self-pay

## 2016-03-28 ENCOUNTER — Encounter (HOSPITAL_COMMUNITY): Payer: Self-pay | Admitting: Emergency Medicine

## 2016-03-28 DIAGNOSIS — J029 Acute pharyngitis, unspecified: Secondary | ICD-10-CM | POA: Diagnosis not present

## 2016-03-28 LAB — RAPID STREP SCREEN (MED CTR MEBANE ONLY): Streptococcus, Group A Screen (Direct): NEGATIVE

## 2016-03-28 MED ORDER — PANTOPRAZOLE SODIUM 40 MG PO TBEC
40.0000 mg | DELAYED_RELEASE_TABLET | Freq: Once | ORAL | Status: AC
  Administered 2016-03-28: 40 mg via ORAL
  Filled 2016-03-28: qty 1

## 2016-03-28 MED ORDER — MAGIC MOUTHWASH W/LIDOCAINE: 5.0000 mL | Freq: Three times a day (TID) | ORAL | 0 refills | Status: DC | PRN

## 2016-03-28 MED ORDER — GI COCKTAIL ~~LOC~~
30.0000 mL | Freq: Once | ORAL | Status: AC
  Administered 2016-03-28: 30 mL via ORAL
  Filled 2016-03-28: qty 30

## 2016-03-28 MED ORDER — PANTOPRAZOLE SODIUM 20 MG PO TBEC: 20.0000 mg | DELAYED_RELEASE_TABLET | Freq: Two times a day (BID) | ORAL | 0 refills | Status: DC

## 2016-03-28 NOTE — ED Provider Notes (Signed)
[facility]PT Provider Note   CSN: [id number] Arrival date & time: 03/28/16  [id number]  By signing my name below, I, Rosario AdieWilliam Andrew Hiatt, attest that this documentation has been prepared under the direction and in the presence of Raeford RazorStephen Otilia Kareem, MD. Electronically Signed: Rosario AdieWilliam Andrew Hiatt, ED Scribe. 03/28/16. 3:27 AM.  History   Chief Complaint Chief Complaint  Patient presents with  . Sore Throat   The history is provided by the patient and medical records. No language interpreter was used.   HPI Comments: Gerald Anderson is a 25 y.o. male with a PMHx of Kawasaki disease and prior peritonsillar abscess, who presents to the Emergency Department complaining of sore throat onset approximately 12 days ago, recurrent over the past several years. He reports associated productive cough w/ blood-tinged sputum, fatigue, chills, subjective fever, sensation of fullness to the throat, and intermittent, stinging chest pain secondary to his sore throat. Pt was seen for same on 03/18/16 (approximately 10 days ago), and at that time his symptoms were attributed to likely viral etiology. CT imaging of the neck was performed at that time which was resulted unremarkable. He additionally states that he has been seen several times prior to his previous ED visit for same, each time being told that his sore throat was likely of viral origin as well. Pt has had a prior endoscopy during a previous episode of his recurrent sore throat which revealed a esophogeal abscess. At that time he was rx'd abx intervention which he notes improved his symptoms, however, his symptoms promptly returned after completing the course of abx. Pt is able to tolerate their own secretions, but his pain is exacerbated w/ swallowing. He additionally states that his chest pain is exacerbated with laying supine, and alleviated with sitting forwards. Pt has had normal food and fluid intake since the onset of his symptoms. He is non-smoker. Denies  weight loss, metallic taste to his mouth, night sweats, or any other associated symptoms.   Past Medical History:  Diagnosis Date  . Back pain   . Kawasaki disease [facility](HCC)    Patient Active Problem List   Diagnosis Date Noted  . Acute gangrenous tonsillitis 11/13/2014   History reviewed. No pertinent surgical history.  Home Medications    Prior to Admission medications   Medication Sig Start Date End Date Taking? Authorizing Provider  dicyclomine (BENTYL) 20 MG tablet Take 1 tablet (20 mg total) by mouth 2 (two) times daily. Patient not taking: Reported on 03/18/2016 03/03/15   Deatra CanterWilliam J Oxford, FNP  ibuprofen (ADVIL,MOTRIN) 800 MG tablet Take 1 tablet (800 mg total) by mouth 3 (three) times daily. Patient not taking: Reported on 03/18/2016 03/17/15   Dahlia ClientHannah Muthersbaugh, PA-C  polyethylene glycol powder (GLYCOLAX/MIRALAX) powder Take 17 g by mouth 2 (two) times daily. Patient not taking: Reported on 03/18/2016 03/17/15   Dahlia ClientHannah Muthersbaugh, PA-C  starch (ANUSOL) 51 % suppository Place 1 suppository rectally as needed for pain. Patient not taking: Reported on 03/18/2016 03/17/15   Dahlia ClientHannah Muthersbaugh, PA-C   \ Family History No family history on file.  Social History Social History  Substance Use Topics  . Smoking status: Never Smoker  . Smokeless tobacco: Never Used  . Alcohol use Yes     Comment: socially   Allergies   Review of patient's allergies indicates no known allergies.  Review of Systems Review of Systems  Constitutional: Positive for chills, fatigue and fever (subjective). Negative for diaphoresis and unexpected weight change.  HENT: Positive for sore throat.  Respiratory: Positive for cough.   Cardiovascular: Positive for chest pain.  All other systems reviewed and are negative.  Physical Exam Updated Vital Signs BP 130/100 (BP Location: Left Arm)   Pulse 88   Temp 97.3 F (36.3 C) (Oral)   Resp 17   Ht 6\' 1"  (1.854 m)   Wt 153 lb (69.4 kg)   SpO2  100%   BMI 20.19 kg/m   Physical Exam  Constitutional: He appears well-developed and well-nourished.  HENT:  Head: Normocephalic.  Right Ear: External ear normal.  Left Ear: External ear normal.  Nose: Nose normal.  Mouth/Throat: Uvula is midline.  Posterior oropharynx is very inflamed. Minimal bleeding on exam. Phonation intact. Pt is handling secretions well.   Eyes: Conjunctivae are normal. Right eye exhibits no discharge. Left eye exhibits no discharge.  Neck: Normal range of motion. Neck supple.  Cardiovascular: Normal rate, regular rhythm and normal heart sounds.   No murmur heard. Pulmonary/Chest: Effort normal and breath sounds normal. No respiratory distress. He has no wheezes. He has no rales.  Abdominal: Soft. There is no tenderness. There is no rebound and no guarding.  Musculoskeletal: Normal range of motion. He exhibits no edema or tenderness.  Lymphadenopathy:    He has no cervical adenopathy.  Neurological: He is alert. No cranial nerve deficit. Coordination normal.  Skin: Skin is warm and dry. No erythema. No pallor.  Psychiatric: He has a normal mood and affect. His behavior is normal.  Nursing note and vitals reviewed.  ED Treatments / Results  DIAGNOSTIC STUDIES: Oxygen Saturation is 100% on RA, normal by my interpretation.   COORDINATION OF CARE: 3:27 AM-Discussed next steps with pt. Pt verbalized understanding and is agreeable with the plan.   Labs (all labs ordered are listed, but only abnormal results are displayed) Labs Reviewed  RAPID STREP SCREEN (NOT AT [facility])  CULTURE, GROUP A STREP [facility])   EKG  EKG Interpretation None      Radiology No results found.  Procedures Procedures   Medications Ordered in ED Medications - No data to display  Initial Impression / Assessment and Plan / ED Course  I have reviewed the triage vital signs and the nursing notes.  Pertinent labs & imaging results that were available during my care of the  patient were reviewed by me and considered in my medical decision making (see chart for details).  Clinical Course    Final Clinical Impressions(s) / ED Diagnoses   Final diagnoses:  Pharyngitis, unspecified etiology   New Prescriptions New Prescriptions   No medications on file   I personally preformed the services scribed in my presence. The recorded information has been reviewed is accurate. Raeford RazorStephen Fenix Rorke, MD.     Raeford RazorStephen Keziah Drotar, MD 03/31/16 1318

## 2016-03-28 NOTE — ED Triage Notes (Signed)
Pt complains of a sore throat for a week and a half now. [facility] and diagnosed with a viral infection. Pt states that today his throat hurts and he has some blood tinged mucus.

## 2016-03-30 LAB — CULTURE, GROUP A STREP (THRC)

## 2016-07-29 ENCOUNTER — Emergency Department (HOSPITAL_COMMUNITY): Payer: BLUE CROSS/BLUE SHIELD

## 2016-07-29 ENCOUNTER — Emergency Department (HOSPITAL_COMMUNITY)
Admission: EM | Admit: 2016-07-29 | Discharge: 2016-07-29 | Disposition: A | Discharge status: Home / Self Care | Payer: BLUE CROSS/BLUE SHIELD | Attending: Emergency Medicine | Admitting: Emergency Medicine

## 2016-07-29 ENCOUNTER — Encounter (HOSPITAL_COMMUNITY): Payer: Self-pay | Admitting: Emergency Medicine

## 2016-07-29 DIAGNOSIS — R042 Hemoptysis: Secondary | ICD-10-CM

## 2016-07-29 DIAGNOSIS — J312 Chronic pharyngitis: Secondary | ICD-10-CM | POA: Insufficient documentation

## 2016-07-29 DIAGNOSIS — R109 Unspecified abdominal pain: Secondary | ICD-10-CM | POA: Insufficient documentation

## 2016-07-29 DIAGNOSIS — G8929 Other chronic pain: Secondary | ICD-10-CM | POA: Diagnosis not present

## 2016-07-29 LAB — COMPREHENSIVE METABOLIC PANEL
ALT: 14 U/L — ABNORMAL LOW (ref 17–63)
AST: 24 U/L (ref 15–41)
Albumin: 3.8 g/dL (ref 3.5–5.0)
Alkaline Phosphatase: 33 U/L — ABNORMAL LOW (ref 38–126)
Anion gap: 5 (ref 5–15)
BUN: 13 mg/dL (ref 6–20)
CALCIUM: 8.8 mg/dL — AB (ref 8.9–10.3)
CHLORIDE: 106 mmol/L (ref 101–111)
CO2: 29 mmol/L (ref 22–32)
CREATININE: 0.92 mg/dL (ref 0.61–1.24)
Glucose, Bld: 88 mg/dL (ref 65–99)
Potassium: 3.8 mmol/L (ref 3.5–5.1)
Sodium: 140 mmol/L (ref 135–145)
TOTAL PROTEIN: 6.2 g/dL — AB (ref 6.5–8.1)
Total Bilirubin: 0.7 mg/dL (ref 0.3–1.2)

## 2016-07-29 LAB — CBC WITH DIFFERENTIAL/PLATELET
BASOS ABS: 0 10*3/uL (ref 0.0–0.1)
BASOS PCT: 0 %
EOS ABS: 0.1 10*3/uL (ref 0.0–0.7)
EOS PCT: 2 %
HCT: 40.5 % (ref 39.0–52.0)
Hemoglobin: 13.7 g/dL (ref 13.0–17.0)
Lymphocytes Relative: 23 %
Lymphs Abs: 1.1 10*3/uL (ref 0.7–4.0)
MCH: 28.9 pg (ref 26.0–34.0)
MCHC: 33.8 g/dL (ref 30.0–36.0)
MCV: 85.4 fL (ref 78.0–100.0)
MONO ABS: 0.7 10*3/uL (ref 0.1–1.0)
Monocytes Relative: 13 %
Neutro Abs: 3 10*3/uL (ref 1.7–7.7)
Neutrophils Relative %: 62 %
PLATELETS: 151 10*3/uL (ref 150–400)
RBC: 4.74 MIL/uL (ref 4.22–5.81)
RDW: 12.3 % (ref 11.5–15.5)
WBC: 4.9 10*3/uL (ref 4.0–10.5)

## 2016-07-29 LAB — LIPASE, BLOOD: LIPASE: 22 U/L (ref 11–51)

## 2016-07-29 LAB — D-DIMER, QUANTITATIVE (NOT AT ARMC)

## 2016-07-29 MED ORDER — PANTOPRAZOLE SODIUM 40 MG PO TBEC: 40.0000 mg | DELAYED_RELEASE_TABLET | Freq: Every day | ORAL | 0 refills | Status: DC

## 2016-07-29 NOTE — ED Triage Notes (Addendum)
Pt reports coughing up blood x 8 months. sts was scheduled for endoscopy  Yet unable to do due to insurances issues. No obvious resp distress. Reports throat pain . Reports intermittent LUQ pain .  sts Hx tonsillitis 2 years ago. Denies chest pain . sts had recent chest xray . No active resp  Illness. Pt added new onset of night sweats.

## 2016-07-29 NOTE — ED Provider Notes (Signed)
[facility]-EMERGENCY DEPT Provider Note   CSN: [id number] Arrival date & time: 07/29/16  [id number]     History   Chief Complaint Chief Complaint  Patient presents with  . coughing blood    HPI Gerald Anderson is a 26 y.o. male.  HPI  26 year old male presents with hemoptysis. He states that he has had this on and off for about 8 months. Most recent episode was 2 weeks ago. He woke up this morning with his typical sore throat and when he was clearing his throat and coughing he had blood in the sputum. He states he typically has a cough but not always having the hemoptysis. He has chronic sore throat for at least 8 months. He states he's had scopes before from an ENT that one time showed an abscess. He was placed on Magic mouthwash, also a spray, and also has had antibiotics. However he states his symptoms have not changed. He was recommended follow-up with a GI specialist for an EGD but never followed up due to the cost. He also endorses chronic upper abdominal pain for at least 8 months if not longer. This is unchanged from prior. Occasionally worsens with eating. There is no current chest pain or shortness of breath. He has chronic night sweats but denies weight loss. 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 No leg swelling, calf pain, or recent travel or surgery.  Past Medical History:  Diagnosis Date  . Back pain   . Kawasaki disease [facility](HCC)     Patient Active Problem List   Diagnosis Date Noted  . Acute gangrenous tonsillitis 11/13/2014    History reviewed. No pertinent surgical history.     Home Medications    Prior to Admission medications   Medication Sig Start Date End Date Taking? Authorizing Provider  pantoprazole (PROTONIX) 40 MG tablet Take 1 tablet (40 mg total) by mouth daily. 07/29/16   Pricilla LovelessScott Jelene Albano, MD    Family History No family history on file.  Social History Social History  Substance Use Topics  . Smoking  status: Never Smoker  . Smokeless tobacco: Never Used  . Alcohol use Yes     Comment: socially     Allergies   Patient has no known allergies.   Review of Systems Review of Systems  Constitutional: Positive for diaphoresis (night sweats). Negative for unexpected weight change.  HENT: Positive for sore throat and trouble swallowing (painful).   Respiratory: Positive for cough. Negative for shortness of breath.   Gastrointestinal: Positive for abdominal pain.  All other systems reviewed and are negative.    Physical Exam Updated Vital Signs BP 129/75 (BP Location: Left Arm)   Pulse 67   Temp 98 F (36.7 C) (Oral)   Resp 18   SpO2 100%   Physical Exam  Constitutional: He is oriented to person, place, and time. He appears well-developed and well-nourished.  HENT:  Head: Normocephalic and atraumatic.  Right Ear: External ear normal.  Left Ear: External ear normal.  Nose: Nose normal.  Mouth/Throat: Oropharynx is clear and moist. No oropharyngeal exudate.  Oropharynx appears normal on exam, no tonsillar hypertrophy, sites of bleeding or abscess  Eyes: Right eye exhibits no discharge. Left eye exhibits no discharge.  Neck: Neck supple.    Cardiovascular: Normal rate, regular rhythm and normal heart sounds.   Pulmonary/Chest: Effort normal and breath sounds normal. No stridor. He has no wheezes.  Abdominal: Soft. He exhibits no  distension. There is no tenderness.  Musculoskeletal: He exhibits no edema.  Lymphadenopathy:    He has no cervical adenopathy.  Neurological: He is alert and oriented to person, place, and time.  Skin: Skin is warm and dry.  Nursing note and vitals reviewed.    ED Treatments / Results  Labs (all labs ordered are listed, but only abnormal results are displayed) Labs Reviewed  COMPREHENSIVE METABOLIC PANEL - Abnormal; Notable for the following:       Result Value   Calcium 8.8 (*)    Total Protein 6.2 (*)    ALT 14 (*)    Alkaline  Phosphatase 33 (*)    All other components within normal limits  LIPASE, BLOOD  CBC WITH DIFFERENTIAL/PLATELET  D-DIMER, QUANTITATIVE (NOT AT [facility])    EKG  EKG Interpretation None       Radiology Dg Chest 2 View  Result Date: 07/29/2016 CLINICAL DATA:  Severe sore throat, chest pain, shortness of breath. Hemoptysis. EXAM: CHEST  2 VIEW COMPARISON:  CT and plain film 07/19/2014 FINDINGS: The heart size and mediastinal contours are within normal limits. Both lungs are clear. The visualized skeletal structures are unremarkable. IMPRESSION: Normal study Electronically Signed   By: Charlett Nose M.D.   On: 07/29/2016 09:55    Procedures Procedures (including critical care time)  Medications Ordered in ED Medications - No data to display   Initial Impression / Assessment and Plan / ED Course  I have reviewed the triage vital signs and the nursing notes.  Pertinent labs & imaging results that were available during my care of the patient were reviewed by me and considered in my medical decision making (see chart for details).  Clinical Course as of Jul 30 1655  Tue Jul 29, 2016  0932 Patient is well appearing. I have low suspicion for PE given recurrence but with hemoptysis will get ddimer in otherwise low risk patient. Will get CXR, labs.   [SG]    Clinical Course User Index [SG] Pricilla Loveless, MD    Patient symptoms are chronic in nature. I have a low suspicion that he has a PE despite his mild hemoptysis. This is been a chronic issue. There is no sign of weight loss and I think tuberculosis is less likely although obvious and not fully ruled out with negative x-ray. He has no distress. No respiratory symptoms such as shortness of breath or change in voice. Will try PPI given his chronic abdominal pain as the blood may be from regurgitation. Encouraged follow-up with PCP at the [facility] as well as following up with his consult as outpatient which she has seen  in the past. No obvious emergent conditions at this time and he appears stable for discharge.  Final Clinical Impressions(s) / ED Diagnoses   Final diagnoses:  Hemoptysis  Chronic sore throat  Chronic abdominal pain    New Prescriptions Discharge Medication List as of 07/29/2016 11:55 AM       Pricilla Loveless, MD 07/29/16 1658

## 2016-07-31 ENCOUNTER — Ambulatory Visit (INDEPENDENT_AMBULATORY_CARE_PROVIDER_SITE_OTHER): Payer: BLUE CROSS/BLUE SHIELD | Admitting: Physician Assistant

## 2016-07-31 ENCOUNTER — Encounter (INDEPENDENT_AMBULATORY_CARE_PROVIDER_SITE_OTHER): Payer: Self-pay | Admitting: Physician Assistant

## 2016-07-31 VITALS — BP 134/78 | HR 69 | Temp 98.0°F | Ht 75.5 in | Wt 164.0 lb

## 2016-07-31 DIAGNOSIS — R070 Pain in throat: Secondary | ICD-10-CM | POA: Diagnosis not present

## 2016-07-31 DIAGNOSIS — R61 Generalized hyperhidrosis: Secondary | ICD-10-CM | POA: Diagnosis not present

## 2016-07-31 DIAGNOSIS — Z23 Encounter for immunization: Secondary | ICD-10-CM

## 2016-07-31 DIAGNOSIS — F418 Other specified anxiety disorders: Secondary | ICD-10-CM

## 2016-07-31 DIAGNOSIS — R042 Hemoptysis: Secondary | ICD-10-CM | POA: Diagnosis not present

## 2016-07-31 DIAGNOSIS — F419 Anxiety disorder, unspecified: Secondary | ICD-10-CM

## 2016-07-31 DIAGNOSIS — R5383 Other fatigue: Secondary | ICD-10-CM

## 2016-07-31 DIAGNOSIS — F329 Major depressive disorder, single episode, unspecified: Secondary | ICD-10-CM

## 2016-07-31 DIAGNOSIS — F32A Depression, unspecified: Secondary | ICD-10-CM

## 2016-07-31 MED ORDER — SERTRALINE HCL 25 MG PO TABS: 25.0000 mg | ORAL_TABLET | Freq: Every day | ORAL | 1 refills | Status: DC

## 2016-07-31 NOTE — Progress Notes (Signed)
Subjective:  Patient ID: Gerald Anderson, male    DOB: 07-20-1990  Age: 26 y.o. MRN: [id number]  CC:  Follow up ED hemoptysis   HPI Gerald Anderson is a 26 y.o. male with a PMH of Kawasaki disease and anxiety/depression presents for follow up of hemoptysis. ED visit on 07/29/16 for hemoptysis, chronic sore throat, and chronic abdominal pain. Labs abnormal for Ca 8.8, ALT 14, and ALP 33. Lipase, CBC, and D-Dimer normal. CXR normal. Patient discharged on pantoprazole. Says he feels as if though something is stuck in the back of his throat. He is worried that it may have something to do with an abscess that was found in [place] throat [place]. He has been to the ED and urgent cares with no answer for his throat discomfort/sensation. Was advised to go to ENT previously but did not go due to financial reasons. No odynophagia or dysphagia. Patient has suffered from anxiety and depression and has been off of medications for many years. He is not sure if he is anxious or depressed but does report much stress at work, fatigue from working up to 14 hours per day, and frustration from no being able to receive treatment for herniated discs due to financial reasons. Patient denies any other symptoms.   ROS Review of Systems  Constitutional: Positive for diaphoresis and malaise/fatigue. Negative for chills and fever.  Eyes: Negative for blurred vision.  Respiratory: Negative for shortness of breath.   Cardiovascular: Negative for chest pain and palpitations.  Gastrointestinal: Negative for abdominal pain, nausea and vomiting.  Genitourinary: Negative for dysuria and hematuria.  Musculoskeletal: Negative for joint pain and myalgias.  Skin: Negative for rash.  Neurological: Negative for tingling and headaches.  Psychiatric/Behavioral: Positive for depression. The patient is nervous/anxious.     Objective:  BP 134/78 (BP Location: Right Arm, Patient Position: Sitting, Cuff Size: Normal)   Pulse 69    Temp 98 F (36.7 C) (Oral)   Ht 6' 3.5" (1.918 m)   Wt 164 lb (74.4 kg)   SpO2 96%   BMI 20.23 kg/m   BP/Weight 07/31/2016 07/29/2016 03/28/2016  Systolic BP 134 129 122  Diastolic BP 78 75 68  Wt. (Lbs) 164 - 153  BMI 20.23 - 20.19      Physical Exam  Constitutional: He is oriented to person, place, and time.  Well developed, well nourished, NAD, polite, somewhat fatigued  HENT:  Head: Normocephalic and atraumatic.  Tonsils 2+ with two white lesions suspected to be tonsilloliths on left tonsil. Tonsils without erythema or edema. Cobblestoning changes of the pharyngeal wall. Uvula midline. Turbinates erythematous and hypertrophic bilaterally with clear rhinorrhea.   Eyes: No scleral icterus.  Neck: Normal range of motion. Neck supple. No thyromegaly present.  Cardiovascular: Normal rate, regular rhythm and normal heart sounds.   Pulmonary/Chest: Effort normal and breath sounds normal.  Abdominal: Soft. Bowel sounds are normal. There is no tenderness.  Musculoskeletal: He exhibits no edema.  Neurological: He is alert and oriented to person, place, and time.  Skin: Skin is warm and dry. No rash noted. No erythema. No pallor.  Psychiatric: He has a normal mood and affect. His behavior is normal. Thought content normal.  Vitals reviewed.    Assessment & Plan:   1. Throat discomfort - Suspected Tonsillolith vs Globus. Will need further evaluation with ENT. - Ambulatory referral to ENT  2. Hemoptysis - Unknown etiology. CXR normal two days ago. D-Dimer normal. - PT AND PTT -  ANA w/Reflex - PPD - H. pylori antibody, IgG  3. Anxiety and depression - Start Sertraline 25mg  qday - TSH  4. Fatigue, unspecified type - TSH  5. Diaphoresis - Suspected anxiety reaction - TSH - ANA w/Reflex  6. Need for Tdap vaccination - Tdap vaccine greater than or equal to 7yo IM   Meds ordered this encounter  Medications  . sertraline (ZOLOFT) 25 MG tablet    Sig: Take 1 tablet  (25 mg total) by mouth daily.    Dispense:  30 tablet    Refill:  1    Order Specific Question:   Supervising Provider    Answer:   Gerald Anderson [id number]    Follow-up: Return in about 4 weeks (around 08/28/2016) for f/u anxiety depression, throat discomfort, hemoptysis.   Gerald Specter PA

## 2016-07-31 NOTE — Patient Instructions (Addendum)
I am referring you to an ENT specialist. They will evaluate you and possibly conduct an upper endoscopy.   Hemoptysis Hemoptysis is coughing up blood. It can be mild or serious. With mild hemoptysis, you may cough up blood-streaked saliva and mucus (sputum) when you have an infection in your nose, throat, or lungs (respiratory tract). Coughing up 1-2 cups (240-480 mL) of blood within 24 hours (massive hemoptysis) is a medical emergency. The most common cause of hemoptysis is a respiratory tract infection, such as bronchitis or pneumonia. Other common causes include:  A lung tumor or upper airway tumor.  A medical condition that damages the large air passageways (bronchiectasis).  A blood clot in the lungs (pulmonary embolism).  A medical condition that keeps your blood from clotting normally.  Breathing in (inhaling) a small foreign object. Sometimes the cause is not known. Hemoptysis can be a sign of a minor or serious medical condition, so it is important to see your health care provider. Follow these instructions at home:  Watch your condition for any changes.  Take over-the-counter and prescription medicines only as told by your health care provider.  If you were prescribed an antibiotic medicine, take it as told by your health care provider. Do not stop taking the antibiotic even if you start to feel better.  Return to your normal activities as told by your health care provider. Ask your health care provider what activities are safe for you.  Do not use any products that contain nicotine or tobacco, such as cigarettes and e-cigarettes. If you need help quitting, ask your health care provider.  Keep all follow-up visits as told by your health care provider. This is important. Contact a health care provider if:  You have a fever.  You cough up blood-streaked (blood-tinged) sputum. Get help right away if:  You cough up fresh blood or blood clots.  You have trouble  breathing.  You have chest pain. This information is not intended to replace advice given to you by your health care provider. Make sure you discuss any questions you have with your health care provider. Document Released: 07/21/2001 Document Revised: 02/08/2016 Document Reviewed: 02/08/2016 Elsevier Interactive Patient Education  2017 [employer]

## 2016-08-01 ENCOUNTER — Emergency Department (HOSPITAL_COMMUNITY): Payer: BLUE CROSS/BLUE SHIELD

## 2016-08-01 ENCOUNTER — Ambulatory Visit (HOSPITAL_COMMUNITY)
Admission: EM | Admit: 2016-08-01 | Discharge: 2016-08-01 | Disposition: A | Discharge status: Home / Self Care | Payer: BLUE CROSS/BLUE SHIELD | Attending: Emergency Medicine | Admitting: Emergency Medicine

## 2016-08-01 ENCOUNTER — Encounter (HOSPITAL_COMMUNITY): Payer: Self-pay | Admitting: Emergency Medicine

## 2016-08-01 ENCOUNTER — Emergency Department (HOSPITAL_COMMUNITY)
Admission: EM | Admit: 2016-08-01 | Discharge: 2016-08-01 | Disposition: A | Discharge status: Home / Self Care | Payer: BLUE CROSS/BLUE SHIELD | Attending: Emergency Medicine | Admitting: Emergency Medicine

## 2016-08-01 DIAGNOSIS — R112 Nausea with vomiting, unspecified: Secondary | ICD-10-CM | POA: Diagnosis not present

## 2016-08-01 DIAGNOSIS — R110 Nausea: Secondary | ICD-10-CM

## 2016-08-01 DIAGNOSIS — J029 Acute pharyngitis, unspecified: Secondary | ICD-10-CM | POA: Diagnosis not present

## 2016-08-01 DIAGNOSIS — R52 Pain, unspecified: Secondary | ICD-10-CM | POA: Insufficient documentation

## 2016-08-01 DIAGNOSIS — J3501 Chronic tonsillitis: Secondary | ICD-10-CM | POA: Diagnosis not present

## 2016-08-01 DIAGNOSIS — R509 Fever, unspecified: Secondary | ICD-10-CM

## 2016-08-01 LAB — TSH: TSH: 0.821 u[IU]/mL (ref 0.450–4.500)

## 2016-08-01 LAB — POCT RAPID STREP A: STREPTOCOCCUS, GROUP A SCREEN (DIRECT): NEGATIVE

## 2016-08-01 LAB — PT AND PTT
APTT: 27 s (ref 24–33)
INR: 1.1 (ref 0.8–1.2)
PROTHROMBIN TIME: 11.6 s (ref 9.1–12.0)

## 2016-08-01 LAB — H. PYLORI ANTIBODY, IGG: H. pylori, IgG AbS: <0.8 Index Value (ref 0.00–0.79)

## 2016-08-01 LAB — ANA W/REFLEX: ANA: NEGATIVE

## 2016-08-01 MED ORDER — ONDANSETRON 4 MG PO TBDP
ORAL_TABLET | ORAL | Status: AC
  Filled 2016-08-01: qty 1

## 2016-08-01 MED ORDER — ONDANSETRON 4 MG PO TBDP
4.0000 mg | ORAL_TABLET | Freq: Once | ORAL | Status: AC
  Administered 2016-08-01: 4 mg via ORAL
  Filled 2016-08-01: qty 1

## 2016-08-01 MED ORDER — ACETAMINOPHEN 325 MG PO TABS
ORAL_TABLET | ORAL | Status: AC
Start: 2016-08-01 — End: 2016-08-01
  Filled 2016-08-01: qty 2

## 2016-08-01 MED ORDER — IBUPROFEN 800 MG PO TABS
800.0000 mg | ORAL_TABLET | Freq: Once | ORAL | Status: AC
  Administered 2016-08-01: 800 mg via ORAL

## 2016-08-01 MED ORDER — ONDANSETRON 4 MG PO TBDP: 4.0000 mg | ORAL_TABLET | Freq: Three times a day (TID) | ORAL | 0 refills | Status: DC | PRN

## 2016-08-01 MED ORDER — ACETAMINOPHEN 325 MG PO TABS
650.0000 mg | ORAL_TABLET | Freq: Once | ORAL | Status: AC
  Administered 2016-08-01: 650 mg via ORAL

## 2016-08-01 MED ORDER — ONDANSETRON 4 MG PO TBDP
8.0000 mg | ORAL_TABLET | Freq: Once | ORAL | Status: AC
  Administered 2016-08-01: 8 mg via ORAL

## 2016-08-01 MED ORDER — IBUPROFEN 800 MG PO TABS
ORAL_TABLET | ORAL | Status: AC
Start: 2016-08-01 — End: 2016-08-01
  Filled 2016-08-01: qty 1

## 2016-08-01 NOTE — ED Provider Notes (Signed)
HPI  SUBJECTIVE:  Gerald Anderson is a 26 y.o. male who presents with nausea, 2 episodes of nonbilious nonbloody emesis, "no energy," body aches, headaches starting today. He reports 2-3 episodes of hemoptysis mixed with mucus per day for the past 2 days. He also reports some mild dysuria starting this morning. He reports a sore throat for the past 8 months and states that it is not changed. Reports abdominal pain but states that is not new or different. He also reports left-sided chest pain but states it is been present for months and it is not changed today. Decreased appetite, but also present for some time. No aggravating or alleviating factors. He has not tried anything for this. He denies fevers at home, nasal congestion, rhinorrhea. No unintentional weight loss. No wheezing no urinary urgency, frequency, cloudy or odorous urine, hematuria. No melena, hematochezia, penile discharge. No rash. Patient seen in the ED on 3/6 for hemoptysis, sore throat, abdominal pain. He was not found to have anything acute and had a negative chest x-ray and d-dimer, and sent home with PPI. Seen the next day by PMD for follow-up, thought to have a tonsillitis versus globus. He was referred to ENT. Hemoptysis was of unknown etiology and plan was to do coags, a PPD and H. pylori antibody, TSH. He was started on sertraline yesterday as well. Told to f/u on 4/5.    Past medical history of Kawasaki disease, GERD, depression. Had a peritonsillar abscess, 11/12/2014.  Coags, TSH, normal, H pylori neg. ANA pending  Past Medical History:  Diagnosis Date  . Back pain   . Kawasaki disease [facility])     History reviewed. No pertinent surgical history.  History reviewed. No pertinent family history.  Social History  Substance Use Topics  . Smoking status: Never Smoker  . Smokeless tobacco: Never Used  . Alcohol use Yes     Comment: socially    No current facility-administered medications for this encounter.   Current  Outpatient Prescriptions:  .  pantoprazole (PROTONIX) 40 MG tablet, Take 1 tablet (40 mg total) by mouth daily., Disp: 30 tablet, Rfl: 0 .  sertraline (ZOLOFT) 25 MG tablet, Take 1 tablet (25 mg total) by mouth daily., Disp: 30 tablet, Rfl: 1 date  No Known Allergies   ROS  As noted in HPI.   Physical Exam  Temp 100.2 F (37.9 C) (Oral)   Resp 18   SpO2 100%   Orthostatic VS for the past 24 hrs:  BP- Lying Pulse- Lying BP- Sitting Pulse- Sitting BP- Standing at 0 minutes Pulse- Standing at 0 minutes  08/01/16 1145 121/71 106 135/71 108 151/85 109   Constitutional: Well developed, well nourished, no acute distress Eyes: PERRL, EOMI, conjunctiva normal bilaterally HENT: Normocephalic, atraumatic,mucus membranes moistTMs normal. Mild nasal congestion. Positive frontal sinus tenderness. No maxillary sinus tenderness. Tonsils 2+ bilaterally. Mild exudate on the left side. Uvula midline. Positive cobblestoning with postnasal drip.  Lymph: No appreciable cervical lymphadenopathy. No  Resp meningismus iratory: Clear to auscultation bilaterally, no rales, no wheezing, no rhonchi Cardiovascular:Mild regular tachycardia, no murmurs, no gallops, no rubs GI: Normal appearance, mild left upper quadrant tenderness which the patient states is not new, Soft, nondistended, normal bowel sounds, no rebound, no guarding negative Murphy, negative McBurney  Back: no CVAT skin: No rash, skin intact Musculoskeletal: No edema, no tenderness, no deformities Neurologic: Alert & oriented x 3, CN II-XII grossly intact, no motor deficits, sensation grossly intact Psychiatric: Speech and behavior appropriate   ED  Course   Medications  acetaminophen (TYLENOL) tablet 650 mg (650 mg Oral Given 08/01/16 1154)  ondansetron (ZOFRAN-ODT) disintegrating tablet 8 mg (8 mg Oral Given 08/01/16 1306)  ibuprofen (ADVIL,MOTRIN) tablet 800 mg (800 mg Oral Given 08/01/16 1306)    Orders Placed This Encounter  Procedures  .  POCT rapid strep A [facility])    Standing Status:   Standing    Number of Occurrences:   1   Results for orders placed or performed during the hospital encounter of 08/01/16 (from the past 24 hour(s))  POCT rapid strep A [facility])     Status: None   Collection Time: 08/01/16 12:11 PM  Result Value Ref Range   Streptococcus, Group A Screen (Direct) NEGATIVE NEGATIVE   No results found.  ED Clinical Impression  Fever, unspecified fever cause  Sore throat  ED Assessment/Plan  Previous records reviewed. As noted in history of present illness.  Patient is not orthostatic. His rapid strep is negative. Offered to do UA to rule out UTI.  he patient states that it does not feel like previous UTIs.  His presentation seems to be consistent with an influenza or viral syndrome that he has picked up over the past few days. The sore throat has been chronic. he does have a fever of 101.3 after giving Tylenol so we gave him some ibuprofen and some Zofran for nausea and vomiting. Patient seems to have significant anxiety over the sore throat and is concerned that he is going to deteriorate. States that he was hospitalized 2 years ago for mono and dehydration. This could be return of his mono. Does not appear to be meningitis. There is no evidence of peritonsillar abscess on my exam, however, this is an differential. Also the differential would be epiglottitis or retropharyngeal abscess but think that this is much less likely. Offered to send patient home with supportive treatment, Tamiflu, and see how he does, but the patient is concerned enough that he states that he wants to go to the ED for evaluation. Patient is stable to go by shuttle. Transferring to the ED.    Meds ordered this encounter  Medications  . acetaminophen (TYLENOL) tablet 650 mg  . ondansetron (ZOFRAN-ODT) disintegrating tablet 8 mg  . ibuprofen (ADVIL,MOTRIN) tablet 800 mg    *This clinic note was created using Administrator, [employer]Dragon  dictation software. Therefore, there may be occasional mistakes despite careful proofreading.  ?   Domenick GongAshley Dreon Pineda, MD 08/01/16 1324

## 2016-08-01 NOTE — Discharge Instructions (Signed)
I am sorry you have been experiencing throat discomfort for so long.  Please follow up with ENT for further evaluation of your sore throat.    Your nausea, vomiting, fever, abdominal pain, loose stools, body aches are likely from a viral illness.  Please drink plenty of water.  Take zofran for nausea as needed. Return to the emergency department if your symptoms last longer than 2-3 days or if they worsen.

## 2016-08-01 NOTE — ED Triage Notes (Addendum)
Pt to ER for fever, chills, coughing up blood, and body aches x3 days. Reports seeing PCP yesterday, had labs drawn. Pt is a/o x4. [facility].

## 2016-08-01 NOTE — ED Provider Notes (Addendum)
[facility]Y DEPT Provider Note   CSN: [id number] Arrival date & time: 08/01/16  1322     History   Chief Complaint Chief Complaint  Patient presents with  . Fever  . Generalized Body Aches    HPI Gerald Anderson is a 26 y.o. male with pertinent past medical history of chronic sore throat, abdominal pain, cough nausea, vomiting, abdominal pain since to emergency department with fever, nausea, vomiting, abdominal pain, headache, chills, body aches and loose stools since this morning. Patient denies sick contacts with similar symptoms. Patient denies chest pain, cough, shortness of breath, urinary symptoms, rashes, changes in voice, neck pain, difficulty opening/closing jaw. Patient also reports sore throat with foreign body sensation and occasional hemoptysis, these symptoms have been going on for almost one year. He states he has been evaluated for sore throat, foreign body sensation, tonsillitis by multiple providers and has been referred to ENT and GI for further workup. Patient states he has not been able to follow up with specialist due to insurance/cough. Patient expresses frustration given long history of inflamed tonsils and sore throat. Next  Per chart review patient has been worked up for pharyngitis/sore throat /hemoptysis in the ED and by his primary care provider. So far this month patient has had normal group a strep, CMP, lipase, d-dimer, H. pylori antibody, PT and PTT, CXR.  CT head/neck in 03/2016 was normal.  sHPI  Past Medical History:  Diagnosis Date  . Back pain   . Kawasaki disease [facility](HCC)     Patient Active Problem List   Diagnosis Date Noted  . Acute gangrenous tonsillitis 11/13/2014    History reviewed. No pertinent surgical history.     Home Medications    Prior to Admission medications   Medication Sig Start Date End Date Taking? Authorizing Provider  ondansetron (ZOFRAN ODT) 4 MG disintegrating tablet Take 1 tablet (4 mg total) by mouth every 8  (eight) hours as needed for nausea or vomiting. 08/01/16   Liberty Handylaudia J Gibbons, PA-C  pantoprazole (PROTONIX) 40 MG tablet Take 1 tablet (40 mg total) by mouth daily. 07/29/16   Pricilla LovelessScott Goldston, MD  sertraline (ZOLOFT) 25 MG tablet Take 1 tablet (25 mg total) by mouth daily. 07/31/16   Loletta Specteroger David Gomez, PA-C    Family History History reviewed. No pertinent family history.  Social History Social History  Substance Use Topics  . Smoking status: Never Smoker  . Smokeless tobacco: Never Used  . Alcohol use Yes     Comment: socially     Allergies   Patient has no known allergies.   Review of Systems Review of Systems  Constitutional: Positive for fever.  HENT: Positive for sore throat. Negative for congestion, facial swelling, postnasal drip, trouble swallowing and voice change.   Eyes: Negative for visual disturbance.  Respiratory: Negative for cough, choking and shortness of breath.   Cardiovascular: Negative for chest pain.  Gastrointestinal: Positive for abdominal pain, nausea and vomiting. Negative for constipation and diarrhea.  Genitourinary: Negative for difficulty urinating.  Musculoskeletal: Negative for arthralgias.  Skin: Negative for rash.  Neurological: Positive for headaches. Negative for dizziness, weakness and numbness.     Physical Exam Updated Vital Signs BP (!) 115/49 (BP Location: Right Arm)   Pulse 97   Temp 98.7 F (37.1 C) (Oral)   Resp 16   SpO2 100%   Physical Exam  Constitutional: He is oriented to person, place, and time. He appears well-developed and well-nourished. No distress.  HENT:  Head: Normocephalic and  atraumatic.  Nose: Nose normal. No mucosal edema or rhinorrhea. Right sinus exhibits no maxillary sinus tenderness and no frontal sinus tenderness. Left sinus exhibits no maxillary sinus tenderness and no frontal sinus tenderness.  Mouth/Throat: Uvula is midline and oropharynx is clear and moist. No trismus in the jaw. No uvula swelling. No  oropharyngeal exudate, posterior oropharyngeal erythema or tonsillar abscesses. Tonsils are 3+ on the right. Tonsils are 3+ on the left. No tonsillar exudate.  3+ tonsils, not touching uvula, without exudates. Uvula midline. No trismus. No pooling of oral secretions.   Eyes: Conjunctivae and EOM are normal. Pupils are equal, round, and reactive to light.  Neck: Trachea normal and normal range of motion. Neck supple. No JVD present. No neck rigidity. No tracheal deviation and no edema present.  Cardiovascular: Normal rate, regular rhythm, normal heart sounds and intact distal pulses.   No murmur heard. Pulmonary/Chest: Effort normal and breath sounds normal. No respiratory distress. He has no wheezes. He has no rales.  Abdominal: Soft. Bowel sounds are normal. He exhibits no distension. There is no tenderness.  No splenomegaly  Musculoskeletal: Normal range of motion. He exhibits no deformity.  Lymphadenopathy:       Head (right side): No submental, no submandibular, no tonsillar and no preauricular adenopathy present.       Head (left side): No submental, no submandibular, no tonsillar and no preauricular adenopathy present.    He has no cervical adenopathy.       Right cervical: No posterior cervical adenopathy present.      Left cervical: No posterior cervical adenopathy present.  No lymphadenopathy   Neurological: He is alert and oriented to person, place, and time.  Skin: Skin is warm and dry. Capillary refill takes less than 2 seconds.  No rashes  Psychiatric: He has a normal mood and affect. His behavior is normal. Judgment and thought content normal.  Nursing note and vitals reviewed.    ED Treatments / Results  Labs (all labs ordered are listed, but only abnormal results are displayed) Labs Reviewed  CULTURE, GROUP A STREP [facility])    EKG  EKG Interpretation None       Radiology Dg Chest 2 View  Result Date: 08/01/2016 CLINICAL DATA:  Cough for 1 week EXAM: CHEST  2  VIEW COMPARISON:  07/29/2016 FINDINGS: The heart size and mediastinal contours are within normal limits. Both lungs are clear. The visualized skeletal structures are unremarkable. IMPRESSION: No active cardiopulmonary disease. Electronically Signed   By: Alcide Clever M.D.   On: 08/01/2016 14:25    Procedures Procedures (including critical care time)  Medications Ordered in ED Medications  ondansetron (ZOFRAN-ODT) disintegrating tablet 4 mg (4 mg Oral Given 08/01/16 1752)     Initial Impression / Assessment and Plan / ED Course  I have reviewed the triage vital signs and the nursing notes.  Pertinent labs & imaging results that were available during my care of the patient were reviewed by me and considered in my medical decision making (see chart for details).    26 year old with pertinent past medical history of chronic sore throat/throat discomfort with foreign body sensation, abdominal pain 1 year presents to the emergency department from urgent care with fever, nausea, vomiting, abdominal pain, headache, body aches since this morning. Today patient also reports long standing h/o sore throat, tonsillar swelling and discomfort.  Vital signs are reassuring. 3+ tonsillar edema without uvula deviation, no trismus, no pooling of oral secretions, no splenomegaly or rashes. No cervical  lymphadenopathy, neck stiffness, full neck ROM, no anterior neck swelling, no hot potato voice. Rapid strep negative at urgent care today. Doubt peritonsillar abscess or deep neck tissue abscess. Doubt epiglottitis. Patient tolerated PO fluids and food in ED without complications.  Doubt ENT emergency at this time as this problem appears to be chronic.  Patient has appointment with ENT on 3/19 for further evaluation of his chronic sore throat/discomfort.  Patient tells me that his sore throat symptoms have been persistent and unchanged for over one year. Patient is frustrated as he has been told multiple times that he  should be seen by ENT and consider tonsillectomy, however patient states he has been unable to do so due to insurance and financial reasons.  Fever, nausea, vomiting, abdominal pain, headache and body aches from this morning appear to be viral in nature. On exam patient is nontoxic appearing. Vital signs reassuring, abdominal exam benign. Patient states symptoms have improved since Tylenol and Zofran he received an urgent care. He ate and drank fluids in the ED without complications. Patient did not have episodes of emesis in the ED. At this time patient is considered safe for discharge. Doubt intraabdominal emergency.  No further emergent lab work or imaging indicated at this time.  Patient will be discharged with Zofran, fluids, rest in ED return precautions for worsening symptoms. I encouraged patient to follow-up with ENT for ultimate treatment of his chronic sore throat symptoms. Patient given financial resources to help with payment. Patient verbalized understanding, is frustrated but agrees to follow-up with ENT. I offered patient Magic mouthwash, lidocaine gargles, short course of steroids for inflammation however patient states that he has been given all of these remedies in the past and that his symptoms always come back. States he has Magic and lidocaine gargles at home. mouthwash Patient agreeable to discharge plan.  Final Clinical Impressions(s) / ED Diagnoses   Final diagnoses:  Tonsillitis, chronic  Body aches  Nausea  Intractable vomiting with nausea, unspecified vomiting type    New Prescriptions Discharge Medication List as of 08/01/2016  6:27 PM    START taking these medications   Details  ondansetron (ZOFRAN ODT) 4 MG disintegrating tablet Take 1 tablet (4 mg total) by mouth every 8 (eight) hours as needed for nausea or vomiting., Starting Fri 08/01/2016, Print         Liberty Handy, PA-C 08/01/16 1848    Rolan Bucco, MD 08/01/16 1858    Liberty Handy,  PA-C 08/01/16 1909    Rolan Bucco, MD 08/01/16 2037

## 2016-08-01 NOTE — ED Notes (Signed)
Pt sts he knows something is wrong w/him and if we d/c him, he will end up in the ER. Pt sts [facility]... Dr. Chaney MallingMortenson in room w/pt  Tx pt down by shuttle... Adv NPO... Pt verb understanding.

## 2016-08-01 NOTE — ED Triage Notes (Signed)
Pt c/o cold sx onset: 4 days  Sx include: ST, chills, BA, fatigue, prod cough, fevers  [facility] similar sx and went to PCP yest.   Reports hx of mono.   A&O x4... NAD

## 2016-08-01 NOTE — ED Notes (Signed)
Per VO from Dr. Chaney MallingMortenson... Give 800 mg of Ibup

## 2016-08-01 NOTE — ED Notes (Signed)
Pt sts unable to provide urine at this time

## 2016-08-03 ENCOUNTER — Encounter (INDEPENDENT_AMBULATORY_CARE_PROVIDER_SITE_OTHER): Payer: Self-pay | Admitting: Physician Assistant

## 2016-08-03 LAB — CULTURE, GROUP A STREP (THRC)

## 2016-10-02 ENCOUNTER — Encounter (HOSPITAL_COMMUNITY): Payer: Self-pay | Admitting: Emergency Medicine

## 2016-10-02 ENCOUNTER — Emergency Department (HOSPITAL_COMMUNITY): Payer: BLUE CROSS/BLUE SHIELD

## 2016-10-02 ENCOUNTER — Emergency Department (HOSPITAL_COMMUNITY)
Admission: EM | Admit: 2016-10-02 | Discharge: 2016-10-02 | Disposition: A | Discharge status: Home / Self Care | Payer: BLUE CROSS/BLUE SHIELD | Attending: Emergency Medicine | Admitting: Emergency Medicine

## 2016-10-02 DIAGNOSIS — Z79899 Other long term (current) drug therapy: Secondary | ICD-10-CM | POA: Diagnosis not present

## 2016-10-02 DIAGNOSIS — R0981 Nasal congestion: Secondary | ICD-10-CM

## 2016-10-02 DIAGNOSIS — R042 Hemoptysis: Secondary | ICD-10-CM | POA: Diagnosis not present

## 2016-10-02 DIAGNOSIS — J351 Hypertrophy of tonsils: Secondary | ICD-10-CM | POA: Diagnosis not present

## 2016-10-02 MED ORDER — LORATADINE 10 MG PO TABS
10.0000 mg | ORAL_TABLET | Freq: Once | ORAL | Status: AC
  Administered 2016-10-02: 10 mg via ORAL
  Filled 2016-10-02: qty 1

## 2016-10-02 MED ORDER — FLUTICASONE PROPIONATE 50 MCG/ACT NA SUSP: 2.0000 | Freq: Every day | NASAL | 0 refills | Status: DC

## 2016-10-02 MED ORDER — LORATADINE 10 MG PO TABS: 10.0000 mg | ORAL_TABLET | Freq: Every day | ORAL | 0 refills | Status: DC

## 2016-10-02 NOTE — ED Provider Notes (Signed)
[facility]PT Provider Note   CSN: [id number] Arrival date & time: 10/02/16  0751     History   Chief Complaint Chief Complaint  Patient presents with  . Hemoptysis    HPI Gerald Anderson is a 26 y.o. male.  HPI Patient presents with nasal congestion, postnasal drip and cough with thick sputum production. States he has small amount of red blood occasionally in the sputum. No fever or chills. Has ongoing chronically enlarged tonsils. Denies any sore throat or difficulty swallowing. No voice changes. No chest pain but has occasional shortness of breath. None currently. Past Medical History:  Diagnosis Date  . Back pain   . Kawasaki disease [facility](HCC)     Patient Active Problem List   Diagnosis Date Noted  . Acute gangrenous tonsillitis 11/13/2014    History reviewed. No pertinent surgical history.     Home Medications    Prior to Admission medications   Medication Sig Start Date End Date Taking? Authorizing Provider  fluticasone (FLONASE) 50 MCG/ACT nasal spray Place 2 sprays into both nostrils daily. 10/02/16   Loren RacerYelverton, Quanetta Truss, MD  loratadine (CLARITIN) 10 MG tablet Take 1 tablet (10 mg total) by mouth daily. 10/02/16   Loren RacerYelverton, Aerianna Losey, MD  ondansetron (ZOFRAN ODT) 4 MG disintegrating tablet Take 1 tablet (4 mg total) by mouth every 8 (eight) hours as needed for nausea or vomiting. 08/01/16   Liberty HandyGibbons, Claudia J, PA-C  pantoprazole (PROTONIX) 40 MG tablet Take 1 tablet (40 mg total) by mouth daily. 07/29/16   Pricilla LovelessGoldston, Scott, MD  sertraline (ZOLOFT) 25 MG tablet Take 1 tablet (25 mg total) by mouth daily. 07/31/16   Loletta SpecterGomez, Roger Chinenye Katzenberger, PA-C    Family History No family history on file.  Social History Social History  Substance Use Topics  . Smoking status: Never Smoker  . Smokeless tobacco: Never Used  . Alcohol use Yes     Comment: socially     Allergies   Patient has no known allergies.   Review of Systems Review of Systems  Constitutional: Negative for  chills, fatigue and fever.  HENT: Positive for congestion and sinus pressure. Negative for sore throat, trouble swallowing and voice change.   Respiratory: Positive for cough. Negative for shortness of breath.   Cardiovascular: Negative for chest pain, palpitations and leg swelling.  Gastrointestinal: Negative for abdominal pain, constipation, diarrhea, nausea and vomiting.  Musculoskeletal: Negative for back pain, myalgias, neck pain and neck stiffness.  Skin: Negative for rash and wound.  Neurological: Negative for dizziness, weakness, light-headedness, numbness and headaches.  All other systems reviewed and are negative.    Physical Exam Updated Vital Signs BP 116/60 (BP Location: Right Arm)   Pulse (!) 56   Temp 97.5 F (36.4 C) (Oral)   Resp 14   Ht 6\' 1"  (1.854 m)   Wt 153 lb (69.4 kg)   SpO2 100%   BMI 20.19 kg/m   Physical Exam  Constitutional: He is oriented to person, place, and time. He appears well-developed and well-nourished. No distress.  HENT:  Head: Normocephalic and atraumatic.  Mouth/Throat: Oropharynx is clear and moist.  Right greater than left nasal mucosal edema. No sinus tenderness to percussion. Oropharynx is mildly erythematous with tonsillar hypertrophy. No exudates. Uvula is midline.  Eyes: EOM are normal. Pupils are equal, round, and reactive to light.  Neck: Normal range of motion. Neck supple.  Mildly tender right anterior submandibular lymph node. No meningismus.  Cardiovascular: Normal rate and regular rhythm.  Exam reveals no  gallop and no friction rub.   No murmur heard. Pulmonary/Chest: Effort normal and breath sounds normal. No respiratory distress. He has no wheezes. He has no rales. He exhibits no tenderness.  Abdominal: Soft. Bowel sounds are normal. There is no tenderness. There is no rebound and no guarding.  Musculoskeletal: Normal range of motion. He exhibits no edema or tenderness.  Lymphadenopathy:    He has cervical adenopathy.    Neurological: He is alert and oriented to person, place, and time.  Skin: Skin is warm and dry. Capillary refill takes less than 2 seconds. No rash noted. No erythema.  Psychiatric: He has a normal mood and affect. His behavior is normal.  Nursing note and vitals reviewed.    ED Treatments / Results  Labs (all labs ordered are listed, but only abnormal results are displayed) Labs Reviewed - No data to display  EKG  EKG Interpretation None       Radiology Dg Chest 2 View  Result Date: 10/02/2016 CLINICAL DATA:  Hemoptysis for several days, weakness, history, sake disease, nonsmoker. EXAM: CHEST  2 VIEW COMPARISON:  Chest x-ray of August 01, 2016 FINDINGS: The lungs are mildly hyperinflated. There is no focal infiltrate. There is no pleural effusion. The heart and pulmonary vascularity are normal. The mediastinum is normal in width. There is gentle dextrocurvature centered in the mid thoracic spine which is not new. IMPRESSION: Mild hyperinflation may be voluntary or may reflect reactive airway disease. There is no pneumonia, CHF, nor other acute cardiopulmonary abnormality. Electronically Signed   By: Sharonica Kraszewski  [place] M.D.   On: 10/02/2016 09:17    Procedures Procedures (including critical care time)  Medications Ordered in ED Medications  loratadine (CLARITIN) tablet 10 mg (10 mg Oral Given 10/02/16 0848)     Initial Impression / Assessment and Plan / ED Course  I have reviewed the triage vital signs and the nursing notes.  Pertinent labs & imaging results that were available during my care of the patient were reviewed by me and considered in my medical decision making (see chart for details).     Patient is well-appearing. He has chronic tonsillitis. Recommend follow-up with ENT for consideration of outpatient tonsillectomy. Suspect hemoptysis is due to sinus drainage. Lungs are clear. No difficulty breathing or shortness of breath. Will check x-ray to rule out evidence of  pneumonia. Low suspicion for PE.   Final Clinical Impressions(s) / ED Diagnoses   Final diagnoses:  Nasal sinus congestion  Blood-tinged sputum  Tonsillar hypertrophy    New Prescriptions Discharge Medication List as of 10/02/2016 10:05 AM    START taking these medications   Details  fluticasone (FLONASE) 50 MCG/ACT nasal spray Place 2 sprays into both nostrils daily., Starting Thu 10/02/2016, Print    loratadine (CLARITIN) 10 MG tablet Take 1 tablet (10 mg total) by mouth daily., Starting Thu 10/02/2016, Print         Loren Racer, MD 10/02/16 1600

## 2016-10-02 NOTE — ED Triage Notes (Signed)
Patient complains of coughing up blood intermittently for several months.  Also states he has felt like something is stuck in throat for two years.  Patient alert and oriented and in no apparent distress at this time.

## 2017-01-17 ENCOUNTER — Emergency Department (HOSPITAL_COMMUNITY): Payer: BLUE CROSS/BLUE SHIELD

## 2017-01-17 ENCOUNTER — Emergency Department (HOSPITAL_COMMUNITY)
Admission: EM | Admit: 2017-01-17 | Discharge: 2017-01-18 | Disposition: A | Discharge status: Home / Self Care | Payer: BLUE CROSS/BLUE SHIELD | Attending: Emergency Medicine | Admitting: Emergency Medicine

## 2017-01-17 DIAGNOSIS — S61412A Laceration without foreign body of left hand, initial encounter: Secondary | ICD-10-CM | POA: Insufficient documentation

## 2017-01-17 DIAGNOSIS — T07XXXA Unspecified multiple injuries, initial encounter: Secondary | ICD-10-CM

## 2017-01-17 DIAGNOSIS — M545 Low back pain: Secondary | ICD-10-CM | POA: Insufficient documentation

## 2017-01-17 DIAGNOSIS — S81012A Laceration without foreign body, left knee, initial encounter: Secondary | ICD-10-CM | POA: Insufficient documentation

## 2017-01-17 DIAGNOSIS — Y92410 Unspecified street and highway as the place of occurrence of the external cause: Secondary | ICD-10-CM | POA: Insufficient documentation

## 2017-01-17 DIAGNOSIS — S41112A Laceration without foreign body of left upper arm, initial encounter: Secondary | ICD-10-CM

## 2017-01-17 DIAGNOSIS — Y999 Unspecified external cause status: Secondary | ICD-10-CM | POA: Insufficient documentation

## 2017-01-17 DIAGNOSIS — M79602 Pain in left arm: Secondary | ICD-10-CM | POA: Insufficient documentation

## 2017-01-17 DIAGNOSIS — Y939 Activity, unspecified: Secondary | ICD-10-CM | POA: Insufficient documentation

## 2017-01-17 LAB — PROTIME-INR
INR: 1.12
Prothrombin Time: 14.4 s (ref 11.4–15.2)

## 2017-01-17 LAB — COMPREHENSIVE METABOLIC PANEL WITH GFR
ALT: 14 U/L — ABNORMAL LOW (ref 17–63)
AST: 26 U/L (ref 15–41)
Albumin: 4 g/dL (ref 3.5–5.0)
Alkaline Phosphatase: 36 U/L — ABNORMAL LOW (ref 38–126)
Anion gap: 11 (ref 5–15)
BUN: 10 mg/dL (ref 6–20)
CO2: 22 mmol/L (ref 22–32)
Calcium: 9.1 mg/dL (ref 8.9–10.3)
Chloride: 107 mmol/L (ref 101–111)
Creatinine, Ser: 1.19 mg/dL (ref 0.61–1.24)
GFR calc Af Amer: >60 mL/min
GFR calc non Af Amer: >60 mL/min
Glucose, Bld: 83 mg/dL (ref 65–99)
Potassium: 3.5 mmol/L (ref 3.5–5.1)
Sodium: 140 mmol/L (ref 135–145)
Total Bilirubin: 0.8 mg/dL (ref 0.3–1.2)
Total Protein: 6.6 g/dL (ref 6.5–8.1)

## 2017-01-17 LAB — I-STAT CHEM 8, ED
BUN: 14 mg/dL (ref 6–20)
Calcium, Ion: 1.06 mmol/L — ABNORMAL LOW (ref 1.15–1.40)
Chloride: 106 mmol/L (ref 101–111)
Creatinine, Ser: 1.2 mg/dL (ref 0.61–1.24)
Glucose, Bld: 81 mg/dL (ref 65–99)
HCT: 42 % (ref 39.0–52.0)
Hemoglobin: 14.3 g/dL (ref 13.0–17.0)
Potassium: 3.7 mmol/L (ref 3.5–5.1)
Sodium: 141 mmol/L (ref 135–145)
TCO2: 24 mmol/L (ref 22–32)

## 2017-01-17 LAB — CBC
HCT: 40.5 % (ref 39.0–52.0)
Hemoglobin: 13.7 g/dL (ref 13.0–17.0)
MCH: 29.2 pg (ref 26.0–34.0)
MCHC: 33.8 g/dL (ref 30.0–36.0)
MCV: 86.4 fL (ref 78.0–100.0)
PLATELETS: 207 10*3/uL (ref 150–400)
RBC: 4.69 MIL/uL (ref 4.22–5.81)
RDW: 12.7 % (ref 11.5–15.5)
WBC: 7.7 10*3/uL (ref 4.0–10.5)

## 2017-01-17 LAB — SAMPLE TO BLOOD BANK

## 2017-01-17 LAB — CDS SEROLOGY

## 2017-01-17 LAB — ETHANOL: Alcohol, Ethyl (B): 138 mg/dL — ABNORMAL HIGH (ref <5)

## 2017-01-17 LAB — I-STAT CG4 LACTIC ACID, ED: Lactic Acid, Venous: 4.38 mmol/L (ref 0.5–1.9)

## 2017-01-17 MED ORDER — MORPHINE SULFATE (PF) 4 MG/ML IV SOLN
4.0000 mg | Freq: Once | INTRAVENOUS | Status: AC
  Administered 2017-01-17: 4 mg via INTRAVENOUS

## 2017-01-17 MED ORDER — IOPAMIDOL (ISOVUE-300) INJECTION 61%
INTRAVENOUS | Status: AC
  Administered 2017-01-17: 100 mL
  Filled 2017-01-17: qty 100

## 2017-01-17 MED ORDER — MORPHINE SULFATE (PF) 4 MG/ML IV SOLN
INTRAVENOUS | Status: AC
  Filled 2017-01-17: qty 1

## 2017-01-17 MED ORDER — LIDOCAINE HCL (PF) 1 % IJ SOLN
30.0000 mL | Freq: Once | INTRAMUSCULAR | Status: DC
  Filled 2017-01-17: qty 30

## 2017-01-17 MED ORDER — CEFAZOLIN SODIUM-DEXTROSE 1-4 GM/50ML-% IV SOLN
1.0000 g | Freq: Once | INTRAVENOUS | Status: AC
  Administered 2017-01-17: 1 g via INTRAVENOUS
  Filled 2017-01-17: qty 50

## 2017-01-17 NOTE — ED Provider Notes (Signed)
LACERATION REPAIR Performed by: Roxy Horseman Authorized by: Roxy Horseman Consent: Verbal consent obtained. Risks and benefits: risks, benefits and alternatives were discussed Consent given by: patient Patient identity confirmed: provided demographic data Prepped and Draped in normal sterile fashion Wound explored Complex  Laceration Location: left knee  Laceration Length: 3 cm  No Foreign Bodies seen or palpated  Anesthesia: local infiltration  Local anesthetic: lidocaine 1% without epinephrine  Anesthetic total: 5 ml  Irrigation method: syringe Amount of cleaning: standard  Skin closure: subcutaneous, 4-0 vicryl  Number of sutures: 4  Technique: interrupted  Patient tolerance: Patient tolerated the procedure well with no immediate complications.  LACERATION REPAIR Performed by: Roxy Horseman Authorized by: Roxy Horseman Consent: Verbal consent obtained. Risks and benefits: risks, benefits and alternatives were discussed Consent given by: patient Patient identity confirmed: provided demographic data Prepped and Draped in normal sterile fashion Wound explored  Laceration Location: left knee  Laceration Length: 4cm  No Foreign Bodies seen or palpated  Anesthesia: local infiltration  Local anesthetic: lidocaine 1% without epinephrine  Anesthetic total: 5 ml  Irrigation method: syringe Amount of cleaning: standard  Skin closure: 4-0 prolene  Number of sutures: 3  Technique: mattress  Patient tolerance: Patient tolerated the procedure well with no immediate complications.  LACERATION REPAIR Performed by: Roxy Horseman Authorized by: Roxy Horseman Consent: Verbal consent obtained. Risks and benefits: risks, benefits and alternatives were discussed Consent given by: patient Patient identity confirmed: provided demographic data Prepped and Draped in normal sterile fashion Wound explored  Laceration Location: left  hand  Laceration Length: 2cm  No Foreign Bodies seen or palpated  Anesthesia: local infiltration  Local anesthetic: lidocaine 1% without epinephrine  Anesthetic total: 3 ml  Irrigation method: syringe Amount of cleaning: standard  Skin closure: 5-0 prolene  Number of sutures: 5  Technique: interrupted  Patient tolerance: Patient tolerated the procedure well with no immediate complications.  LACERATION REPAIR Performed by: Roxy Horseman Authorized by: Roxy Horseman Consent: Verbal consent obtained. Risks and benefits: risks, benefits and alternatives were discussed Consent given by: patient Patient identity confirmed: provided demographic data Prepped and Draped in normal sterile fashion Wound explored  Laceration Location: left hand  Laceration Length: 2 cm  No Foreign Bodies seen or palpated  Anesthesia: local infiltration  Local anesthetic: lidocaine 1% without epinephrine  Anesthetic total: 2 ml  Irrigation method: syringe Amount of cleaning: standard  Skin closure: 5-0 prolene  Number of sutures: 2  Technique: interrupted, wound was approximated, there was not enough viable tissue for a tight closure.  Patient tolerance: Patient tolerated the procedure well with no immediate complications.     Roxy Horseman, PA-C 01/18/17 6599    Rolan Bucco, MD 01/18/17 [id number]
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
## 2017-01-17 NOTE — ED Notes (Signed)
Patient transported to CT 

## 2017-01-18 ENCOUNTER — Emergency Department (HOSPITAL_COMMUNITY): Payer: BLUE CROSS/BLUE SHIELD

## 2017-01-18 MED ORDER — CEPHALEXIN 500 MG PO CAPS: 500.0000 mg | ORAL_CAPSULE | Freq: Three times a day (TID) | ORAL | 0 refills | Status: DC

## 2017-01-18 MED ORDER — IBUPROFEN 600 MG PO TABS: 600.0000 mg | ORAL_TABLET | Freq: Three times a day (TID) | ORAL | 0 refills | Status: DC | PRN

## 2017-01-18 MED ORDER — MORPHINE SULFATE (PF) 4 MG/ML IV SOLN
6.0000 mg | Freq: Once | INTRAVENOUS | Status: AC
  Administered 2017-01-18: 6 mg via INTRAVENOUS
  Filled 2017-01-18: qty 2

## 2017-01-18 MED ORDER — LORAZEPAM 2 MG/ML IJ SOLN: 1.0000 mg | Freq: Once | INTRAMUSCULAR | Status: DC

## 2017-01-18 MED ORDER — HYDROCODONE-ACETAMINOPHEN 5-325 MG PO TABS
1.0000 | ORAL_TABLET | Freq: Once | ORAL | Status: AC
  Administered 2017-01-18: 1 via ORAL
  Filled 2017-01-18: qty 1

## 2017-01-18 MED ORDER — HYDROCODONE-ACETAMINOPHEN 5-325 MG PO TABS: 1.0000 | ORAL_TABLET | Freq: Four times a day (QID) | ORAL | 0 refills | Status: DC | PRN

## 2017-01-18 NOTE — Discharge Instructions (Signed)
Suture removal in 10 days.  Please take ibuprofen for the pain and use the muscle relaxants and hydrocodone for breakthrough pain  Return in the emergency department for any new or worsening symptoms  Return the emergency department for signs of infection as we discussed.

## 2017-01-18 NOTE — ED Notes (Signed)
Pt was upset after MD left room stating that his imaging was fine and he was going to be discharged. RN tried to console patient and offered for provider to come back into the room. Provider came back and charge nurse accompanied and witnessed the conversation. Pt still was unhappy and RN got charge nurse to speak with patient. Charge nurse notified RN when patient was ready for discharge
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
## 2017-01-18 NOTE — ED Notes (Signed)
Patient transported to MRI 

## 2017-01-18 NOTE — ED Provider Notes (Signed)
Patient can move his legs now.  MRI of his total spine is without abnormality.  Home with antibiotics.  Infection warnings given.  Patient understands to return to the ER for new or worsening symptoms   Azalia Bilis, MD 01/18/17 [phone]

## 2017-01-18 NOTE — Progress Notes (Signed)
Orthopedic Tech Progress Note Patient Details:  Gerald Anderson 01/23/1991 [id number]  Ortho Devices Type of Ortho Device: Crutches Ortho Device/Splint Interventions: Ordered, Application, Adjustment   Trinna Post 01/18/2017, 6:26 AM

## 2017-01-18 NOTE — ED Notes (Signed)
Pt understood dc material. Scripts given at dc. NAD noted 

## 2017-01-25 ENCOUNTER — Emergency Department (HOSPITAL_COMMUNITY)
Admission: EM | Admit: 2017-01-25 | Discharge: 2017-01-25 | Disposition: A | Discharge status: Home / Self Care | Payer: PRIVATE HEALTH INSURANCE | Attending: Emergency Medicine | Admitting: Emergency Medicine

## 2017-01-25 ENCOUNTER — Encounter (HOSPITAL_COMMUNITY): Payer: Self-pay | Admitting: Nurse Practitioner

## 2017-01-25 DIAGNOSIS — Z5189 Encounter for other specified aftercare: Secondary | ICD-10-CM | POA: Insufficient documentation

## 2017-01-25 DIAGNOSIS — Z4802 Encounter for removal of sutures: Secondary | ICD-10-CM | POA: Insufficient documentation

## 2017-01-25 MED ORDER — TRAMADOL HCL 50 MG PO TABS: 50.0000 mg | ORAL_TABLET | Freq: Four times a day (QID) | ORAL | 0 refills | Status: DC | PRN

## 2017-01-25 NOTE — ED Provider Notes (Signed)
[facility]-EMERGENCY DEPT Provider Note   CSN: [id number] Arrival date & time: 01/25/17  1711     History   Chief Complaint No chief complaint on file.   HPI Gerald Anderson is a 26 y.o. male who presents to the ED for suture removal. Patient states he continues to have pain in the left knee and has noted swelling. Pain increases with ambulation.   HPI  Past Medical History:  Diagnosis Date  . Back pain   . Kawasaki disease [facility](HCC)     Patient Active Problem List   Diagnosis Date Noted  . Acute gangrenous tonsillitis 11/13/2014    History reviewed. No pertinent surgical history.     Home Medications    Prior to Admission medications   Medication Sig Start Date End Date Taking? Authorizing Provider  cephALEXin (KEFLEX) 500 MG capsule Take 1 capsule (500 mg total) by mouth 3 (three) times daily. 01/18/17   Azalia Bilisampos, Kevin, MD  HYDROcodone-acetaminophen (NORCO/VICODIN) 5-325 MG tablet Take 1 tablet by mouth every 6 (six) hours as needed for moderate pain. 01/18/17   Azalia Bilisampos, Kevin, MD  ibuprofen (ADVIL,MOTRIN) 600 MG tablet Take 1 tablet (600 mg total) by mouth every 8 (eight) hours as needed. 01/18/17   Azalia Bilisampos, Kevin, MD  traMADol (ULTRAM) 50 MG tablet Take 1 tablet (50 mg total) by mouth every 6 (six) hours as needed. 01/25/17   Janne NapoleonNeese, Deysi Soldo M, NP    Family History History reviewed. No pertinent family history.  Social History Social History  Substance Use Topics  . Smoking status: Never Smoker  . Smokeless tobacco: Never Used  . Alcohol use Yes     Comment: socially     Allergies   Patient has no known allergies.   Review of Systems Review of Systems  Musculoskeletal: Positive for arthralgias.       Left knee  Skin: Positive for wound.       Abrasions to left chest wall, left arm and left knee  All other systems reviewed and are negative.    Physical Exam Updated Vital Signs BP (!) 144/87 (BP Location: Left Arm)   Pulse 76   Temp 98.6 F (37 C) (Oral)   Resp  20   SpO2 100%   Physical Exam  Constitutional: He is oriented to person, place, and time. He appears well-developed and well-nourished. No distress.  HENT:  Head: Normocephalic.  Eyes: EOM are normal.  Neck: Neck supple.  Cardiovascular: Normal rate.   Pulmonary/Chest: Effort normal.  Musculoskeletal:       Left knee: He exhibits swelling and laceration. He exhibits no deformity. Decreased range of motion: due to pain. Tenderness found.  Left knee with healing abrasions and laceration. Thick scabbing to the wound.   Neurological: He is alert and oriented to person, place, and time. No cranial nerve deficit.  Skin:  Multiple abrasions to the left side of the body healing well.  Psychiatric: He has a normal mood and affect.  Nursing note and vitals reviewed.  RN removed sutures from the hand and 2 from the knee but could not find the 3rd.  Using magnifying loops I removed the suture that was under the crusted wound.  ED Treatments / Results Abrasion to the left knee cleaned with Betadine scrub brush and irrigated with NSS. Xeroform gauze and dressing applied. Knee immobilizer for comfort. Instructions to patient regarding wound care.   Labs (all labs ordered are listed, but only abnormal results are displayed) Labs Reviewed - No data to display  Radiology No results found.  Procedures Procedures (including critical care time)  Medications Ordered in ED Medications - No data to display   Initial Impression / Assessment and Plan / ED Course  I have reviewed the triage vital signs and the nursing notes. 26 y.o. male here for wound check and suture removal and continued pain to the left knee stable for d/c without signs of infection. Knee immobilizer given. Patient states he has crutches at home if he wants to use them. He will continue his antibiotics and wound care. He will f/u with ortho if knee pain persist.   Final Clinical Impressions(s) / ED Diagnoses   Final  diagnoses:  Visit for wound check  Visit for suture removal    New Prescriptions New Prescriptions   TRAMADOL (ULTRAM) 50 MG TABLET    Take 1 tablet (50 mg total) by mouth every 6 (six) hours as needed.     Kerrie [place], [place] 01/25/17 2018    Cathren Laine, MD 01/27/17 [phone]

## 2017-01-25 NOTE — Discharge Instructions (Signed)
The knee immobilizer is for your comfort.  Clean your wounds and apply antibiotic ointment at least once a day.  Follow up with Dr. Ave Filterhandler if your knee pain continues.  Do not drive while taking the narcotic pain medication. Return here as needed.

## 2017-01-25 NOTE — ED Triage Notes (Signed)
Pt is returning for suture removal and re-evaluation of his left knee pain.

## 2017-02-02 ENCOUNTER — Encounter (HOSPITAL_COMMUNITY): Payer: Self-pay | Admitting: Nurse Practitioner

## 2017-02-02 ENCOUNTER — Emergency Department (HOSPITAL_COMMUNITY)
Admission: EM | Admit: 2017-02-02 | Discharge: 2017-02-02 | Disposition: A | Discharge status: Home / Self Care | Payer: Medicaid - Out of State | Attending: Emergency Medicine | Admitting: Emergency Medicine

## 2017-02-02 DIAGNOSIS — Z4802 Encounter for removal of sutures: Secondary | ICD-10-CM

## 2017-02-02 DIAGNOSIS — S81012D Laceration without foreign body, left knee, subsequent encounter: Secondary | ICD-10-CM | POA: Insufficient documentation

## 2017-02-02 DIAGNOSIS — Y33XXXD Other specified events, undetermined intent, subsequent encounter: Secondary | ICD-10-CM | POA: Diagnosis not present

## 2017-02-02 NOTE — ED Provider Notes (Signed)
[facility]-EMERGENCY DEPT Provider Note   CSN: [id number] Arrival date & time: 02/02/17  1308     History   Chief Complaint Chief Complaint  Patient presents with  . Suture / Staple Removal    HPI Gerald Anderson is a 26 y.o. male who presents for suture removal. Patient was seen at [facility] ED on 9//18 for his initial suture removal. Patient states that when he got home, he noticed that there is still a suture left in place, probably ED visit. Patient states that he stopped taking his antibiotic. He reports that he has been able in delay but still expenses mild pain to the left knee. Patient denies any fever, chills, numbness/weakness.  The history is provided by the patient.    Past Medical History:  Diagnosis Date  . Back pain   . Kawasaki disease [facility](HCC)     Patient Active Problem List   Diagnosis Date Noted  . Acute gangrenous tonsillitis 11/13/2014    History reviewed. No pertinent surgical history.     Home Medications    Prior to Admission medications   Medication Sig Start Date End Date Taking? Authorizing Provider  cephALEXin (KEFLEX) 500 MG capsule Take 1 capsule (500 mg total) by mouth 3 (three) times daily. 01/18/17   Azalia Bilisampos, Kevin, MD  HYDROcodone-acetaminophen (NORCO/VICODIN) 5-325 MG tablet Take 1 tablet by mouth every 6 (six) hours as needed for moderate pain. 01/18/17   Azalia Bilisampos, Kevin, MD  ibuprofen (ADVIL,MOTRIN) 600 MG tablet Take 1 tablet (600 mg total) by mouth every 8 (eight) hours as needed. 01/18/17   Azalia Bilisampos, Kevin, MD  traMADol (ULTRAM) 50 MG tablet Take 1 tablet (50 mg total) by mouth every 6 (six) hours as needed. 01/25/17   Janne NapoleonNeese, Hope M, NP    Family History History reviewed. No pertinent family history.  Social History Social History  Substance Use Topics  . Smoking status: Never Smoker  . Smokeless tobacco: Never Used  . Alcohol use Yes     Comment: socially     Allergies   Patient has no known allergies.   Review of Systems Review  of Systems  Constitutional: Negative for fever.  Skin: Positive for wound.  Neurological: Negative for weakness and numbness.     Physical Exam Updated Vital Signs BP 137/84   Pulse 70   Temp 98 F (36.7 C) (Oral)   Resp 16   SpO2 100%   Physical Exam  Constitutional: He appears well-developed and well-nourished.  Sitting comfortably on examination table  HENT:  Head: Normocephalic and atraumatic.  Eyes: Conjunctivae and EOM are normal. Right eye exhibits no discharge. Left eye exhibits no discharge. No scleral icterus.  Pulmonary/Chest: Effort normal.  Musculoskeletal:  Negative anterior and posterior drawer test. Mild instability on varus stress. No deformity or crepitus noted to the left knee. Flexion/extension of left knee intact without difficulty.  Neurological: He is alert.  Skin: Skin is warm and dry. Capillary refill takes less than 2 seconds.  Well-healed scabbed abrasions noted to the left forearm. Well-healed scabbed over abrasions to the left knee with 1 suture left in place. No surrounding warmth, erythema.  Psychiatric: He has a normal mood and affect. His speech is normal and behavior is normal.  Nursing note and vitals reviewed.    ED Treatments / Results  Labs (all labs ordered are listed, but only abnormal results are displayed) Labs Reviewed - No data to display  EKG  EKG Interpretation None       Radiology  No results found.  Procedures Procedures (including critical care time)  Medications Ordered in ED Medications - No data to display   Initial Impression / Assessment and Plan / ED Course  I have reviewed the triage vital signs and the nursing notes.  Pertinent labs & imaging results that were available during my care of the patient were reviewed by me and considered in my medical decision making (see chart for details).     26 year old male who presents with visit for suture removal. Patient initially had his stitches removed on  9//18. While he was at home, he noticed another suture that was left in place, prompting ED visit. Patient is afebrile, non-toxic appearing, sitting comfortably on examination table. Vital signs reviewed and stable. Physical exam shows one suture in place to the anterior aspect of left knee. Suture removed without difficulty. The remaining abrasions evaluated with no signs of leftover sutures. Offered patient knee sleeve, knee immobilizer for continued knee pain but he declined at this time. Offered to apply an Ace bandage for support and stabilization. Patient states that he does not want anything on the knee. Will plan to give orthopedic referral for further evaluation. Strict return precautions discussed. Patient expresses understanding and agreement to plan.    Final Clinical Impressions(s) / ED Diagnoses   Final diagnoses:  Visit for suture removal    New Prescriptions New Prescriptions   No medications on file     Rosana Hoes 02/02/17 1502    Raeford Razor, MD 02/13/17 [phone]

## 2017-02-02 NOTE — ED Triage Notes (Addendum)
Pt presents with c/o suture removal. He had sut[facility] from [facility] on [facility], but found another suture left underneath a scab today. He reports continued pain in the knee when he ambulates, but he can not afford to follow up with orthopedics as instructed.

## 2017-02-02 NOTE — ED Notes (Signed)
Declined W/C at D/C and was escorted to lobby by RN. 

## 2017-02-02 NOTE — Discharge Instructions (Signed)
Continued to keep the wound clean and dry.  Finish antibiotics as directed. He can take Tylenol or ibuprofen as needed for the pain.  You been provided with an orthopedic doctor for further evaluation. If he continued chest. The pain, follow up with orthopedic doctor. Return to the emergency department for any worsening pain, redness/swelling of the knee, fevers or any other worsening or concerning symptoms.

## 2018-03-20 ENCOUNTER — Emergency Department (HOSPITAL_COMMUNITY)
Admission: EM | Admit: 2018-03-20 | Discharge: 2018-03-20 | Disposition: A | Discharge status: Home / Self Care | Payer: BLUE CROSS/BLUE SHIELD | Attending: Emergency Medicine | Admitting: Emergency Medicine

## 2018-03-20 ENCOUNTER — Emergency Department (HOSPITAL_COMMUNITY): Payer: BLUE CROSS/BLUE SHIELD

## 2018-03-20 ENCOUNTER — Encounter (HOSPITAL_COMMUNITY): Payer: Self-pay

## 2018-03-20 DIAGNOSIS — F10929 Alcohol use, unspecified with intoxication, unspecified: Secondary | ICD-10-CM | POA: Insufficient documentation

## 2018-03-20 DIAGNOSIS — R945 Abnormal results of liver function studies: Secondary | ICD-10-CM | POA: Insufficient documentation

## 2018-03-20 DIAGNOSIS — R112 Nausea with vomiting, unspecified: Secondary | ICD-10-CM | POA: Insufficient documentation

## 2018-03-20 DIAGNOSIS — R1011 Right upper quadrant pain: Secondary | ICD-10-CM | POA: Diagnosis not present

## 2018-03-20 DIAGNOSIS — R1013 Epigastric pain: Secondary | ICD-10-CM | POA: Diagnosis present

## 2018-03-20 DIAGNOSIS — R7989 Other specified abnormal findings of blood chemistry: Secondary | ICD-10-CM

## 2018-03-20 LAB — I-STAT TROPONIN, ED: Troponin i, poc: 0 ng/mL (ref 0.00–0.08)

## 2018-03-20 LAB — COMPREHENSIVE METABOLIC PANEL
ALBUMIN: 4.6 g/dL (ref 3.5–5.0)
ALK PHOS: 64 U/L (ref 38–126)
ALT: 413 U/L — ABNORMAL HIGH (ref 0–44)
ANION GAP: 18 — AB (ref 5–15)
AST: 1481 U/L — ABNORMAL HIGH (ref 15–41)
BUN: 20 mg/dL (ref 6–20)
CALCIUM: 9.2 mg/dL (ref 8.9–10.3)
CO2: 17 mmol/L — AB (ref 22–32)
Chloride: 106 mmol/L (ref 98–111)
Creatinine, Ser: 1.26 mg/dL — ABNORMAL HIGH (ref 0.61–1.24)
GFR calc Af Amer: >60 mL/min (ref >60)
GFR calc non Af Amer: >60 mL/min (ref >60)
Glucose, Bld: 60 mg/dL — ABNORMAL LOW (ref 70–99)
POTASSIUM: 4.2 mmol/L (ref 3.5–5.1)
SODIUM: 141 mmol/L (ref 135–145)
Total Bilirubin: 1.3 mg/dL — ABNORMAL HIGH (ref 0.3–1.2)
Total Protein: 7.4 g/dL (ref 6.5–8.1)

## 2018-03-20 LAB — LIPASE, BLOOD: Lipase: 22 U/L (ref 11–51)

## 2018-03-20 LAB — RAPID URINE DRUG SCREEN, HOSP PERFORMED
Amphetamines: NOT DETECTED
Barbiturates: NOT DETECTED
Benzodiazepines: NOT DETECTED
Cocaine: NOT DETECTED
Opiates: NOT DETECTED
TETRAHYDROCANNABINOL: POSITIVE — AB

## 2018-03-20 LAB — URINALYSIS, ROUTINE W REFLEX MICROSCOPIC
BILIRUBIN URINE: NEGATIVE
Glucose, UA: NEGATIVE mg/dL
Hgb urine dipstick: NEGATIVE
KETONES UR: 20 mg/dL — AB
LEUKOCYTES UA: NEGATIVE
Nitrite: NEGATIVE
Protein, ur: NEGATIVE mg/dL
SPECIFIC GRAVITY, URINE: 1.019 (ref 1.005–1.030)
pH: 5 (ref 5.0–8.0)

## 2018-03-20 LAB — ACETAMINOPHEN LEVEL

## 2018-03-20 LAB — PROTIME-INR
INR: 1.29
Prothrombin Time: 16 seconds — ABNORMAL HIGH (ref 11.4–15.2)

## 2018-03-20 LAB — CBC
HCT: 47.8 % (ref 39.0–52.0)
Hemoglobin: 15.1 g/dL (ref 13.0–17.0)
MCH: 29 pg (ref 26.0–34.0)
MCHC: 31.6 g/dL (ref 30.0–36.0)
MCV: 91.7 fL (ref 80.0–100.0)
NRBC: 0 % (ref 0.0–0.2)
PLATELETS: 224 10*3/uL (ref 150–400)
RBC: 5.21 MIL/uL (ref 4.22–5.81)
RDW: 11.9 % (ref 11.5–15.5)
WBC: 12.9 10*3/uL — ABNORMAL HIGH (ref 4.0–10.5)

## 2018-03-20 LAB — ETHANOL: Alcohol, Ethyl (B): 121 mg/dL — ABNORMAL HIGH (ref <10)

## 2018-03-20 MED ORDER — PANTOPRAZOLE SODIUM 20 MG PO TBEC: 20.0000 mg | DELAYED_RELEASE_TABLET | Freq: Every day | ORAL | 0 refills | Status: DC

## 2018-03-20 MED ORDER — ONDANSETRON 4 MG PO TBDP: 4.0000 mg | ORAL_TABLET | ORAL | 0 refills | Status: DC | PRN

## 2018-03-20 MED ORDER — ONDANSETRON 4 MG PO TBDP
4.0000 mg | ORAL_TABLET | Freq: Once | ORAL | Status: AC | PRN
  Administered 2018-03-20: 4 mg via ORAL
  Filled 2018-03-20 (×2): qty 1

## 2018-03-20 MED ORDER — KETOROLAC TROMETHAMINE 30 MG/ML IJ SOLN
30.0000 mg | Freq: Once | INTRAMUSCULAR | Status: AC
  Administered 2018-03-20: 30 mg via INTRAVENOUS
  Filled 2018-03-20: qty 1

## 2018-03-20 MED ORDER — SODIUM CHLORIDE 0.9 % IV BOLUS
1000.0000 mL | Freq: Once | INTRAVENOUS | Status: AC
  Administered 2018-03-20: 1000 mL via INTRAVENOUS

## 2018-03-20 MED ORDER — METOCLOPRAMIDE HCL 5 MG/ML IJ SOLN
10.0000 mg | Freq: Once | INTRAMUSCULAR | Status: AC
  Administered 2018-03-20: 10 mg via INTRAVENOUS
  Filled 2018-03-20: qty 2

## 2018-03-20 NOTE — ED Provider Notes (Signed)
Patient excepted signout from Dr. Jacqulyn Bath.  Plan to review ultrasound, acetaminophen level, PT/INR and make final disposition.  I have reviewed results with the patient.  I have reviewed with the patient the potential of hepatitis which could be viral or alcohol induced.  Patient denies any history of alcohol withdrawal syndrome.  He does drink alcohol on a daily basis but never experiences withdrawal.  He can go to work and have a normal day without symptoms.  He has however been drinking since 27 years of age.  Patient is alert and clinically well in appearance.  His mental status is clear.  He is well-nourished well-developed.  He shows no signs of alcohol withdrawal.  I feel patient stable for discharge.  He has been counseled on the follow-up plan of getting established with a PCP as well as gastroenterology.  Return precautions reviewed. Physical Exam  BP 122/73 (BP Location: Right Arm)   Pulse 84   Temp 97.6 F (36.4 C) (Axillary)   Resp 16   SpO2 100%   Physical Exam  ED Course/Procedures   Clinical Course as of Mar 20 2001  Sat Mar 20, 2018  1722 Patient has requested something for headache from nursing staff.  Ultrasound is normal.  Will provide Toradol for generalized headache and patient is cleared for sips of water.  Still awaiting results PT and Tylenol level for final disposition.   [MP]    Clinical Course User Index [MP] Arby Barrette, MD    Procedures  MDM        Arby Barrette, MD 03/20/18 2011

## 2018-03-20 NOTE — Discharge Instructions (Signed)
1.  Use the referral number in your discharge instructions to find a family doctor as soon as possible.  You need ongoing monitoring of your liver function tests.  You have diagnostic tests that will not result today that we will need to review for hepatitis and HIV. 2.  Stop drinking alcohol, do not use any products containing acetaminophen (AKA Tylenol). 3.  Call [facility] to schedule a follow-up appointment. 4.  Return to the emergency department if you develop fever, worsening pain, persistent nausea and vomiting, your skin or eyes have a yellowish appearance.

## 2018-03-20 NOTE — ED Provider Notes (Signed)
Emergency Department Provider Note   I have reviewed the triage vital signs and the nursing notes.   HISTORY  Chief Complaint Chest Pain and Abdominal Pain   HPI Gerald Anderson is a 27 y.o. male with PMH of EtOH abuse to the emergency department with epigastric abdominal pain and vomiting.  The patient drinks liquor almost every day.  He denies any fevers or chills.  No change with eating.  He does have some radiation of this pain up into his lower chest.  Denies shortness of breath or heart palpitations.  No blood in the emesis.  He is having bowel movements.   Past Medical History:  Diagnosis Date  . Back pain   . Kawasaki disease [facility])     Patient Active Problem List   Diagnosis Date Noted  . Acute gangrenous tonsillitis 11/13/2014    History reviewed. No pertinent surgical history.   Allergies Patient has no known allergies.  History reviewed. No pertinent family history.  Social History Social History   Tobacco Use  . Smoking status: Never Smoker  . Smokeless tobacco: Never Used  Substance Use Topics  . Alcohol use: Yes    Comment: socially  . Drug use: No    Review of Systems  Constitutional: No fever/chills Eyes: No visual changes. ENT: No sore throat. Cardiovascular: Denies chest pain. Respiratory: Denies shortness of breath. Gastrointestinal: Positive abdominal pain. Positive nausea and vomiting.  No diarrhea.  No constipation. Genitourinary: Negative for dysuria. Musculoskeletal: Negative for back pain. Skin: Negative for rash. Neurological: Negative for headaches, focal weakness or numbness.  10-point ROS otherwise negative.  ____________________________________________   PHYSICAL EXAM:  VITAL SIGNS: ED Triage Vitals  Enc Vitals Group     BP 03/20/18 1258 130/84     Pulse Rate 03/20/18 1258 97     Resp 03/20/18 1258 16     Temp 03/20/18 1258 97.6 F (36.4 C)     Temp Source 03/20/18 1258 Axillary     SpO2 03/20/18 1258 100 %    Pain Score 03/20/18 1256 10   Constitutional: Alert and oriented. Well appearing and in no acute distress. Eyes: Conjunctivae are normal.  Head: Atraumatic. Nose: No congestion/rhinnorhea. Mouth/Throat: Mucous membranes are moist.  Oropharynx non-erythematous. Neck: No stridor.  Cardiovascular: Normal rate, regular rhythm. Good peripheral circulation. Grossly normal heart sounds.   Respiratory: Normal respiratory effort.  No retractions. Lungs CTAB. Gastrointestinal: Soft with mild epigastric tenderness. No rebound or guarding. No distention.  Musculoskeletal: No lower extremity tenderness nor edema. No gross deformities of extremities. Neurologic:  Normal speech and language. No gross focal neurologic deficits are appreciated.  Skin:  Skin is warm, dry and intact. No rash noted.  ____________________________________________   LABS (all labs ordered are listed, but only abnormal results are displayed)  Labs Reviewed  CBC - Abnormal; Notable for the following components:      Result Value   WBC 12.9 (*)    All other components within normal limits  COMPREHENSIVE METABOLIC PANEL - Abnormal; Notable for the following components:   CO2 17 (*)    Glucose, Bld 60 (*)    Creatinine, Ser 1.26 (*)    AST 1,481 (*)    ALT 413 (*)    Total Bilirubin 1.3 (*)    Anion gap 18 (*)    All other components within normal limits  URINALYSIS, ROUTINE W REFLEX MICROSCOPIC - Abnormal; Notable for the following components:   Ketones, ur 20 (*)    All other  components within normal limits  RAPID URINE DRUG SCREEN, HOSP PERFORMED - Abnormal; Notable for the following components:   Tetrahydrocannabinol POSITIVE (*)    All other components within normal limits  ETHANOL - Abnormal; Notable for the following components:   Alcohol, Ethyl (B) 121 (*)    All other components within normal limits  LIPASE, BLOOD  PROTIME-INR  ACETAMINOPHEN LEVEL  I-STAT TROPONIN, ED    ____________________________________________  EKG   EKG Interpretation  Date/Time:  Saturday March 20 2018 12:52:24 EDT Ventricular Rate:  92 PR Interval:  146 QRS Duration: 102 QT Interval:  382 QTC Calculation: 472 R Axis:   85 Text Interpretation:  Normal sinus rhythm Biatrial enlargement Abnormal ECG No STEMI.  Confirmed by Alona Bene [phone]) on 03/20/2018 2:07:47 PM       ____________________________________________  RADIOLOGY  Dg Chest 2 View  Result Date: 03/20/2018 CLINICAL DATA:  , Socket disease. Bilateral chest pain and dyspnea x3 days. EXAM: CHEST - 2 VIEW COMPARISON:  01/17/2017 FINDINGS: The heart size and mediastinal contours are within normal limits. Both lungs are clear. No pneumothorax or consolidations. No effusion. The visualized skeletal structures are unremarkable. IMPRESSION: No active cardiopulmonary disease. Electronically Signed   By: Tollie Eth M.D.   On: 03/20/2018 14:37   US Abdomen Limited Ruq  Result Date: 03/20/2018 CLINICAL DATA:  Right upper quadrant abdominal pain for 3 days. EXAM: ULTRASOUND ABDOMEN LIMITED RIGHT UPPER QUADRANT COMPARISON:  None. FINDINGS: Gallbladder: No gallstones or wall thickening visualized. No sonographic Murphy sign noted by sonographer. Common bile duct: Diameter: 1.7 mm Liver: Normal echogenicity without focal lesion or biliary dilatation. Portal vein is patent on color Doppler imaging with normal direction of blood flow towards the liver. IMPRESSION: Normal right upper quadrant ultrasound examination. Electronically Signed   By: Rudie Meyer M.D.   On: 03/20/2018 16:56    ____________________________________________   PROCEDURES  Procedure(s) performed:   Procedures  None ____________________________________________   INITIAL IMPRESSION / ASSESSMENT AND PLAN / ED COURSE  Pertinent labs & imaging results that were available during my care of the patient were reviewed by me and considered in my  medical decision making (see chart for details).  She presents to the emergency department for evaluation of epigastric abdominal pain with nausea and vomiting.  He has significant elevation in his AST relative to ALT.  Slight elevation in bilirubin.  Suspect this is related to his chronic alcohol use.  He has not been taking Tylenol.  I plan for nausea control along with fluids.  He will receive a right upper quadrant ultrasound to rule out obstructive etiology.   CXR reviewed with no acute findings. RUQ [place] pending. Care transferred to Dr. Donnald Garre who will follow RUQ [place] and reassess.  ____________________________________________  FINAL CLINICAL IMPRESSION(S) / ED DIAGNOSES  Final diagnoses:  RUQ pain  Elevated LFTs  Alcoholic intoxication with complication (HCC)  Non-intractable vomiting with nausea, unspecified vomiting type    MEDICATIONS GIVEN DURING THIS VISIT:  Medications  ketorolac (TORADOL) 30 MG/ML injection 30 mg (has no administration in time range)  ondansetron (ZOFRAN-ODT) disintegrating tablet 4 mg (4 mg Oral Given 03/20/18 1516)  sodium chloride 0.9 % bolus 1,000 mL (0 mLs Intravenous Stopped 03/20/18 1749)  metoCLOPramide (REGLAN) injection 10 mg (10 mg Intravenous Given 03/20/18 1517)    Note:  This document was prepared using Dragon voice recognition software and may include unintentional dictation errors.  Alona Bene, MD Emergency Medicine    [place] Yearby, Arlyss Repress, MD 03/20/18  1750  

## 2018-03-20 NOTE — ED Triage Notes (Signed)
Pt presents for evaluation of chest pain bilaterally and abdominal pain. States he had multiple alcoholic drinks last night which he believes made symptoms worse but was having pain 3-4 days prior.

## 2018-08-03 ENCOUNTER — Other Ambulatory Visit: Payer: Self-pay

## 2018-08-03 ENCOUNTER — Emergency Department (HOSPITAL_COMMUNITY)
Admission: EM | Admit: 2018-08-03 | Discharge: 2018-08-04 | Disposition: A | Discharge status: Home / Self Care | Payer: BLUE CROSS/BLUE SHIELD | Attending: Emergency Medicine | Admitting: Emergency Medicine

## 2018-08-03 ENCOUNTER — Encounter (HOSPITAL_COMMUNITY): Payer: Self-pay | Admitting: *Deleted

## 2018-08-03 DIAGNOSIS — Z5321 Procedure and treatment not carried out due to patient leaving prior to being seen by health care provider: Secondary | ICD-10-CM | POA: Diagnosis not present

## 2018-08-03 DIAGNOSIS — R1110 Vomiting, unspecified: Secondary | ICD-10-CM | POA: Insufficient documentation

## 2018-08-03 MED ORDER — SODIUM CHLORIDE 0.9% FLUSH: 3.0000 mL | Freq: Once | INTRAVENOUS | Status: DC

## 2018-08-03 NOTE — ED Triage Notes (Signed)
Pt arrives with c/o n/v/d since this morning, "fluctuating" temperatures.   Patient aware that we need urine sample for testing, unable at this time. Pt given instruction on providing urine sample when able to do so.

## 2018-08-04 LAB — COMPREHENSIVE METABOLIC PANEL
ALT: 16 U/L (ref 0–44)
AST: 24 U/L (ref 15–41)
Albumin: 4.4 g/dL (ref 3.5–5.0)
Alkaline Phosphatase: 46 U/L (ref 38–126)
Anion gap: 8 (ref 5–15)
BUN: 16 mg/dL (ref 6–20)
CO2: 24 mmol/L (ref 22–32)
Calcium: 9.5 mg/dL (ref 8.9–10.3)
Chloride: 106 mmol/L (ref 98–111)
Creatinine, Ser: 1.08 mg/dL (ref 0.61–1.24)
GFR calc Af Amer: >60 mL/min (ref >60)
GFR calc non Af Amer: >60 mL/min (ref >60)
Glucose, Bld: 89 mg/dL (ref 70–99)
Potassium: 4.2 mmol/L (ref 3.5–5.1)
SODIUM: 138 mmol/L (ref 135–145)
Total Bilirubin: 1.7 mg/dL — ABNORMAL HIGH (ref 0.3–1.2)
Total Protein: 7.3 g/dL (ref 6.5–8.1)

## 2018-08-04 LAB — CBC
HCT: 44.4 % (ref 39.0–52.0)
Hemoglobin: 14.8 g/dL (ref 13.0–17.0)
MCH: 29.8 pg (ref 26.0–34.0)
MCHC: 33.3 g/dL (ref 30.0–36.0)
MCV: 89.5 fL (ref 80.0–100.0)
NRBC: 0 % (ref 0.0–0.2)
Platelets: 183 10*3/uL (ref 150–400)
RBC: 4.96 MIL/uL (ref 4.22–5.81)
RDW: 12 % (ref 11.5–15.5)
WBC: 8.7 10*3/uL (ref 4.0–10.5)

## 2018-08-04 LAB — LIPASE, BLOOD: Lipase: 51 U/L (ref 11–51)

## 2018-08-04 NOTE — ED Notes (Signed)
Pt. States hes leaving due to wait time. Pulling OTF.

## 2019-02-21 ENCOUNTER — Other Ambulatory Visit: Payer: Self-pay

## 2019-02-21 DIAGNOSIS — Z20822 Contact with and (suspected) exposure to covid-19: Secondary | ICD-10-CM

## 2019-02-22 LAB — NOVEL CORONAVIRUS, NAA: SARS-CoV-2, NAA: NOT DETECTED

## 2019-05-30 ENCOUNTER — Other Ambulatory Visit: Payer: Self-pay

## 2019-05-30 ENCOUNTER — Ambulatory Visit: Payer: 59 | Attending: Internal Medicine

## 2019-05-30 DIAGNOSIS — Z20822 Contact with and (suspected) exposure to covid-19: Secondary | ICD-10-CM

## 2019-05-31 ENCOUNTER — Encounter (HOSPITAL_COMMUNITY): Payer: Self-pay | Admitting: *Deleted

## 2019-05-31 ENCOUNTER — Emergency Department (HOSPITAL_COMMUNITY)
Admission: EM | Admit: 2019-05-31 | Discharge: 2019-05-31 | Disposition: A | Discharge status: Home / Self Care | Payer: 59 | Attending: Emergency Medicine | Admitting: Emergency Medicine

## 2019-05-31 ENCOUNTER — Other Ambulatory Visit: Payer: Self-pay

## 2019-05-31 DIAGNOSIS — Z20822 Contact with and (suspected) exposure to covid-19: Secondary | ICD-10-CM | POA: Diagnosis not present

## 2019-05-31 DIAGNOSIS — J988 Other specified respiratory disorders: Secondary | ICD-10-CM | POA: Diagnosis not present

## 2019-05-31 DIAGNOSIS — R05 Cough: Secondary | ICD-10-CM | POA: Diagnosis present

## 2019-05-31 DIAGNOSIS — B9789 Other viral agents as the cause of diseases classified elsewhere: Secondary | ICD-10-CM | POA: Diagnosis not present

## 2019-05-31 DIAGNOSIS — Z79899 Other long term (current) drug therapy: Secondary | ICD-10-CM | POA: Diagnosis not present

## 2019-05-31 DIAGNOSIS — R030 Elevated blood-pressure reading, without diagnosis of hypertension: Secondary | ICD-10-CM | POA: Diagnosis not present

## 2019-05-31 LAB — POC SARS CORONAVIRUS 2 AG -  ED: SARS Coronavirus 2 Ag: NEGATIVE

## 2019-05-31 NOTE — ED Triage Notes (Signed)
Pt reports covid symptoms x 1 week, had exposure from girlfriend. No distress noted.

## 2019-05-31 NOTE — Discharge Instructions (Signed)
Your symptoms are consistent with a viral respiratory illness and are very likely due to Covid 19 given your close household contact with a known positive patient.  The results of your PCR test for Covid are not yet back.  You should self isolate at home for a full 10 days from symptom onset on January 1st AND until fever free for at least 24 hours.  May take ibuprofen as needed for body aches, sore throat, fever; 600 mg every 6hr as needed. Rest and drink plenty of fluids. May take honey 2 tsp three times daily by itself or mixed in warm water for honey tea.  Return for shortness of breath, chest pain, passing out spells, bright red eyes, new full body rash, swelling/peeling of fingers or toes, new concerns.

## 2019-05-31 NOTE — ED Provider Notes (Signed)
[facility] EMERGENCY DEPARTMENT Provider Note   CSN: [id number] Arrival date & time: 05/31/19  0546     History Chief Complaint  Patient presents with  . Cough    Gerald Anderson is a 29 y.o. male.  29 year old male with remote history of Kawasaki disease at age 44 in 60, otherwise healthy, presents with concern for COVID-19 infection.  Patient's girlfriend, with whom he lives, tested positive for COVID-19 last week.  She had an initial COVID-19 antigen which was negative but then her PCR test returned positive.  She has now recovered and no longer having symptoms.  Gerald Anderson developed fatigue approximately 1 week ago.  4 days ago he developed cough nasal congestion and low-grade fever.  Temperature never went above 100.  He is no longer having low-grade fevers but still has body aches and fatigue as well as decreased appetite.  No vomiting or diarrhea.  Patient and his girlfriend have a 57-month-old daughter together.  She also has cough and congestion.  She is not in daycare.  He reports she is doing well no fevers.  Patient reports he lived in [place] when he was diagnosed with Kawasaki disease at age 72.  He does not recall the details but believes he saw a cardiologist and took aspirin.  He does not believe he had any coronary artery aneurysms.  He has not seen a cardiologist in years and does not take any cardiac medications.  He had heard that COVID-19 could cause Kawasaki type symptoms and wanted to get evaluated today by a physician.  He was seen yesterday for a lab test for Covid at [facility] but did not see a physician.  He is still awaiting the results of his Covid PCR from yesterday.  The history is provided by the patient.       Past Medical History:  Diagnosis Date  . Back pain   . Kawasaki disease [facility])     Patient Active Problem List   Diagnosis Date Noted  . Acute gangrenous tonsillitis 11/13/2014    History reviewed. No pertinent  surgical history.     History reviewed. No pertinent family history.  Social History   Tobacco Use  . Smoking status: Never Smoker  . Smokeless tobacco: Never Used  Substance Use Topics  . Alcohol use: Yes    Comment: socially  . Drug use: No    Home Medications Prior to Admission medications   Medication Sig Start Date End Date Taking? Authorizing Provider  acetaminophen (TYLENOL) 500 MG tablet Take 1,000 mg by mouth every 6 (six) hours as needed for mild pain (back pain).    [provider]  cephALEXin (KEFLEX) 500 MG capsule Take 1 capsule (500 mg total) by mouth 3 (three) times daily. Patient not taking: Reported on 03/20/2018 01/18/17   Azalia Bilis, MD  HYDROcodone-acetaminophen (NORCO/VICODIN) 5-325 MG tablet Take 1 tablet by mouth every 6 (six) hours as needed for moderate pain. Patient not taking: Reported on 03/20/2018 01/18/17   Azalia Bilis, MD  ibuprofen (ADVIL,MOTRIN) 200 MG tablet Take 400 mg by mouth every 6 (six) hours as needed for moderate pain.    [provider]  ibuprofen (ADVIL,MOTRIN) 600 MG tablet Take 1 tablet (600 mg total) by mouth every 8 (eight) hours as needed. Patient not taking: Reported on 03/20/2018 01/18/17   Azalia Bilis, MD  Menthol, Topical Analgesic, (ICY HOT BACK EX) Apply 1 application topically daily as needed (Back Pain).    [provider]  ondansetron (ZOFRAN ODT) 4 MG disintegrating tablet Take 1 tablet (4 mg total) by mouth every 4 (four) hours as needed for nausea or vomiting. 03/20/18   Arby Barrette, MD  pantoprazole (PROTONIX) 20 MG tablet Take 1 tablet (20 mg total) by mouth daily. 03/20/18   Arby Barrette, MD  traMADol (ULTRAM) 50 MG tablet Take 1 tablet (50 mg total) by mouth every 6 (six) hours as needed. Patient not taking: Reported on 03/20/2018 01/25/17   Janne Napoleon, NP    Allergies    Patient has no known allergies.  Review of Systems   Review of Systems  All systems reviewed and were  reviewed and were negative except as stated in the HPI  Physical Exam Updated Vital Signs BP (!) 149/94 (BP Location: Right Arm)   Pulse 81   Temp 98.1 F (36.7 C) (Oral)   Resp 16   SpO2 100%   Physical Exam Vitals and nursing note reviewed.  Constitutional:      Anderson: He is not in acute distress.    Appearance: Normal appearance. He is well-developed.     Comments: Well-appearing, sitting up in bed texting on his cell phone, no distress, normal speech  HENT:     Head: Normocephalic and atraumatic.     Right Ear: Tympanic membrane normal.     Left Ear: Tympanic membrane normal.     Nose: Nose normal. No congestion or rhinorrhea.     Mouth/Throat:     Mouth: Mucous membranes are moist.     Pharynx: No oropharyngeal exudate or posterior oropharyngeal erythema.  Eyes:     Conjunctiva/sclera: Conjunctivae normal.     Pupils: Pupils are equal, round, and reactive to light.     Comments: No conjunctival redness  Neck:     Comments: No cervical lymphadenopathy Cardiovascular:     Rate and Rhythm: Normal rate and regular rhythm.     Pulses: Normal pulses.     Heart sounds: Normal heart sounds. No murmur. No friction rub. No gallop.   Pulmonary:     Effort: Pulmonary effort is normal. No respiratory distress.     Breath sounds: Normal breath sounds. No wheezing or rales.  Abdominal:     Anderson: Bowel sounds are normal.     Palpations: Abdomen is soft.     Tenderness: There is no abdominal tenderness. There is no guarding or rebound.  Musculoskeletal:     Cervical back: Normal range of motion and neck supple.     Comments: Fingers normal, no swelling or peeling  Skin:    Anderson: Skin is warm and dry.     Capillary Refill: Capillary refill takes less than 2 seconds.     Findings: No rash.  Neurological:     Anderson: No focal deficit present.     Mental Status: He is alert and oriented to person, place, and time.     Cranial Nerves: No cranial nerve deficit.      Comments: Normal strength 5/5 in upper and lower extremities     ED Results / Procedures / Treatments   Labs (all labs ordered are listed, but only abnormal results are displayed) Labs Reviewed  POC SARS CORONAVIRUS 2 AG -  ED    EKG None  Radiology No results found.  Procedures Procedures (including critical care time)  Medications Ordered in ED Medications - No data to display  ED Course  I have reviewed the triage vital signs and the nursing notes.  Pertinent labs & imaging results that were available during my care of the patient were reviewed by me and considered in my medical decision making (see chart for details).    MDM Rules/Calculators/A&P                      29 year old male with remote history of Kawasaki disease at age 5years, otherwise healthy, presents with concerns for COVID-19 infection.  See detailed history above.  On exam here afebrile with normal vitals except for mildly elevated blood pressure for age.  He is very well-appearing, awake alert with normal mental status.  Appears well-hydrated.  Normal speech and no distress.  TMs clear, throat benign, lungs clear with symmetric breath sounds and normal work of breathing with oxygen saturations 100% on room air.  Abdomen benign.  He has no features of MIS C, specifically no conjunctival redness, cervical adenopathy, rash, or swelling/peeling of fingers.  Discussed this with patient at length, most cases of MIS C have been in children and adolescents but discussed signs and symptoms which should prompt return.  Rapid COVID-19 antigen obtained in the adult triage area was negative but explained that he is at high risk for active COVID-19 infection given his close household exposure with his girlfriend who is known to be Covid positive and the fact he is symptomatic.  Therefore, regardless of this result recommended a full 10-day quarantine from symptom onset.  Work note provided.  Supportive care measures  for COVID-19 infection discussed, honey for cough, ibuprofen as needed for body aches and fever.  Advised return for any chest pain shortness of breath worsening condition or new concerns.  Gerald Anderson was evaluated in Emergency Department on 05/31/2019 for the symptoms described in the history of present illness. He was evaluated in the context of the global COVID-19 pandemic, which necessitated consideration that the patient might be at risk for infection with the SARS-CoV-2 virus that causes COVID-19. Institutional protocols and algorithms that pertain to the evaluation of patients at risk for COVID-19 are in a state of rapid change based on information released by regulatory bodies including the CDC and federal and state organizations. These policies and algorithms were followed during the patient's care in the ED.  Final Clinical Impression(s) / ED Diagnoses Final diagnoses:  Viral respiratory illness  Close exposure to COVID-19 virus    Rx / DC Orders ED Discharge Orders    None       Harlene Salts, MD 05/31/19 1024

## 2019-06-01 LAB — NOVEL CORONAVIRUS, NAA: SARS-CoV-2, NAA: NOT DETECTED

## 2019-08-25 ENCOUNTER — Ambulatory Visit: Payer: 59

## 2019-08-26 ENCOUNTER — Ambulatory Visit: Payer: 59

## 2020-04-24 ENCOUNTER — Other Ambulatory Visit: Payer: Self-pay

## 2020-08-29 ENCOUNTER — Other Ambulatory Visit: Payer: Self-pay

## 2020-08-29 ENCOUNTER — Inpatient Hospital Stay (HOSPITAL_BASED_OUTPATIENT_CLINIC_OR_DEPARTMENT_OTHER)
Admission: EM | Admit: 2020-08-29 | Discharge: 2020-08-30 | DRG: 101 | Disposition: A | Discharge status: Home / Self Care | Payer: No Typology Code available for payment source | Attending: Internal Medicine | Admitting: Internal Medicine

## 2020-08-29 ENCOUNTER — Emergency Department (HOSPITAL_BASED_OUTPATIENT_CLINIC_OR_DEPARTMENT_OTHER): Payer: No Typology Code available for payment source

## 2020-08-29 ENCOUNTER — Encounter (HOSPITAL_BASED_OUTPATIENT_CLINIC_OR_DEPARTMENT_OTHER): Payer: Self-pay | Admitting: Obstetrics and Gynecology

## 2020-08-29 DIAGNOSIS — G40409 Other generalized epilepsy and epileptic syndromes, not intractable, without status epilepticus: Secondary | ICD-10-CM | POA: Diagnosis present

## 2020-08-29 DIAGNOSIS — F1290 Cannabis use, unspecified, uncomplicated: Secondary | ICD-10-CM | POA: Diagnosis present

## 2020-08-29 DIAGNOSIS — E870 Hyperosmolality and hypernatremia: Secondary | ICD-10-CM

## 2020-08-29 DIAGNOSIS — Z20822 Contact with and (suspected) exposure to covid-19: Secondary | ICD-10-CM | POA: Diagnosis present

## 2020-08-29 DIAGNOSIS — R4182 Altered mental status, unspecified: Secondary | ICD-10-CM

## 2020-08-29 DIAGNOSIS — I10 Essential (primary) hypertension: Secondary | ICD-10-CM

## 2020-08-29 DIAGNOSIS — R569 Unspecified convulsions: Secondary | ICD-10-CM

## 2020-08-29 DIAGNOSIS — E876 Hypokalemia: Secondary | ICD-10-CM | POA: Diagnosis present

## 2020-08-29 DIAGNOSIS — E872 Acidosis, unspecified: Secondary | ICD-10-CM

## 2020-08-29 DIAGNOSIS — Z79899 Other long term (current) drug therapy: Secondary | ICD-10-CM | POA: Diagnosis not present

## 2020-08-29 DIAGNOSIS — M6282 Rhabdomyolysis: Secondary | ICD-10-CM | POA: Diagnosis present

## 2020-08-29 DIAGNOSIS — E860 Dehydration: Secondary | ICD-10-CM | POA: Diagnosis present

## 2020-08-29 DIAGNOSIS — I160 Hypertensive urgency: Secondary | ICD-10-CM | POA: Diagnosis present

## 2020-08-29 DIAGNOSIS — E871 Hypo-osmolality and hyponatremia: Secondary | ICD-10-CM | POA: Diagnosis present

## 2020-08-29 DIAGNOSIS — R413 Other amnesia: Secondary | ICD-10-CM | POA: Diagnosis present

## 2020-08-29 DIAGNOSIS — G40909 Epilepsy, unspecified, not intractable, without status epilepticus: Secondary | ICD-10-CM | POA: Diagnosis not present

## 2020-08-29 LAB — BASIC METABOLIC PANEL
Anion gap: 29 — ABNORMAL HIGH (ref 5–15)
BUN: 13 mg/dL (ref 6–20)
CO2: 17 mmol/L — ABNORMAL LOW (ref 22–32)
Calcium: 11.1 mg/dL — ABNORMAL HIGH (ref 8.9–10.3)
Chloride: 102 mmol/L (ref 98–111)
Creatinine, Ser: 1.06 mg/dL (ref 0.61–1.24)
GFR, Estimated: >60 mL/min (ref >60)
Glucose, Bld: 127 mg/dL — ABNORMAL HIGH (ref 70–99)
Potassium: 4.5 mmol/L (ref 3.5–5.1)
Sodium: 148 mmol/L — ABNORMAL HIGH (ref 135–145)

## 2020-08-29 LAB — CBC WITH DIFFERENTIAL/PLATELET
Abs Immature Granulocytes: 0.04 10*3/uL (ref 0.00–0.07)
Basophils Absolute: 0 10*3/uL (ref 0.0–0.1)
Basophils Relative: 1 %
Eosinophils Absolute: 0 10*3/uL (ref 0.0–0.5)
Eosinophils Relative: 0 %
HCT: 49.7 % (ref 39.0–52.0)
Hemoglobin: 15.4 g/dL (ref 13.0–17.0)
Immature Granulocytes: 1 %
Lymphocytes Relative: 26 %
Lymphs Abs: 1.8 10*3/uL (ref 0.7–4.0)
MCH: 30.9 pg (ref 26.0–34.0)
MCHC: 31 g/dL (ref 30.0–36.0)
MCV: 99.8 fL (ref 80.0–100.0)
Monocytes Absolute: 0.8 10*3/uL (ref 0.1–1.0)
Monocytes Relative: 10 %
Neutro Abs: 4.5 10*3/uL (ref 1.7–7.7)
Neutrophils Relative %: 62 %
Platelets: 173 10*3/uL (ref 150–400)
RBC: 4.98 MIL/uL (ref 4.22–5.81)
RDW: 13.2 % (ref 11.5–15.5)
WBC: 7.2 10*3/uL (ref 4.0–10.5)
nRBC: 0 % (ref 0.0–0.2)

## 2020-08-29 LAB — CK: Total CK: 242 U/L (ref 49–397)

## 2020-08-29 LAB — CBG MONITORING, ED: Glucose-Capillary: 98 mg/dL (ref 70–99)

## 2020-08-29 MED ORDER — LORAZEPAM 2 MG/ML IJ SOLN
INTRAMUSCULAR | Status: AC
  Administered 2020-08-29: 2 mg via INTRAVENOUS
  Filled 2020-08-29: qty 1

## 2020-08-29 MED ORDER — SODIUM CHLORIDE 0.9 % IV BOLUS
1000.0000 mL | Freq: Once | INTRAVENOUS | Status: AC
  Administered 2020-08-29: 1000 mL via INTRAVENOUS

## 2020-08-29 MED ORDER — AMLODIPINE BESYLATE 5 MG PO TABS: 10.0000 mg | ORAL_TABLET | Freq: Once | ORAL | Status: DC

## 2020-08-29 MED ORDER — LEVETIRACETAM IN NACL 1500 MG/100ML IV SOLN
1500.0000 mg | Freq: Once | INTRAVENOUS | Status: DC
  Filled 2020-08-29: qty 100

## 2020-08-29 MED ORDER — THIAMINE HCL 100 MG/ML IJ SOLN
500.0000 mg | Freq: Once | INTRAVENOUS | Status: AC
  Administered 2020-08-29: 500 mg via INTRAVENOUS
  Filled 2020-08-29: qty 5

## 2020-08-29 MED ORDER — LORAZEPAM 2 MG/ML IJ SOLN: 2.0000 mg | Freq: Once | INTRAMUSCULAR | Status: AC

## 2020-08-29 MED ORDER — LABETALOL HCL 5 MG/ML IV SOLN
20.0000 mg | Freq: Once | INTRAVENOUS | Status: AC
  Administered 2020-08-29: 20 mg via INTRAVENOUS
  Filled 2020-08-29: qty 4

## 2020-08-29 MED ORDER — LEVETIRACETAM IN NACL 500 MG/100ML IV SOLN
INTRAVENOUS | Status: AC
  Administered 2020-08-29: 500 mg via INTRAVENOUS
  Filled 2020-08-29: qty 100

## 2020-08-29 MED ORDER — LEVETIRACETAM IN NACL 1000 MG/100ML IV SOLN
INTRAVENOUS | Status: AC
  Administered 2020-08-29: 1000 mg via INTRAVENOUS
  Filled 2020-08-29: qty 100

## 2020-08-29 NOTE — ED Notes (Signed)
Called [facility] to transport patient to [facility] [place] room 15

## 2020-08-29 NOTE — ED Notes (Signed)
Report given to Zach with [facility] °

## 2020-08-29 NOTE — ED Notes (Signed)
Report called to [facility] on [facility] at [facility]

## 2020-08-29 NOTE — ED Notes (Signed)
ED Provider at bedside. 

## 2020-08-29 NOTE — ED Provider Notes (Signed)
[facility] EMERGENCY DEPT Provider Note   CSN: [id number] Arrival date & time: 08/29/20  1845     History Chief Complaint  Patient presents with  . Memory Loss    Byrne Capek is a 30 y.o. male.  Patient presented to ER chief complaint of memory issues.  Patient states that he had difficulty concentrating and difficulty remembering things.  He had to concentrate to try to remember his child's birthday, he had to concentrate to recall how long he been doing the work that he does or when he moved to [place] from [place].  Patient otherwise states he is under a lot of emotional/mental stress with his child's mother having moved to [place] and being separated from his family.  Otherwise he denies any pain or discomfort other than feeling a fogginess in the brain.  Denies fevers vomiting cough or diarrhea.  Denies headache or chest pain abdominal pain.        Past Medical History:  Diagnosis Date  . Back pain   . Kawasaki disease [facility])     Patient Active Problem List   Diagnosis Date Noted  . Acute gangrenous tonsillitis 11/13/2014    History reviewed. No pertinent surgical history.     No family history on file.  Social History   Tobacco Use  . Smoking status: Never Smoker  . Smokeless tobacco: Never Used  Substance Use Topics  . Alcohol use: Yes    Comment: socially  . Drug use: No    Home Medications Prior to Admission medications   Medication Sig Start Date End Date Taking? Authorizing Provider  acetaminophen (TYLENOL) 500 MG tablet Take 1,000 mg by mouth every 6 (six) hours as needed for mild pain (back pain).    [provider]  cephALEXin (KEFLEX) 500 MG capsule Take 1 capsule (500 mg total) by mouth 3 (three) times daily. Patient not taking: Reported on 03/20/2018 01/18/17   Azalia Bilis, MD  HYDROcodone-acetaminophen (NORCO/VICODIN) 5-325 MG tablet Take 1 tablet by mouth every 6 (six) hours as needed for moderate pain. Patient  not taking: Reported on 03/20/2018 01/18/17   Azalia Bilis, MD  ibuprofen (ADVIL,MOTRIN) 200 MG tablet Take 400 mg by mouth every 6 (six) hours as needed for moderate pain.    [provider]  ibuprofen (ADVIL,MOTRIN) 600 MG tablet Take 1 tablet (600 mg total) by mouth every 8 (eight) hours as needed. Patient not taking: Reported on 03/20/2018 01/18/17   Azalia Bilis, MD  Menthol, Topical Analgesic, (ICY HOT BACK EX) Apply 1 application topically daily as needed (Back Pain).    [provider]  ondansetron (ZOFRAN ODT) 4 MG disintegrating tablet Take 1 tablet (4 mg total) by mouth every 4 (four) hours as needed for nausea or vomiting. 03/20/18   Arby Barrette, MD  pantoprazole (PROTONIX) 20 MG tablet Take 1 tablet (20 mg total) by mouth daily. 03/20/18   Arby Barrette, MD  traMADol (ULTRAM) 50 MG tablet Take 1 tablet (50 mg total) by mouth every 6 (six) hours as needed. Patient not taking: Reported on 03/20/2018 01/25/17   Janne Napoleon, NP    Allergies    Patient has no known allergies.  Review of Systems   Review of Systems  Constitutional: Negative for fever.  HENT: Negative for ear pain and sore throat.   Eyes: Negative for pain.  Respiratory: Negative for cough.   Cardiovascular: Negative for chest pain.  Gastrointestinal: Negative for abdominal pain.  Genitourinary: Negative for flank pain.  Musculoskeletal: Negative for back pain.  Skin: Negative for color change and rash.  Neurological: Negative for syncope.  All other systems reviewed and are negative.   Physical Exam Updated Vital Signs BP 120/70   Pulse 85   Temp 98.7 F (37.1 C) (Oral)   Resp 18   Ht 6\' 1"  (1.854 m)   Wt 75 kg   SpO2 98%   BMI 21.81 kg/m   Physical Exam Constitutional:      General: He is not in acute distress.    Appearance: He is well-developed.  HENT:     Head: Normocephalic.     Nose: Nose normal.  Eyes:     Extraocular Movements: Extraocular movements intact.   Cardiovascular:     Rate and Rhythm: Normal rate.  Pulmonary:     Effort: Pulmonary effort is normal.  Skin:    Coloration: Skin is not jaundiced.  Neurological:     General: No focal deficit present.     Mental Status: He is alert and oriented to person, place, and time.     Cranial Nerves: No cranial nerve deficit.     Motor: No weakness.     Comments: Patient has intact extraocular motion pupils equal reactive bilaterally to light.  Strength is 5/5 all extremities and gait is normal without assistance.  He is awake and alert and oriented to time person and place.  However on deeper conversation he has to pause and think for several seconds when asked questions such as how long he has been doing his job or when he moves from or what his child's birthday is.       ED Results / Procedures / Treatments   Labs (all labs ordered are listed, but only abnormal results are displayed) Labs Reviewed  BASIC METABOLIC PANEL - Abnormal; Notable for the following components:      Result Value   Sodium 148 (*)    CO2 17 (*)    Glucose, Bld 127 (*)    Calcium 11.1 (*)    Anion gap 29 (*)    All other components within normal limits  RESP PANEL BY RT-PCR (FLU A&B, COVID) ARPGX2  CBC WITH DIFFERENTIAL/PLATELET  CBG MONITORING, ED    EKG EKG Interpretation  Date/Time:  Wednesday August 29 2020 19:34:37 EDT Ventricular Rate:  101 PR Interval:  150 QRS Duration: 117 QT Interval:  347 QTC Calculation: 450 R Axis:   79 Text Interpretation: Sinus tachycardia LAE, consider biatrial enlargement Nonspecific intraventricular conduction delay Confirmed by 07-31-2002 ([id number]) on 08/29/2020 9:02:08 PM   Radiology CT Head Wo Contrast  Result Date: 08/29/2020 CLINICAL DATA:  Mental status change, unknown cause. Memory loss. Seizure. EXAM: CT HEAD WITHOUT CONTRAST TECHNIQUE: Contiguous axial images were obtained from the base of the skull through the vertex without intravenous contrast.  COMPARISON:  01/17/2017 FINDINGS: Brain: There is no evidence of an acute infarct, intracranial hemorrhage, mass, midline shift, or extra-axial fluid collection. The ventricles and sulci are normal. Vascular: No hyperdense vessel. Skull: No fracture or suspicious osseous lesion. Sinuses/Orbits: Mild bilateral ethmoid and left maxillary sinus mucosal thickening. Clear visualized mastoid air cells. Unremarkable orbits. Other: None. IMPRESSION: Unremarkable CT appearance of the brain. Electronically Signed   By: 01/19/2017 M.D.   On: 08/29/2020 20:33    Procedures .Critical Care E&M Performed by: 10/29/2020, MD  Critical care provider statement:    Critical care time (minutes):  30   Critical care time was  exclusive of:  Separately billable procedures and treating other patients and teaching time   Critical care was necessary to treat or prevent imminent or life-threatening deterioration of the following conditions:  CNS failure or compromise After initial E/M assessment, critical care services were subsequently performed that were exclusive of separately billable procedures or treatment.       Medications Ordered in ED Medications  levETIRAcetam (KEPPRA) IVPB 1500 mg/ 100 mL premix (1,500 mg Intravenous Not Given 08/29/20 1950)  LORazepam (ATIVAN) injection 2 mg (2 mg Intravenous Given 08/29/20 1925)  labetalol (NORMODYNE) injection 20 mg (20 mg Intravenous Given 08/29/20 1932)  levETIRAcetam (KEPPRA) 1000 MG/100ML IVPB (  Stopped 08/29/20 2005)  levETIRAcetam (KEPRRA) 500 MG/100ML IVPB SOLN (  Stopped 08/29/20 2006)    ED Course  I have reviewed the triage vital signs and the nursing notes.  Pertinent labs & imaging results that were available during my care of the patient were reviewed by me and considered in my medical decision making (see chart for details).    MDM Rules/Calculators/A&P                          A few minutes after had evaluate the patient, I was called immediately  back to his room by nursing staff reporting that he appeared to be having a seizure.  Upon my arrival he had a tonic-clonic episode and was drooling unresponsive.  Placed on facial nonrebreather, 2 mg Ativan provided with subsequent resolution of seizure activity.  Patient loaded with IV Keppra, CT of the brain ordered.  He remained hypertensive in the blood pressure about 197 systolic.  Given IV labetalol 20 mg.  Blood pressure appears improved at this time.  Patient is very sleepy appearing now but rousable, answers questions appropriately and then falls asleep.  Otherwise he is neuro intact moving all extremities.  Case discussed with neurology, recommending medical admission and MRI of the brain and further work-up.   Final Clinical Impression(s) / ED Diagnoses Final diagnoses:  New onset seizure (HCC)  Severe hypertension    Rx / DC Orders ED Discharge Orders    None       Cheryll Cockayne, MD 08/29/20 2125

## 2020-08-29 NOTE — ED Notes (Signed)
This nurse enters room to introduce herself to patient. Patient has blank stare, is mumbling without making in sense.This nurse call charge nurse to room. Patient begins to have a seizure lasting approximately one minute. Doctor comes to bedside, O2 placed NRB at 15L, 20g placed in left forearm, ativan 2mg  iv, 20mg  of labetolol iv.

## 2020-08-29 NOTE — ED Triage Notes (Signed)
Patient reports to the ER for memory loss. Patient reports he cannot remember simple things. Patient reports significant family/home stress. Patient endorses headaches. Patient reports he cannot remember his childs age or birthday at this time. Patient reports feeling diaphoretic and anxious. Patient reports feeling scared.

## 2020-08-29 NOTE — ED Notes (Signed)
Patient verbally consented to transfer but unable to sign at this time. This nurse signed transfer consent

## 2020-08-29 NOTE — ED Notes (Signed)
This RN was brought into the Room due to Primary RN concerns regarding patients condition. Upon entering the room, the patient was obviously disoriented in the stretcher. Shortly, the patient had a seizure lasting approximately 60 seconds. EDP Hong made aware and is to place orders for medications for this RN to facilitate. PIV placed, Airway intact , NRB 15L placed, Verbal Order for 2 mg of Ativan IV to be given.

## 2020-08-30 ENCOUNTER — Inpatient Hospital Stay (HOSPITAL_COMMUNITY): Payer: No Typology Code available for payment source

## 2020-08-30 ENCOUNTER — Encounter (HOSPITAL_COMMUNITY): Payer: Self-pay | Admitting: Family Medicine

## 2020-08-30 DIAGNOSIS — E872 Acidosis, unspecified: Secondary | ICD-10-CM

## 2020-08-30 DIAGNOSIS — I160 Hypertensive urgency: Secondary | ICD-10-CM

## 2020-08-30 DIAGNOSIS — E870 Hyperosmolality and hypernatremia: Secondary | ICD-10-CM

## 2020-08-30 DIAGNOSIS — R569 Unspecified convulsions: Secondary | ICD-10-CM

## 2020-08-30 LAB — RESP PANEL BY RT-PCR (FLU A&B, COVID) ARPGX2
Influenza A by PCR: NEGATIVE
Influenza B by PCR: NEGATIVE
SARS Coronavirus 2 by RT PCR: NEGATIVE

## 2020-08-30 LAB — CBC WITH DIFFERENTIAL/PLATELET
Abs Immature Granulocytes: 0.02 10*3/uL (ref 0.00–0.07)
Basophils Absolute: 0 10*3/uL (ref 0.0–0.1)
Basophils Relative: 0 %
Eosinophils Absolute: 0 10*3/uL (ref 0.0–0.5)
Eosinophils Relative: 0 %
HCT: 38.2 % — ABNORMAL LOW (ref 39.0–52.0)
Hemoglobin: 13 g/dL (ref 13.0–17.0)
Immature Granulocytes: 0 %
Lymphocytes Relative: 14 %
Lymphs Abs: 1 10*3/uL (ref 0.7–4.0)
MCH: 32 pg (ref 26.0–34.0)
MCHC: 34 g/dL (ref 30.0–36.0)
MCV: 94.1 fL (ref 80.0–100.0)
Monocytes Absolute: 0.8 10*3/uL (ref 0.1–1.0)
Monocytes Relative: 11 %
Neutro Abs: 5.7 10*3/uL (ref 1.7–7.7)
Neutrophils Relative %: 75 %
Platelets: 141 10*3/uL — ABNORMAL LOW (ref 150–400)
RBC: 4.06 MIL/uL — ABNORMAL LOW (ref 4.22–5.81)
RDW: 12.8 % (ref 11.5–15.5)
WBC: 7.7 10*3/uL (ref 4.0–10.5)
nRBC: 0 % (ref 0.0–0.2)

## 2020-08-30 LAB — COMPREHENSIVE METABOLIC PANEL
ALT: 34 U/L (ref 0–44)
AST: 60 U/L — ABNORMAL HIGH (ref 15–41)
Albumin: 3.8 g/dL (ref 3.5–5.0)
Alkaline Phosphatase: 38 U/L (ref 38–126)
Anion gap: 9 (ref 5–15)
BUN: 10 mg/dL (ref 6–20)
CO2: 25 mmol/L (ref 22–32)
Calcium: 8.4 mg/dL — ABNORMAL LOW (ref 8.9–10.3)
Chloride: 102 mmol/L (ref 98–111)
Creatinine, Ser: 0.88 mg/dL (ref 0.61–1.24)
GFR, Estimated: >60 mL/min (ref >60)
Glucose, Bld: 81 mg/dL (ref 70–99)
Potassium: 3.3 mmol/L — ABNORMAL LOW (ref 3.5–5.1)
Sodium: 136 mmol/L (ref 135–145)
Total Bilirubin: 1 mg/dL (ref 0.3–1.2)
Total Protein: 6.4 g/dL — ABNORMAL LOW (ref 6.5–8.1)

## 2020-08-30 LAB — CBC
HCT: 38.6 % — ABNORMAL LOW (ref 39.0–52.0)
Hemoglobin: 12.7 g/dL — ABNORMAL LOW (ref 13.0–17.0)
MCH: 31.1 pg (ref 26.0–34.0)
MCHC: 32.9 g/dL (ref 30.0–36.0)
MCV: 94.4 fL (ref 80.0–100.0)
Platelets: 139 10*3/uL — ABNORMAL LOW (ref 150–400)
RBC: 4.09 MIL/uL — ABNORMAL LOW (ref 4.22–5.81)
RDW: 12.9 % (ref 11.5–15.5)
WBC: 6.4 10*3/uL (ref 4.0–10.5)
nRBC: 0 % (ref 0.0–0.2)

## 2020-08-30 LAB — VITAMIN B12: Vitamin B-12: 248 pg/mL (ref 180–914)

## 2020-08-30 LAB — SALICYLATE LEVEL: Salicylate Lvl: <7 mg/dL — ABNORMAL LOW (ref 7.0–30.0)

## 2020-08-30 LAB — HIV ANTIBODY (ROUTINE TESTING W REFLEX): HIV Screen 4th Generation wRfx: NONREACTIVE

## 2020-08-30 LAB — LACTIC ACID, PLASMA: Lactic Acid, Venous: 1.2 mmol/L (ref 0.5–1.9)

## 2020-08-30 LAB — RPR: RPR Ser Ql: NONREACTIVE

## 2020-08-30 LAB — ETHANOL: Alcohol, Ethyl (B): <10 mg/dL (ref <10)

## 2020-08-30 LAB — CK: Total CK: 1196 U/L — ABNORMAL HIGH (ref 49–397)

## 2020-08-30 MED ORDER — ENOXAPARIN SODIUM 40 MG/0.4ML ~~LOC~~ SOLN
40.0000 mg | Freq: Every day | SUBCUTANEOUS | Status: DC
  Administered 2020-08-30: 40 mg via SUBCUTANEOUS
  Filled 2020-08-30: qty 0.4

## 2020-08-30 MED ORDER — THIAMINE HCL 100 MG/ML IJ SOLN
500.0000 mg | Freq: Three times a day (TID) | INTRAVENOUS | Status: DC
  Administered 2020-08-30: 500 mg via INTRAVENOUS
  Filled 2020-08-30 (×5): qty 5

## 2020-08-30 MED ORDER — AMLODIPINE BESYLATE 5 MG PO TABS
5.0000 mg | ORAL_TABLET | Freq: Every day | ORAL | Status: DC
  Administered 2020-08-30: 5 mg via ORAL
  Filled 2020-08-30: qty 1

## 2020-08-30 MED ORDER — LORAZEPAM 2 MG/ML IJ SOLN: 1.0000 mg | INTRAMUSCULAR | Status: DC | PRN

## 2020-08-30 MED ORDER — FOLIC ACID 1 MG PO TABS
1.0000 mg | ORAL_TABLET | Freq: Every day | ORAL | Status: DC
  Administered 2020-08-30: 1 mg via ORAL
  Filled 2020-08-30: qty 1

## 2020-08-30 MED ORDER — LEVETIRACETAM 500 MG PO TABS: 500.0000 mg | ORAL_TABLET | Freq: Two times a day (BID) | ORAL | Status: DC

## 2020-08-30 MED ORDER — POTASSIUM CHLORIDE CRYS ER 20 MEQ PO TBCR
40.0000 meq | EXTENDED_RELEASE_TABLET | Freq: Once | ORAL | Status: AC
  Administered 2020-08-30: 40 meq via ORAL
  Filled 2020-08-30: qty 2

## 2020-08-30 MED ORDER — VITAMIN B-12 1000 MCG PO TABS
1000.0000 ug | ORAL_TABLET | Freq: Every day | ORAL | Status: DC
  Administered 2020-08-30: 1000 ug via ORAL
  Filled 2020-08-30: qty 1

## 2020-08-30 MED ORDER — CYANOCOBALAMIN 1000 MCG PO TABS: 1000.0000 ug | ORAL_TABLET | Freq: Every day | ORAL | 1 refills | Status: DC

## 2020-08-30 MED ORDER — AMLODIPINE BESYLATE 5 MG PO TABS: 5.0000 mg | ORAL_TABLET | Freq: Every day | ORAL | 1 refills | Status: DC

## 2020-08-30 MED ORDER — ADULT MULTIVITAMIN W/MINERALS CH: 1.0000 | ORAL_TABLET | Freq: Every day | ORAL | Status: DC

## 2020-08-30 MED ORDER — FOLIC ACID 1 MG PO TABS: 1.0000 mg | ORAL_TABLET | Freq: Every day | ORAL | 1 refills | Status: DC

## 2020-08-30 MED ORDER — ADULT MULTIVITAMIN W/MINERALS CH
1.0000 | ORAL_TABLET | Freq: Every day | ORAL | Status: DC
  Administered 2020-08-30: 1 via ORAL
  Filled 2020-08-30: qty 1

## 2020-08-30 MED ORDER — GADOBUTROL 1 MMOL/ML IV SOLN
7.0000 mL | Freq: Once | INTRAVENOUS | Status: AC | PRN
  Administered 2020-08-30: 7 mL via INTRAVENOUS

## 2020-08-30 MED ORDER — SODIUM CHLORIDE 0.9 % IV SOLN: INTRAVENOUS | Status: AC

## 2020-08-30 MED ORDER — ONDANSETRON HCL 4 MG/2ML IJ SOLN: 4.0000 mg | Freq: Four times a day (QID) | INTRAMUSCULAR | Status: DC | PRN

## 2020-08-30 MED ORDER — HYDRALAZINE HCL 20 MG/ML IJ SOLN: 5.0000 mg | INTRAMUSCULAR | Status: DC | PRN

## 2020-08-30 MED ORDER — THIAMINE HCL 100 MG PO TABS: 100.0000 mg | ORAL_TABLET | Freq: Every day | ORAL | 1 refills | Status: DC

## 2020-08-30 MED ORDER — THIAMINE HCL 100 MG/ML IJ SOLN: 500.0000 mg | Freq: Three times a day (TID) | INTRAVENOUS | Status: DC

## 2020-08-30 NOTE — Consult Note (Addendum)
Neurology Consultation Reason for Consult: Confusion and first-time seizure Requesting Physician: Trey Paula  CC: Confusion  History is obtained from: Patient and chart review, unable to reach mother despite attempts  HPI: Gerald Anderson is a 30 y.o. male with a past medical history significant for remote Kawasaki disease (age 45), prior heavy alcohol use, intermittent marijuana use.  He presented to the ED today after waking up from a nap and being very confused.  He reports that he did have a fever at home but he is unsure how high it was and he was diaphoretic.  Otherwise he does not have any acute complaints recently though he is disoriented.  When I asked how long he has been confused he replies "my whole damn life."  He does report significant financial and social stressors with the mother of his child having moved out of state with their child back in October.  He continues to work building [employer] (a complicated task requiring a building, Nurse, adult) though he notes he has had to call out sometimes for "mental health days" he reports he is currently drinking only 2-3 times per week and marijuana very rarely once a month or less.  He reports he cut back on his drinking substantially given that it was affecting his health.  Denies any other substance use.  Denies any focal weakness.  Regarding seizure risk factors, he reports no history of meningitis/cerebritis, no significant prior concussions, and thinks his birth and development were normal.  He does report a family history of a cousin with seizures though he is unsure what type of seizures they had.  This cousin was just hospitalized for pneumonia in [place] and presents another social stressor for him.  ED course.  When he presented he was afebrile, hypertensive to 196/104, regular heart rate and respiratory rate.  He was initially somewhat confused and talking slowly, endorsing many stressors.  However on a  nursing check he was found to be having a generalized tonic-clonic seizure at which point he was given Ativan 2 mg (19:25) followed by loading dose of Keppra 1500 mg (19:49).  He has remained sleepy since then. Additionally given his alcohol use history he was given 500 mg thiamine IV x1 dose  ROS: Unable to obtain full review of systems given his somnolence, pertinent as obtained described above  Past Medical History:  Diagnosis Date  . Back pain   . Kawasaki disease [facility])    History reviewed. No pertinent surgical history.  Current Outpatient Medications  Medication Instructions  . acetaminophen (TYLENOL) 1,000 mg, Oral, Every 6 hours PRN  . cephALEXin (KEFLEX) 500 mg, Oral, 3 times daily  . HYDROcodone-acetaminophen (NORCO/VICODIN) 5-325 MG tablet 1 tablet, Oral, Every 6 hours PRN  . ibuprofen (ADVIL) 600 mg, Oral, Every 8 hours PRN  . ibuprofen (ADVIL) 400 mg, Oral, Every 6 hours PRN  . Menthol, Topical Analgesic, (ICY HOT BACK EX) 1 application, Apply externally, Daily PRN  . ondansetron (ZOFRAN ODT) 4 mg, Oral, Every 4 hours PRN  . pantoprazole (PROTONIX) 20 mg, Oral, Daily  . traMADol (ULTRAM) 50 mg, Oral, Every 6 hours PRN    No family history on file. Reports he has a cousin with seizures and diabetes, cannot tell me what type of seizures  Social History:  reports that he has never smoked. He has never used smokeless tobacco. He reports current alcohol use. He reports that he does not use drugs. other than occasional marijuana  Exam: Current  vital signs: BP 136/81 (BP Location: Right Arm)   Pulse 84   Temp 98 F (36.7 C) (Oral)   Resp 18   Ht 6\' 1"  (1.854 m)   Wt 75 kg   SpO2 98%   BMI 21.81 kg/m  Vital signs in last 24 hours: Temp:  [98 F (36.7 C)-98.7 F (37.1 C)] 98 F (36.7 C) (04/06 2348) Pulse Rate:  [80-105] 84 (04/06 2348) Resp:  [14-20] 18 (04/06 2300) BP: (103-196)/(52-105) 136/81 (04/06 2348) SpO2:  [89 %-100 %] 98 % (04/06 2348) Weight:  [75 kg]  75 kg (04/06 1904)  Physical Exam  Constitutional: Appears well-developed and well-nourished.  Psych: Affect appropriate to situation,  Eyes: No scleral injection HENT: No oropharyngeal obstruction.  He does have some pain with neck flexion but reports that this is chronic MSK: no joint deformities.  Cardiovascular: Normal rate and regular rhythm.  Respiratory: Effort normal, non-labored breathing GI: Soft.  No distension. There is no tenderness.  Skin: Warm dry and intact visible skin  Neuro: Mental Status: Patient is very sleepy but briefly awakes with gentle stimulation.  Oriented to person, place, year, and situation but not month (reports it is March) Patient is able to give some history, limited by somnolence No signs of aphasia or neglect Cranial Nerves: II: Visual Fields are full. Pupils are equal, round, and reactive to light 4 to 2 mm III,IV, VI: EOMI without ptosis or diploplia, frequently disconjugate gaze as he continually falls asleep even when I hold his eyes open V: Facial sensation is symmetric to light touch VII: Facial movement is symmetric.  VIII: hearing is intact to voice X: Uvula elevates symmetrically XI: Head turn is symmetric. XII: tongue is midline without atrophy or fasciculations.  Motor: Tone is low throughout. Bulk is normal.  Symmetric bilateral pronation and slight drift of the upper extremities.  Quick drift down to the bed of bilateral lower extremities.  Too sleepy to participate in confrontational strength testing. Sensory: Sensation is symmetric to light touch in arms and legs Deep Tendon Reflexes: 2+ and symmetric in the biceps and patellae.  Plantars: Toes are downgoing bilaterally.  Cerebellar: FNF and HKS are intact bilaterally within limits of sleepiness   I have reviewed labs in epic and the results pertinent to this consultation are: Initial BMP notable for mild hypercalcemia at 11.1, hyponatremia 148, glucose 127, bicarb reduced at  17, CMP notable for mildly elevated AST at 60 (2:1 ratio with ALT at 34) Despite being ordered stat while at [facility] ED, ethanol level was not drawn until 1:30 AM at which time it was undetectable  Initial CK 242 rising to 1196 on repeat Vitamin B12 low normal at 248  Thiamine pending  HIV negative RPR pending UA and UDS pending  I have reviewed the images obtained:  Head CT w/o acute intracranial process  MRI brain with and without contrast negative for acute intracranial process  Impression: This is a 30 year old male presenting with first-time generalized tonic-clonic seizure.  Overall his clinical course has been reassuring with no subsequent seizures and vital signs stable.  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  Given a single consider generalized tonic-clonic seizure without focality on his examination or imaging, will hold further antiseizure medications pending EEG as this will also increase sensitivity of EEG.  Currently low threshold for meningitis/encephalitis given the  overall clinical picture, but should he decline or become febrile empiric antibiotic coverage while awaiting lumbar puncture would be advised.  Recommendations: -Continue high-dose thiamine 500 mg every 8 hours x 3 days (needs 8 additional doses) while inpatient, transition to 100 milligrams daily p.o. on discharge unless lab results normal -Goal vitamin B12 greater than 400, start 1000 mcg oral daily -Consider addition of multivitamin, folate -Routine EEG (ordered) -Hold anti-seizure medications at this time, Ativan 2 mg as needed for seizure ongoing greater than 5 minutes -Frequent neuro checks -Holding on empiric meningitis coverage at this time, but low threshold to cover broadly with acyclovir, vancomycin, and ceftriaxone should the patient become febrile  or clinically decline -Follow-up pending labs as above -Aggressive IV fluids managed by primary team for rhabdomyolysis, trend CK to peak   Brooke Dare MD-PhD [facility] [phone] Available 7 PM to 7 AM, outside of these hours please call Neurologist on call as listed on Amion.

## 2020-08-30 NOTE — Progress Notes (Signed)
SAME DAY NOTE Patient seen and examined by Dr. Iver Nestle this morning Sitting comfortably and eating breakfast when I went to examine him. 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  Does not remember how and why he had come to the hospital Neurological examination reveals awake alert oriented, slightly emotional appearing African-American man comfortably sitting in bed and having breakfast.  No dysarthria in his speech.  No evidence of aphasia.  No cranial nerve deficits.  Strength appears to be symmetric in both upper and lower extremities.  On presentation had hypercalcemia, hypernatremia, glucose 147, AST ALT with about a 2:1 ratio, low normal B12 at 248, CK 1196. Urinalysis and UDS pending   Assessment 30 year old man presenting with confusion followed by first-time generalized tonic-clonic seizure.  No following seizures.  Loaded with Keppra in the ER.  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  Reports asked train stressors as above. Reports fever at home but has been afebrile here.  Was hypertensive with systolics in the 190s on arrival. Low suspicion for CNS infection Likely provoked seizure in the setting of marijuana and possible alcohol-withdrawal or overuse.  Other differentials include seizure in the setting of hypertension, PR ES-although brain imaging not consistent with PR ES Alcohol level was not drawn at the outside ED.  Recs -Hold off on antiepileptics -Continue high-dose thiamine as already recommended in the consultation -Replete B12 -Routine EEG -Seizure precautions were discussed in detail with him including no driving and no operating heavy machinery unless 6 months seizure-free according to the [employer].  He was visibly very upset upon hearing that. -We will follow EEG.  -- Milon Dikes, MD Neurologist [facility] Pager: [phone]   SEIZURE PRECAUTIONS Per [facility] statutes, patients with seizures are not allowed to drive until they have been seizure-free for six months.   Use caution when using heavy equipment or power tools. Avoid working on ladders or at heights. Take showers instead of baths. Ensure the water temperature is not too high on the home water heater. Do not go swimming alone. Do not lock yourself in a room alone (i.e. bathroom). When caring for infants or small children, sit down when holding, feeding, or changing them to minimize risk of injury to the child in the event you have a seizure. Maintain good sleep hygiene. Avoid alcohol.   If patient has another seizure, call 911 and bring them back to the ED if: A. The seizure lasts longer than 5 minutes.  B. The patient doesn't wake shortly after the seizure or has new problems such as difficulty seeing, speaking or moving following the seizure C. The patient was injured during the seizure D. The patient has a temperature over 102 F (39C) E. The patient vomited during the seizure and now is having trouble breathing

## 2020-08-30 NOTE — H&P (Signed)
History and Physical    Gerald Anderson JYN:[id number] DOB: 12/03/1990 DOA: 08/29/2020  PCP: Patient, No Pcp Per (Inactive) Patient coming from: [facility] ED  Chief Complaint: Memory loss  HPI: Gerald Anderson is a 30 y.o. male with medical history significant of back pain, Kawasaki disease presented to the ED with a chief complaint of memory loss.  Hypertensive on arrival to the ED with systolic in the 190s and had a witnessed tonic-clonic seizure.  He was given Ativan and Keppra loading dose.  Blood pressure improved with labetalol.  Head CT negative for acute finding.  Afebrile and labs showing no leukocytosis.  Not hypoglycemic.  Neurology consulted. He was also given IV thiamine and 1 L fluid bolus.  Patient is currently AAO x3.  States he wants to sleep and does not wish to give any history or answer any additional questions at this time.  Review of Systems:  All systems reviewed and apart from history of presenting illness, are negative.  Past Medical History:  Diagnosis Date  . Back pain   . Kawasaki disease [facility])     History reviewed. No pertinent surgical history.   reports that he has never smoked. He has never used smokeless tobacco. He reports current alcohol use. He reports that he does not use drugs.  No Known Allergies  Family history: Patient refuses to answer any questions.  Prior to Admission medications   Medication Sig Start Date End Date Taking? Authorizing Provider  acetaminophen (TYLENOL) 500 MG tablet Take 1,000 mg by mouth every 6 (six) hours as needed for mild pain (back pain).    [provider]  cephALEXin (KEFLEX) 500 MG capsule Take 1 capsule (500 mg total) by mouth 3 (three) times daily. Patient not taking: Reported on 03/20/2018 01/18/17   Azalia Bilis, MD  HYDROcodone-acetaminophen (NORCO/VICODIN) 5-325 MG tablet Take 1 tablet by mouth every 6 (six) hours as needed for moderate pain. Patient not taking: Reported on 03/20/2018 01/18/17   Azalia Bilis, MD  ibuprofen (ADVIL,MOTRIN) 200 MG tablet Take 400 mg by mouth every 6 (six) hours as needed for moderate pain.    [provider]  ibuprofen (ADVIL,MOTRIN) 600 MG tablet Take 1 tablet (600 mg total) by mouth every 8 (eight) hours as needed. Patient not taking: Reported on 03/20/2018 01/18/17   Azalia Bilis, MD  Menthol, Topical Analgesic, (ICY HOT BACK EX) Apply 1 application topically daily as needed (Back Pain).    [provider]  ondansetron (ZOFRAN ODT) 4 MG disintegrating tablet Take 1 tablet (4 mg total) by mouth every 4 (four) hours as needed for nausea or vomiting. 03/20/18   Arby Barrette, MD  pantoprazole (PROTONIX) 20 MG tablet Take 1 tablet (20 mg total) by mouth daily. 03/20/18   Arby Barrette, MD  traMADol (ULTRAM) 50 MG tablet Take 1 tablet (50 mg total) by mouth every 6 (six) hours as needed. Patient not taking: Reported on 03/20/2018 01/25/17   Janne Napoleon, NP    Physical Exam: Vitals:   08/29/20 2130 08/29/20 2200 08/29/20 2300 08/29/20 2348  BP: (!) 142/89 115/61 (!) 103/52 136/81  Pulse: 89 90 98 84  Resp: 14 16 18    Temp:    98 F (36.7 C)  TempSrc:    Oral  SpO2: (!) 89% 96% 97% 98%  Weight:      Height:        Physical Exam Constitutional:      General: He is not in acute distress. HENT:  Head: Normocephalic and atraumatic.  Eyes:     Extraocular Movements: Extraocular movements intact.     Conjunctiva/sclera: Conjunctivae normal.  Cardiovascular:     Rate and Rhythm: Normal rate and regular rhythm.     Pulses: Normal pulses.  Pulmonary:     Effort: Pulmonary effort is normal. No respiratory distress.     Breath sounds: Normal breath sounds. No wheezing or rales.  Abdominal:     General: Bowel sounds are normal. There is no distension.     Palpations: Abdomen is soft.     Tenderness: There is no abdominal tenderness.  Musculoskeletal:     Cervical back: Normal range of motion and neck supple.  Skin:    General:  Skin is warm and dry.  Neurological:     Mental Status: He is alert and oriented to person, place, and time.     Comments: Moving all extremities on command, no focal weakness.     Labs on Admission: I have personally reviewed following labs and imaging studies  CBC: Recent Labs  Lab 08/29/20 1941  WBC 7.2  NEUTROABS 4.5  HGB 15.4  HCT 49.7  MCV 99.8  PLT 173   Basic Metabolic Panel: Recent Labs  Lab 08/29/20 1941  NA 148*  K 4.5  CL 102  CO2 17*  GLUCOSE 127*  BUN 13  CREATININE 1.06  CALCIUM 11.1*   GFR: Estimated Creatinine Clearance: 111 mL/min (by C-G formula based on SCr of 1.06 mg/dL). Liver Function Tests: No results for input(s): AST, ALT, ALKPHOS, BILITOT, PROT, ALBUMIN in the last 168 hours. No results for input(s): LIPASE, AMYLASE in the last 168 hours. No results for input(s): AMMONIA in the last 168 hours. Coagulation Profile: No results for input(s): INR, PROTIME in the last 168 hours. Cardiac Enzymes: Recent Labs  Lab 08/29/20 1941  CKTOTAL 242   BNP (last 3 results) No results for input(s): PROBNP in the last 8760 hours. HbA1C: No results for input(s): HGBA1C in the last 72 hours. CBG: Recent Labs  Lab 08/29/20 1939  GLUCAP 98   Lipid Profile: No results for input(s): CHOL, HDL, LDLCALC, TRIG, CHOLHDL, LDLDIRECT in the last 72 hours. Thyroid Function Tests: No results for input(s): TSH, T4TOTAL, FREET4, T3FREE, THYROIDAB in the last 72 hours. Anemia Panel: No results for input(s): VITAMINB12, FOLATE, FERRITIN, TIBC, IRON, RETICCTPCT in the last 72 hours. Urine analysis:    Component Value Date/Time   COLORURINE YELLOW 03/20/2018 1435   APPEARANCEUR CLEAR 03/20/2018 1435   LABSPEC 1.019 03/20/2018 1435   PHURINE 5.0 03/20/2018 1435   GLUCOSEU NEGATIVE 03/20/2018 1435   HGBUR NEGATIVE 03/20/2018 1435   BILIRUBINUR NEGATIVE 03/20/2018 1435   KETONESUR 20 (A) 03/20/2018 1435   PROTEINUR NEGATIVE 03/20/2018 1435   UROBILINOGEN 0.2  07/19/2014 0720   NITRITE NEGATIVE 03/20/2018 1435   LEUKOCYTESUR NEGATIVE 03/20/2018 1435    Radiological Exams on Admission: CT Head Wo Contrast  Result Date: 08/29/2020 CLINICAL DATA:  Mental status change, unknown cause. Memory loss. Seizure. EXAM: CT HEAD WITHOUT CONTRAST TECHNIQUE: Contiguous axial images were obtained from the base of the skull through the vertex without intravenous contrast. COMPARISON:  01/17/2017 FINDINGS: Brain: There is no evidence of an acute infarct, intracranial hemorrhage, mass, midline shift, or extra-axial fluid collection. The ventricles and sulci are normal. Vascular: No hyperdense vessel. Skull: No fracture or suspicious osseous lesion. Sinuses/Orbits: Mild bilateral ethmoid and left maxillary sinus mucosal thickening. Clear visualized mastoid air cells. Unremarkable orbits. Other: None. IMPRESSION: Unremarkable CT  appearance of the brain. Electronically Signed   By: Sebastian Ache M.D.   On: 08/29/2020 20:33    EKG: Independently reviewed.  Sinus tachycardia, rate increased since prior tracing.  Assessment/Plan Principal Problem:   Seizure [facility] Active Problems:   Hypertensive urgency   Hypernatremia   Metabolic acidosis   Seizure Patient presented to the ED for evaluation of memory loss.  Upon arrival his blood pressure was elevated with systolic in the 190s and he had a witnessed tonic-clonic seizure.  He was given Keppra loading dose.  Head CT negative for acute finding.  No fever or leukocytosis.  Not hypoglycemic. -Neurology consulted and recommending holding off scheduling antiepileptic drugs at this time, will make recommendations based on lab results.  Patient was given IV thiamine in the ED.  B1 level pending.  UA, UDS, blood ethanol level, B12 level, RPR pending.  EEG pending.  Brain MRI ordered.  Seizure precautions.  Hypertensive urgency Blood pressure now improved after labetalol.  Currently normotensive.  No documented history of  hypertension. -Start amlodipine 5 mg daily.  IV hydralazine PRN.  Mild hypernatremia Possibly due to dehydration.  Sodium 148. -He received IV fluid bolus in the ED.  Repeat labs to check sodium level.  High anion gap metabolic acidosis Bicarb 17, anion gap 29.  No history of diabetes or significant hyperglycemia. -IV fluid hydration.  Check blood ethanol and lactic acid level pending.  Also check salicylate level.  Abnormal lab Calcium 11.1 on BMP. -Check albumin level to calculate corrected calcium level.  DVT prophylaxis: Lovenox Code Status: Full code Family Communication: No family available at this time. Disposition Plan: Status is: Inpatient  Remains inpatient appropriate because:Inpatient level of care appropriate due to severity of illness   Dispo: The patient is from: Home              Anticipated d/c is to: Home              Patient currently is not medically stable to d/c.   Difficult to place patient No  Level of care: Level of care: Telemetry Medical   The medical decision making on this patient was of high complexity and the patient is at high risk for clinical deterioration, therefore this is a level 3 visit.  John Giovanni MD [facility]  If 7PM-7AM, please contact night-coverage [email or web address]  08/30/2020, 1:39 AM

## 2020-08-30 NOTE — Evaluation (Signed)
Physical Therapy Evaluation and D/C Patient Details Name: Gerald Anderson MRN: [id number] DOB: 06/30/1990 Today's Date: 08/30/2020   History of Present Illness  Gerald Anderson is a 30 y.o. male with medical history significant of back pain, Kawasaki disease presented to the ED with a chief complaint of memory loss.  Hypertensive on arrival to the ED with systolic in the 190s and had a witnessed tonic-clonic seizure.  He was given Ativan and Keppra loading dose.  Blood pressure improved with labetalol.  Head CT negative for acute finding.  Clinical Impression  Pt admitted with above diagnosis. Pt was able to ambulate with independence with challenges to balance. Pt does not have any skilled PT needs at this time. Will sign off.    Follow Up Recommendations No PT follow up    Equipment Recommendations  None recommended by PT    Recommendations for Other Services       Precautions / Restrictions Precautions Precautions: None Restrictions Weight Bearing Restrictions: No      Mobility  Bed Mobility Overal bed mobility: Independent                  Transfers Overall transfer level: Independent                  Ambulation/Gait Ambulation/Gait assistance: Independent Gait Distance (Feet): 600 Feet Assistive device: None Gait Pattern/deviations: WFL(Within Functional Limits)     General Gait Details: No issues with gait with challenges to balance.  Stairs            Wheelchair Mobility    Modified Rankin (Stroke Patients Only)       Balance Overall balance assessment: No apparent balance deficits (not formally assessed)                                           Pertinent Vitals/Pain Pain Assessment: No/denies pain    Home Living Family/patient expects to be discharged to:: Private residence Living Arrangements: Alone Available Help at Discharge: Friend(s);Available PRN/intermittently Type of Home: Apartment Home Access: Level  entry     Home Layout: One level Home Equipment: None      Prior Function Level of Independence: Independent               Hand Dominance   Dominant Hand: Right    Extremity/Trunk Assessment   Upper Extremity Assessment Upper Extremity Assessment: Defer to OT evaluation    Lower Extremity Assessment Lower Extremity Assessment: Overall WFL for tasks assessed    Cervical / Trunk Assessment Cervical / Trunk Assessment: Normal  Communication   Communication: No difficulties  Cognition Arousal/Alertness: Awake/alert Behavior During Therapy: WFL for tasks assessed/performed Overall Cognitive Status: Within Functional Limits for tasks assessed                                        General Comments      Exercises     Assessment/Plan    PT Assessment Patent does not need any further PT services  PT Problem List         PT Treatment Interventions      PT Goals (Current goals can be found in the Care Plan section)  Acute Rehab PT Goals Patient Stated Goal: to go home today PT Goal Formulation: All assessment and  education complete, DC therapy    Frequency     Barriers to discharge        Co-evaluation               AM-PAC PT "6 Clicks" Mobility  Outcome Measure Help needed turning from your back to your side while in a flat bed without using bedrails?: None Help needed moving from lying on your back to sitting on the side of a flat bed without using bedrails?: None Help needed moving to and from a bed to a chair (including a wheelchair)?: None Help needed standing up from a chair using your arms (e.g., wheelchair or bedside chair)?: None Help needed to walk in hospital room?: None Help needed climbing 3-5 steps with a railing? : None 6 Click Score: 24    End of Session Equipment Utilized During Treatment: Gait belt Activity Tolerance: Patient tolerated treatment well Patient left: in bed;with call bell/phone within  reach;with bed alarm set Nurse Communication: Mobility status PT Visit Diagnosis: Muscle weakness (generalized) (M62.81)    Time: 1140-1156 PT Time Calculation (min) (ACUTE ONLY): 16 min   Charges:   PT Evaluation $PT Eval Low Complexity: 1 Low          Gerald Anderson M,PT Acute Rehab Services [phone] (pager)  Bevelyn Buckles 08/30/2020, 12:21 PM

## 2020-08-30 NOTE — Progress Notes (Signed)
EEG Completed; Results Pending; called Tele-specialists to read.

## 2020-08-30 NOTE — TOC Initial Note (Signed)
Transition of Care [facility]) - Initial/Assessment Note    Patient Details  Name: Gerald Anderson MRN: [id number] Date of Birth: 11/08/1990  Transition of Care [facility]) CM/SW Contact:    Pollie Friar, RN Phone Number: 08/30/2020, 1:47 PM  Clinical Narrative:                 Pt is from home alone. CM met with the patient to discuss PCP. Pt doesn't feel he needs one. CM went over the reasons to have PCP and he states he has been to [facility] in the past.  CM called [facility] and obtained an appt. Information on the AVS.  Pt wanting to d/c from the hospital. CM has updated the MD. TOC following.  Expected Discharge Plan: Home/Self Care Barriers to Discharge: Continued Medical Work up   Patient Goals and CMS Choice     Choice offered to / list presented to : Patient  Expected Discharge Plan and Services Expected Discharge Plan: Home/Self Care   Discharge Planning Services: CM Consult   Living arrangements for the past 2 months: Apartment                                      Prior Living Arrangements/Services Living arrangements for the past 2 months: Apartment Lives with:: Self Patient language and need for interpreter reviewed:: Yes Do you feel safe going back to the place where you live?: Yes            Criminal Activity/Legal Involvement Pertinent to Current Situation/Hospitalization: No - Comment as needed  Activities of Daily Living Home Assistive Devices/Equipment: None ADL Screening (condition at time of admission) Patient's cognitive ability adequate to safely complete daily activities?: Yes Is the patient deaf or have difficulty hearing?: No Does the patient have difficulty seeing, even when wearing glasses/contacts?: No Does the patient have difficulty concentrating, remembering, or making decisions?: Yes Patient able to express need for assistance with ADLs?: Yes Does the patient have difficulty dressing or bathing?: No Independently performs  ADLs?: Yes (appropriate for developmental age) Does the patient have difficulty walking or climbing stairs?: No Weakness of Legs: None Weakness of Arms/Hands: None  Permission Sought/Granted                  Emotional Assessment Appearance:: Appears stated age Attitude/Demeanor/Rapport: Complaining Affect (typically observed): Agitated Orientation: : Oriented to Self,Oriented to Place,Oriented to  Time,Oriented to Situation   Psych Involvement: No (comment)  Admission diagnosis:  Seizure ([place]) [R56.9] New onset seizure ([place]) [R56.9] Severe hypertension [I10] Patient Active Problem List   Diagnosis Date Noted  . Hypertensive urgency 08/30/2020  . Hypernatremia 08/30/2020  . Metabolic acidosis 40/98/1191  . Seizure ([place]) 08/29/2020  . Acute gangrenous tonsillitis 11/13/2014   PCP:  Patient, No Pcp Per (Inactive) Pharmacy:   [facility] [place], [place] [address] [place] [address] Phone: [phone] Fax: [phone]     Social Determinants of Health (SDOH) Interventions    Readmission Risk Interventions No flowsheet data found.

## 2020-08-30 NOTE — Progress Notes (Signed)
Gerald Anderson  D/C'd Home per MD order.  Discussed with the patient and all questions fully answered.  VSS, Skin clean, dry and intact without evidence of skin break down, no evidence of skin tears noted. IV catheter discontinued intact. Site without signs and symptoms of complications. Dressing and pressure applied.  An After Visit Summary was printed and given to the patient. Patient received prescription.  D/c education completed with patient including follow up instructions, medication list, d/c activities limitations if indicated, with other d/c instructions as indicated by MD - patient able to verbalize understanding, all questions fully answered.   Patient instructed to return to ED, call 911, or call MD for any changes in condition.   Patient escorted to elevator, and D/C home via private auto.  [name] 08/30/2020 5:17 PM

## 2020-08-30 NOTE — Procedures (Signed)
EEG Report Indication: possible seizure activity/memory loss   This study was recorded in the waking and (brief) sleep state.  The duration of the study was 24 minutes.  Electrodes were placed according to the International 10/20 system.  Video was reviewed/available for clinical correlation as needed.   In the waking state, clear background organization is seen with a distinct anterior - posterior voltage and frequency gradient and a symmetric and reactive posterior dominant rhythm of approximately 9-10 hertz.  Anteriorly, is the expected pattern of faster frequency, lower voltage waveforms.   During the drowsy state, there is attenuation of the gradient, a general shift to slower frequencies diffusely, a waxing and waning of the posterior dominant rhythm, and slow roving eye movements.   During sleep, there were well formed and symmetric sleep transients.    Hyperventilation: unremarkable Photic stimulation: unremarkable   There are no clear focal, paroxysmal or epileptiform abnormalities  or interhemispheric asymmetries.   Impression:   This is a normal waking and sleep study.  There are no clear focal or epileptiform abnormalities.

## 2020-08-30 NOTE — Progress Notes (Signed)
PROGRESS NOTE    Gerald Anderson  ZOX:[id number] DOB: 11/30/1990 DOA: 08/29/2020 PCP: Patient, No Pcp Per (Inactive)    Chief Complaint  Patient presents with  . Memory Loss    Brief Narrative:   Gerald Anderson is a 30 y.o. male with medical history significant of back pain, Kawasaki disease presented to the ED with a chief complaint of memory loss.  Hypertensive on arrival to the ED with systolic in the 190s and had a witnessed tonic-clonic seizure.  He was given Ativan and Keppra loading dose.  Blood pressure improved with labetalol.  Head CT negative for acute finding.  Afebrile and labs showing no leukocytosis.  Assessment & Plan:   Principal Problem:   Seizure (HCC) Active Problems:   Hypertensive urgency   Hypernatremia   Metabolic acidosis   Memory loss and one episode of generalized tonic-clonic seizures:  In the setting of prior alcohol use/substance abuse Initially loaded with keppra and currently off anti seizure medications.  EEG ordered and pending.  MRI is negative for acute stroke.  Vitamin b levels are pending.  Vitamin B12 level is 248. Seizure precautions. Neurology on board and appreciate recommendations. Neurology also recommends to hold off on antiepileptic drugs at this time and follow EEG.   Hypokalemia.  Replaced    Hypertensive urgency Blood pressure parameters have improved. Started the patient on amlodipine 5 mg daily, and as needed hydralazine.   Hypernatremia probably secondary to dehydration after fluid boluses repeat sodium levels have improved.   Hypercalcemia Probably secondary to dehydration.   Mild metabolic acidosis Probably secondary to dehydration Improved.    Mild rhabdomyolysis Probably secondary to seizures and dehydration We will continue to trend CK levels. Continue to gently hydrate.    DVT prophylaxis: (Lovenox Code Status: (Full code Family Communication: None at bedside Disposition:   Status is:  Inpatient  Remains inpatient appropriate because:Ongoing diagnostic testing needed not appropriate for outpatient work up   Dispo: The patient is from: Home              Anticipated d/c is to: Home              Patient currently is not medically stable to d/c.   Difficult to place patient No       Consultants:  Neurology  Procedures: none.   Antimicrobials: none.   Subjective: Appears comfortable, denies chest pain or sob, alert and answering questions appropriately.   Objective: Vitals:   08/29/20 2300 08/29/20 2348 08/30/20 0518 08/30/20 0752  BP: (!) 103/52 136/81 (!) 146/89 (!) 154/94  Pulse: 98 84 81 84  Resp: 18 17  19   Temp:  98 F (36.7 C) 97.7 F (36.5 C)   TempSrc:  Oral Oral   SpO2: 97% 98% 100% 100%  Weight:      Height:        Intake/Output Summary (Last 24 hours) at 08/30/2020 1116 Last data filed at 08/30/2020 0943 Gross per 24 hour  Intake 403.34 ml  Output --  Net 403.34 ml   Filed Weights   08/29/20 1904  Weight: 75 kg    Examination:  General exam: Appears calm and comfortable  Respiratory system: Clear to auscultation. Respiratory effort normal. Cardiovascular system: S1 & S2 heard, RRR. No JVD, murmurs, rubs, gallops or clicks. No pedal edema. Gastrointestinal system: Abdomen is nondistended, soft and nontender. Normal bowel sounds heard. Central nervous system: Alert and oriented. No focal neurological deficits. Extremities: Symmetric 5 x 5 power. Skin: No rashes,  lesions or ulcers Psychiatry:  Mood & affect appropriate.     Data Reviewed: I have personally reviewed following labs and imaging studies  CBC: Recent Labs  Lab 08/29/20 1941 08/30/20 0138 08/30/20 0657  WBC 7.2 7.7 6.4  NEUTROABS 4.5 5.7  --   HGB 15.4 13.0 12.7*  HCT 49.7 38.2* 38.6*  MCV 99.8 94.1 94.4  PLT 173 141* 139*    Basic Metabolic Panel: Recent Labs  Lab 08/29/20 1941 08/30/20 0138  NA 148* 136  K 4.5 3.3*  CL 102 102  CO2 17* 25  GLUCOSE  127* 81  BUN 13 10  CREATININE 1.06 0.88  CALCIUM 11.1* 8.4*    GFR: Estimated Creatinine Clearance: 133.8 mL/min (by C-G formula based on SCr of 0.88 mg/dL).  Liver Function Tests: Recent Labs  Lab 08/30/20 0138  AST 60*  ALT 34  ALKPHOS 38  BILITOT 1.0  PROT 6.4*  ALBUMIN 3.8    CBG: Recent Labs  Lab 08/29/20 1939  GLUCAP 98     Recent Results (from the past 240 hour(s))  Resp Panel by RT-PCR (Flu A&B, Covid) Nasopharyngeal Swab     Status: None   Collection Time: 08/29/20  8:58 PM   Specimen: Nasopharyngeal Swab; Nasopharyngeal(NP) swabs in vial transport medium  Result Value Ref Range Status   SARS Coronavirus 2 by RT PCR NEGATIVE NEGATIVE Final    Comment: (NOTE) SARS-CoV-2 target nucleic acids are NOT DETECTED.  The SARS-CoV-2 RNA is generally detectable in upper respiratory specimens during the acute phase of infection. The lowest concentration of SARS-CoV-2 viral copies this assay can detect is 138 copies/mL. A negative result does not preclude SARS-Cov-2 infection and should not be used as the sole basis for treatment or other patient management decisions. A negative result may occur with  improper specimen collection/handling, submission of specimen other than nasopharyngeal swab, presence of viral mutation(s) within the areas targeted by this assay, and inadequate number of viral copies(<138 copies/mL). A negative result must be combined with clinical observations, patient history, and epidemiological information. The expected result is Negative.  Fact Sheet for Patients:  BloggerCourse.com  Fact Sheet for Healthcare Providers:  SeriousBroker.it  This test is no t yet approved or cleared by the [place] FDA and  has been authorized for detection and/or diagnosis of SARS-CoV-2 by FDA under an Emergency Use Authorization (EUA). This EUA will remain  in effect (meaning this test can be used) for  the duration of the COVID-19 declaration under Section 564(b)(1) of the Act, 21 U.S.C.section 360bbb-3(b)(1), unless the authorization is terminated  or revoked sooner.       Influenza A by PCR NEGATIVE NEGATIVE Final   Influenza B by PCR NEGATIVE NEGATIVE Final    Comment: (NOTE) The Xpert Xpress SARS-CoV-2/FLU/RSV plus assay is intended as an aid in the diagnosis of influenza from Nasopharyngeal swab specimens and should not be used as a sole basis for treatment. Nasal washings and aspirates are unacceptable for Xpert Xpress SARS-CoV-2/FLU/RSV testing.  Fact Sheet for Patients: BloggerCourse.com  Fact Sheet for Healthcare Providers: SeriousBroker.it  This test is not yet approved or cleared by the [place] FDA and has been authorized for detection and/or diagnosis of SARS-CoV-2 by FDA under an Emergency Use Authorization (EUA). This EUA will remain in effect (meaning this test can be used) for the duration of the COVID-19 declaration under Section 564(b)(1) of the Act, 21 U.S.C. section 360bbb-3(b)(1), unless the authorization is terminated or revoked.  Performed at [facility]          Radiology Studies: DG Abd 1 View  Result Date: 08/30/2020 CLINICAL DATA:  MRI clearance EXAM: ABDOMEN - 1 VIEW COMPARISON:  None FINDINGS: No radiopaque foreign bodies. Nonobstructive bowel gas pattern. No organomegaly, suspicious calcification or free air. IMPRESSION: No acute findings.  No radiopaque foreign bodies. Electronically Signed   By: Charlett Nose M.D.   On: 08/30/2020 01:38   CT Head Wo Contrast  Result Date: 08/29/2020 CLINICAL DATA:  Mental status change, unknown cause. Memory loss. Seizure. EXAM: CT HEAD WITHOUT CONTRAST TECHNIQUE: Contiguous axial images were obtained from the base of the skull through the vertex without intravenous contrast. COMPARISON:  01/17/2017 FINDINGS: Brain: There is no evidence  of an acute infarct, intracranial hemorrhage, mass, midline shift, or extra-axial fluid collection. The ventricles and sulci are normal. Vascular: No hyperdense vessel. Skull: No fracture or suspicious osseous lesion. Sinuses/Orbits: Mild bilateral ethmoid and left maxillary sinus mucosal thickening. Clear visualized mastoid air cells. Unremarkable orbits. Other: None. IMPRESSION: Unremarkable CT appearance of the brain. Electronically Signed   By: Sebastian Ache M.D.   On: 08/29/2020 20:33   MR BRAIN W WO CONTRAST  Result Date: 08/30/2020 CLINICAL DATA:  30 year old male with altered mental status, seizure. EXAM: MRI HEAD WITHOUT AND WITH CONTRAST TECHNIQUE: Multiplanar, multiecho pulse sequences of the brain and surrounding structures were obtained without and with intravenous contrast. CONTRAST:  61mL GADAVIST GADOBUTROL 1 MMOL/ML IV SOLN COMPARISON:  Head CT without contrast yesterday. FINDINGS: Intermittently this exam is moderately degraded by motion despite repeated imaging attempts. T2 and FLAIR axial imaging, precontrast T1 imaging are especially degraded. Brain: No restricted diffusion to suggest acute infarction. No midline shift, mass effect, evidence of mass lesion, ventriculomegaly, extra-axial collection or acute intracranial hemorrhage. Cervicomedullary junction and pituitary are within normal limits. Thin slice coronal T2 and FLAIR imaging is vastly superior to the axial sequences today. On those images the hippocampal formations and mesial temporal lobe structures appear symmetric and within normal limits. Minor nonspecific frontal lobe subcortical white matter changes are identified on the coronal FLAIR. Right corona radiata developmental venous anomaly (normal variant). No chronic cerebral blood products identified. No encephalomalacia is evident. No abnormal enhancement identified. No dural thickening. Vascular: Major intracranial vascular flow voids grossly preserved. Better vascular detail on  postcontrast T1 weighted axial images where the major dural venous sinuses are enhancing and appear to be patent. Skull and upper cervical spine: Grossly negative. Sinuses/Orbits: Negative orbits. Mild paranasal sinus mucosal thickening. Other: Visible internal auditory structures appear normal. Mild left mastoid air cell fluid. Negative visible scalp and face. IMPRESSION: 1. Some portions of this exam are moderately degraded despite repeated imaging attempts. 2. No acute intracranial abnormality is evident. Largely negative MRI appearance of the brain. Electronically Signed   By: Odessa Fleming M.D.   On: 08/30/2020 04:29   DG Pelvis Portable  Result Date: 08/30/2020 CLINICAL DATA:  MRI clearance EXAM: PORTABLE PELVIS 1-2 VIEWS COMPARISON:  None. FINDINGS: No metallic foreign bodies. Normal bowel gas pattern. No acute bony abnormality. IMPRESSION: No radiopaque foreign body. Electronically Signed   By: Charlett Nose M.D.   On: 08/30/2020 01:37   DG CHEST PORT 1 VIEW  Result Date: 08/30/2020 CLINICAL DATA:  MRI clearance EXAM: PORTABLE CHEST 1 VIEW COMPARISON:  03/20/2018 FINDINGS: No radiopaque foreign bodies. Heart and mediastinal contours are within normal limits. No focal opacities or effusions. No acute bony abnormality. IMPRESSION: No acute cardiopulmonary disease. Electronically Signed  By: Charlett Nose M.D.   On: 08/30/2020 01:37        Scheduled Meds: . amLODipine  5 mg Oral Daily  . enoxaparin (LOVENOX) injection  40 mg Subcutaneous Daily  . folic acid  1 mg Oral Daily  . multivitamin with minerals  1 tablet Oral Daily  . vitamin B-12  1,000 mcg Oral Daily   Continuous Infusions: . sodium chloride 200 mL/hr at 08/30/20 1007  . thiamine injection 500 mg (08/30/20 1006)     LOS: 1 day        Kathlen Mody, MD [facility]   To contact the attending provider between 7A-7P or the covering provider during after hours 7P-7A, please log into the web site [email or web address] and access  using universal [place] password for that web site. If you do not have the password, please call the hospital operator.  08/30/2020, 11:16 AM

## 2020-08-31 NOTE — Discharge Summary (Signed)
Physician Discharge Summary  Gerald Anderson ZOX:[id number] DOB: 11/12/1990 DOA: 08/29/2020  PCP: Patient, No Pcp Per (Inactive)  Admit date: 08/29/2020 Discharge date: 08/30/2020  Admitted From: Home Disposition:  Home  Recommendations for Outpatient Follow-up:  1. Follow up with PCP in 1-2 weeks 2. Please obtain BMP/CBC in one week Please follow up with Neurology in 1 to 2 weeks as recommended.   Discharge Condition:stable. CODE STATUS: full code.  Diet recommendation: Heart Healthy  Brief/Interim Summary: Gerald Hyshawis a 30 y.o.malewith medical history significant ofback pain, Kawasaki disease presented to the ED with a chief complaint of memory loss. Hypertensive on arrival to the ED with systolic in the 190s and had a witnessed tonic-clonic seizure. He was given Ativan and Keppra loading dose. Blood pressure improved with labetalol. Head CT negative for acute finding. Afebrile and labs showing no leukocytosis.  Discharge Diagnoses:  Principal Problem:   Seizure [facility]) Active Problems:   Hypertensive urgency   Hypernatremia   Metabolic acidosis  Memory loss and one episode of generalized tonic-clonic seizures:  In the setting of prior alcohol use/substance abuse Initially loaded with keppra and currently off anti seizure medications.  EEG ordered and negative MRI is negative for acute stroke.  Vitamin b levels are pending.  Vitamin B12 level is 248. Seizure precautions. Neurology on board and appreciate recommendations. Neurology also recommends to hold off on antiepileptic drugs at this time and follow EEG. Outpatient follow up with neurology.    Hypokalemia.  Replaced    Hypertensive urgency Blood pressure parameters have improved. Started the patient on amlodipine 5 mg daily, and as needed hydralazine.   Hypernatremia probably secondary to dehydration after fluid boluses repeat sodium levels have improved.   Hypercalcemia Probably secondary to  dehydration.   Mild metabolic acidosis Probably secondary to dehydration Improved.    Mild rhabdomyolysis Probably secondary to seizures and dehydration   Discharge Instructions  Discharge Instructions    Ambulatory referral to Neurology   Complete by: As directed    An appointment is requested in approximately: {1 week   Diet - low sodium heart healthy   Complete by: As directed    Discharge instructions   Complete by: As directed    Follow up with neurology in one week.     Allergies as of 08/30/2020   No Known Allergies     Medication List    STOP taking these medications   cephALEXin 500 MG capsule Commonly known as: KEFLEX   HYDROcodone-acetaminophen 5-325 MG tablet Commonly known as: NORCO/VICODIN   ibuprofen 200 MG tablet Commonly known as: ADVIL   ibuprofen 600 MG tablet Commonly known as: ADVIL   ondansetron 4 MG disintegrating tablet Commonly known as: Zofran ODT   traMADol 50 MG tablet Commonly known as: ULTRAM     TAKE these medications   acetaminophen 500 MG tablet Commonly known as: TYLENOL Take 1,000 mg by mouth every 6 (six) hours as needed for mild pain (back pain).   amLODipine 5 MG tablet Commonly known as: NORVASC Take 1 tablet (5 mg total) by mouth daily.   cyanocobalamin 1000 MCG tablet Take 1 tablet (1,000 mcg total) by mouth daily.   folic acid 1 MG tablet Commonly known as: FOLVITE Take 1 tablet (1 mg total) by mouth daily.   ICY HOT BACK EX Apply 1 application topically daily as needed (Back Pain).   multivitamin with minerals Tabs tablet Take 1 tablet by mouth daily.   pantoprazole 20 MG tablet Commonly known as: PROTONIX  Take 1 tablet (20 mg total) by mouth daily.   thiamine 100 MG tablet Take 1 tablet (100 mg total) by mouth daily.       Follow-up Information    [facility] Follow up on 09/10/2020.   Why: Your appointment is at 2:30 pm. Please arrive early and bring: picture ID, current  medications, insurance card Contact information: [address] [place] [id number] [phone]              No Known Allergies  Consultations:  neurology   Procedures/Studies: DG Abd 1 View  Result Date: 08/30/2020 CLINICAL DATA:  MRI clearance EXAM: ABDOMEN - 1 VIEW COMPARISON:  None FINDINGS: No radiopaque foreign bodies. Nonobstructive bowel gas pattern. No organomegaly, suspicious calcification or free air. IMPRESSION: No acute findings.  No radiopaque foreign bodies. Electronically Signed   By: Charlett NoseKevin  Dover M.D.   On: 08/30/2020 01:38   CT Head Wo Contrast  Result Date: 08/29/2020 CLINICAL DATA:  Mental status change, unknown cause. Memory loss. Seizure. EXAM: CT HEAD WITHOUT CONTRAST TECHNIQUE: Contiguous axial images were obtained from the base of the skull through the vertex without intravenous contrast. COMPARISON:  01/17/2017 FINDINGS: Brain: There is no evidence of an acute infarct, intracranial hemorrhage, mass, midline shift, or extra-axial fluid collection. The ventricles and sulci are normal. Vascular: No hyperdense vessel. Skull: No fracture or suspicious osseous lesion. Sinuses/Orbits: Mild bilateral ethmoid and left maxillary sinus mucosal thickening. Clear visualized mastoid air cells. Unremarkable orbits. Other: None. IMPRESSION: Unremarkable CT appearance of the brain. Electronically Signed   By: Sebastian AcheAllen  Grady M.D.   On: 08/29/2020 20:33   MR BRAIN W WO CONTRAST  Result Date: 08/30/2020 CLINICAL DATA:  30 year old male with altered mental status, seizure. EXAM: MRI HEAD WITHOUT AND WITH CONTRAST TECHNIQUE: Multiplanar, multiecho pulse sequences of the brain and surrounding structures were obtained without and with intravenous contrast. CONTRAST:  7mL GADAVIST GADOBUTROL 1 MMOL/ML IV SOLN COMPARISON:  Head CT without contrast yesterday. FINDINGS: Intermittently this exam is moderately degraded by motion despite repeated imaging attempts. T2 and FLAIR axial  imaging, precontrast T1 imaging are especially degraded. Brain: No restricted diffusion to suggest acute infarction. No midline shift, mass effect, evidence of mass lesion, ventriculomegaly, extra-axial collection or acute intracranial hemorrhage. Cervicomedullary junction and pituitary are within normal limits. Thin slice coronal T2 and FLAIR imaging is vastly superior to the axial sequences today. On those images the hippocampal formations and mesial temporal lobe structures appear symmetric and within normal limits. Minor nonspecific frontal lobe subcortical white matter changes are identified on the coronal FLAIR. Right corona radiata developmental venous anomaly (normal variant). No chronic cerebral blood products identified. No encephalomalacia is evident. No abnormal enhancement identified. No dural thickening. Vascular: Major intracranial vascular flow voids grossly preserved. Better vascular detail on postcontrast T1 weighted axial images where the major dural venous sinuses are enhancing and appear to be patent. Skull and upper cervical spine: Grossly negative. Sinuses/Orbits: Negative orbits. Mild paranasal sinus mucosal thickening. Other: Visible internal auditory structures appear normal. Mild left mastoid air cell fluid. Negative visible scalp and face. IMPRESSION: 1. Some portions of this exam are moderately degraded despite repeated imaging attempts. 2. No acute intracranial abnormality is evident. Largely negative MRI appearance of the brain. Electronically Signed   By: Odessa FlemingH  Hall M.D.   On: 08/30/2020 04:29   DG Pelvis Portable  Result Date: 08/30/2020 CLINICAL DATA:  MRI clearance EXAM: PORTABLE PELVIS 1-2 VIEWS COMPARISON:  None. FINDINGS: No metallic foreign bodies.  Normal bowel gas pattern. No acute bony abnormality. IMPRESSION: No radiopaque foreign body. Electronically Signed   By: Charlett Nose M.D.   On: 08/30/2020 01:37   DG CHEST PORT 1 VIEW  Result Date: 08/30/2020 CLINICAL DATA:  MRI  clearance EXAM: PORTABLE CHEST 1 VIEW COMPARISON:  03/20/2018 FINDINGS: No radiopaque foreign bodies. Heart and mediastinal contours are within normal limits. No focal opacities or effusions. No acute bony abnormality. IMPRESSION: No acute cardiopulmonary disease. Electronically Signed   By: Charlett Nose M.D.   On: 08/30/2020 01:37   EEG adult  Result Date: 08/30/2020 Lilian Coma, MD     08/30/2020 12:43 PM EEG Report Indication: possible seizure activity/memory loss  This study was recorded in the waking and (brief) sleep state.  The duration of the study was 24 minutes.  Electrodes were placed according to the International 10/20 system.  Video was reviewed/available for clinical correlation as needed.  In the waking state, clear background organization is seen with a distinct anterior - posterior voltage and frequency gradient and a symmetric and reactive posterior dominant rhythm of approximately 9-10 hertz.  Anteriorly, is the expected pattern of faster frequency, lower voltage waveforms.  During the drowsy state, there is attenuation of the gradient, a general shift to slower frequencies diffusely, a waxing and waning of the posterior dominant rhythm, and slow roving eye movements.  During sleep, there were well formed and symmetric sleep transients.  Hyperventilation: unremarkable Photic stimulation: unremarkable  There are no clear focal, paroxysmal or epileptiform abnormalities  or interhemispheric asymmetries.  Impression:  This is a normal waking and sleep study.  There are no clear focal or epileptiform abnormalities.          Subjective: Pt wants to go home.   Discharge Exam: Vitals:   08/30/20 0752 08/30/20 1128  BP: (!) 154/94 (!) 151/99  Pulse: 84   Resp: 19   Temp:  98.5 F (36.9 C)  SpO2: 100% 100%   Vitals:   08/29/20 2348 08/30/20 0518 08/30/20 0752 08/30/20 1128  BP: 136/81 (!) 146/89 (!) 154/94 (!) 151/99  Pulse: 84 81 84   Resp: 17  19   Temp: 98 F (36.7 C)  97.7 F (36.5 C)  98.5 F (36.9 C)  TempSrc: Oral Oral    SpO2: 98% 100% 100% 100%  Weight:      Height:        General: Pt is alert, awake, not in acute distress Cardiovascular: RRR, S1/S2 +, no rubs, no gallops Respiratory: CTA bilaterally, no wheezing, no rhonchi Abdominal: Soft, NT, ND, bowel sounds + Extremities: no edema, no cyanosis    The results of significant diagnostics from this hospitalization (including imaging, microbiology, ancillary and laboratory) are listed below for reference.     Microbiology: Recent Results (from the past 240 hour(s))  Resp Panel by RT-PCR (Flu A&B, Covid) Nasopharyngeal Swab     Status: None   Collection Time: 08/29/20  8:58 PM   Specimen: Nasopharyngeal Swab; Nasopharyngeal(NP) swabs in vial transport medium  Result Value Ref Range Status   SARS Coronavirus 2 by RT PCR NEGATIVE NEGATIVE Final    Comment: (NOTE) SARS-CoV-2 target nucleic acids are NOT DETECTED.  The SARS-CoV-2 RNA is generally detectable in upper respiratory specimens during the acute phase of infection. The lowest concentration of SARS-CoV-2 viral copies this assay can detect is 138 copies/mL. A negative result does not preclude SARS-Cov-2 infection and should not be used as the sole basis for treatment or other  patient management decisions. A negative result may occur with  improper specimen collection/handling, submission of specimen other than nasopharyngeal swab, presence of viral mutation(s) within the areas targeted by this assay, and inadequate number of viral copies(<138 copies/mL). A negative result must be combined with clinical observations, patient history, and epidemiological information. The expected result is Negative.  Fact Sheet for Patients:  BloggerCourse.com  Fact Sheet for Healthcare Providers:  SeriousBroker.it  This test is no t yet approved or cleared by the [place] FDA and  has  been authorized for detection and/or diagnosis of SARS-CoV-2 by FDA under an Emergency Use Authorization (EUA). This EUA will remain  in effect (meaning this test can be used) for the duration of the COVID-19 declaration under Section 564(b)(1) of the Act, 21 U.S.C.section 360bbb-3(b)(1), unless the authorization is terminated  or revoked sooner.       Influenza A by PCR NEGATIVE NEGATIVE Final   Influenza B by PCR NEGATIVE NEGATIVE Final    Comment: (NOTE) The Xpert Xpress SARS-CoV-2/FLU/RSV plus assay is intended as an aid in the diagnosis of influenza from Nasopharyngeal swab specimens and should not be used as a sole basis for treatment. Nasal washings and aspirates are unacceptable for Xpert Xpress SARS-CoV-2/FLU/RSV testing.  Fact Sheet for Patients: BloggerCourse.com  Fact Sheet for Healthcare Providers: SeriousBroker.it  This test is not yet approved or cleared by the [place] FDA and has been authorized for detection and/or diagnosis of SARS-CoV-2 by FDA under an Emergency Use Authorization (EUA). This EUA will remain in effect (meaning this test can be used) for the duration of the COVID-19 declaration under Section 564(b)(1) of the Act, 21 U.S.C. section 360bbb-3(b)(1), unless the authorization is terminated or revoked.  Performed at [facility]      Labs: BNP (last 3 results) No results for input(s): BNP in the last 8760 hours. Basic Metabolic Panel: Recent Labs  Lab 08/29/20 1941 08/30/20 0138  NA 148* 136  K 4.5 3.3*  CL 102 102  CO2 17* 25  GLUCOSE 127* 81  BUN 13 10  CREATININE 1.06 0.88  CALCIUM 11.1* 8.4*   Liver Function Tests: Recent Labs  Lab 08/30/20 0138  AST 60*  ALT 34  ALKPHOS 38  BILITOT 1.0  PROT 6.4*  ALBUMIN 3.8   No results for input(s): LIPASE, AMYLASE in the last 168 hours. No results for input(s): AMMONIA in the last 168 hours. CBC: Recent Labs   Lab 08/29/20 1941 08/30/20 0138 08/30/20 0657  WBC 7.2 7.7 6.4  NEUTROABS 4.5 5.7  --   HGB 15.4 13.0 12.7*  HCT 49.7 38.2* 38.6*  MCV 99.8 94.1 94.4  PLT 173 141* 139*   Cardiac Enzymes: Recent Labs  Lab 08/29/20 1941 08/30/20 0138  CKTOTAL 242 1,196*   BNP: Invalid input(s): POCBNP CBG: Recent Labs  Lab 08/29/20 1939  GLUCAP 98   D-Dimer No results for input(s): DDIMER in the last 72 hours. Hgb A1c No results for input(s): HGBA1C in the last 72 hours. Lipid Profile No results for input(s): CHOL, HDL, LDLCALC, TRIG, CHOLHDL, LDLDIRECT in the last 72 hours. Thyroid function studies No results for input(s): TSH, T4TOTAL, T3FREE, THYROIDAB in the last 72 hours.  Invalid input(s): FREET3 Anemia work up Recent Labs    08/30/20 0138  VITAMINB12 248   Urinalysis    Component Value Date/Time   COLORURINE YELLOW 03/20/2018 1435   APPEARANCEUR CLEAR 03/20/2018 1435   LABSPEC 1.019 03/20/2018 1435   PHURINE 5.0 03/20/2018  1435   GLUCOSEU NEGATIVE 03/20/2018 1435   HGBUR NEGATIVE 03/20/2018 1435   BILIRUBINUR NEGATIVE 03/20/2018 1435   KETONESUR 20 (A) 03/20/2018 1435   PROTEINUR NEGATIVE 03/20/2018 1435   UROBILINOGEN 0.2 07/19/2014 0720   NITRITE NEGATIVE 03/20/2018 1435   LEUKOCYTESUR NEGATIVE 03/20/2018 1435   Sepsis Labs Invalid input(s): PROCALCITONIN,  WBC,  LACTICIDVEN Microbiology Recent Results (from the past 240 hour(s))  Resp Panel by RT-PCR (Flu A&B, Covid) Nasopharyngeal Swab     Status: None   Collection Time: 08/29/20  8:58 PM   Specimen: Nasopharyngeal Swab; Nasopharyngeal(NP) swabs in vial transport medium  Result Value Ref Range Status   SARS Coronavirus 2 by RT PCR NEGATIVE NEGATIVE Final    Comment: (NOTE) SARS-CoV-2 target nucleic acids are NOT DETECTED.  The SARS-CoV-2 RNA is generally detectable in upper respiratory specimens during the acute phase of infection. The lowest concentration of SARS-CoV-2 viral copies this assay can  detect is 138 copies/mL. A negative result does not preclude SARS-Cov-2 infection and should not be used as the sole basis for treatment or other patient management decisions. A negative result may occur with  improper specimen collection/handling, submission of specimen other than nasopharyngeal swab, presence of viral mutation(s) within the areas targeted by this assay, and inadequate number of viral copies(<138 copies/mL). A negative result must be combined with clinical observations, patient history, and epidemiological information. The expected result is Negative.  Fact Sheet for Patients:  BloggerCourse.com  Fact Sheet for Healthcare Providers:  SeriousBroker.it  This test is no t yet approved or cleared by the [place] FDA and  has been authorized for detection and/or diagnosis of SARS-CoV-2 by FDA under an Emergency Use Authorization (EUA). This EUA will remain  in effect (meaning this test can be used) for the duration of the COVID-19 declaration under Section 564(b)(1) of the Act, 21 U.S.C.section 360bbb-3(b)(1), unless the authorization is terminated  or revoked sooner.       Influenza A by PCR NEGATIVE NEGATIVE Final   Influenza B by PCR NEGATIVE NEGATIVE Final    Comment: (NOTE) The Xpert Xpress SARS-CoV-2/FLU/RSV plus assay is intended as an aid in the diagnosis of influenza from Nasopharyngeal swab specimens and should not be used as a sole basis for treatment. Nasal washings and aspirates are unacceptable for Xpert Xpress SARS-CoV-2/FLU/RSV testing.  Fact Sheet for Patients: BloggerCourse.com  Fact Sheet for Healthcare Providers: SeriousBroker.it  This test is not yet approved or cleared by the [place] FDA and has been authorized for detection and/or diagnosis of SARS-CoV-2 by FDA under an Emergency Use Authorization (EUA). This EUA will remain in  effect (meaning this test can be used) for the duration of the COVID-19 declaration under Section 564(b)(1) of the Act, 21 U.S.C. section 360bbb-3(b)(1), unless the authorization is terminated or revoked.  Performed at [facility]      Time coordinating discharge: 32 min SIGNED:   Kathlen Mody, MD  [facility] 08/31/2020, 8:56 AM

## 2020-09-14 ENCOUNTER — Encounter (HOSPITAL_COMMUNITY): Payer: Self-pay | Admitting: Family Medicine

## 2020-09-14 LAB — VITAMIN B1: Vitamin B1 (Thiamine): 108.1 nmol/L (ref 66.5–200.0)

## 2020-11-28 ENCOUNTER — Other Ambulatory Visit: Payer: Self-pay

## 2020-11-28 ENCOUNTER — Encounter (HOSPITAL_BASED_OUTPATIENT_CLINIC_OR_DEPARTMENT_OTHER): Payer: Self-pay | Admitting: Emergency Medicine

## 2020-11-28 ENCOUNTER — Emergency Department (HOSPITAL_BASED_OUTPATIENT_CLINIC_OR_DEPARTMENT_OTHER)
Admission: EM | Admit: 2020-11-28 | Discharge: 2020-11-28 | Disposition: A | Discharge status: Home / Self Care | Payer: No Typology Code available for payment source | Attending: Emergency Medicine | Admitting: Emergency Medicine

## 2020-11-28 DIAGNOSIS — R059 Cough, unspecified: Secondary | ICD-10-CM | POA: Diagnosis not present

## 2020-11-28 DIAGNOSIS — R509 Fever, unspecified: Secondary | ICD-10-CM | POA: Insufficient documentation

## 2020-11-28 DIAGNOSIS — R197 Diarrhea, unspecified: Secondary | ICD-10-CM | POA: Diagnosis not present

## 2020-11-28 DIAGNOSIS — R519 Headache, unspecified: Secondary | ICD-10-CM | POA: Diagnosis not present

## 2020-11-28 DIAGNOSIS — Z20822 Contact with and (suspected) exposure to covid-19: Secondary | ICD-10-CM | POA: Diagnosis not present

## 2020-11-28 DIAGNOSIS — R112 Nausea with vomiting, unspecified: Secondary | ICD-10-CM | POA: Insufficient documentation

## 2020-11-28 LAB — RESP PANEL BY RT-PCR (FLU A&B, COVID) ARPGX2
Influenza A by PCR: NEGATIVE
Influenza B by PCR: NEGATIVE
SARS Coronavirus 2 by RT PCR: NEGATIVE

## 2020-11-28 MED ORDER — POTASSIUM CHLORIDE ER 10 MEQ PO TBCR: 10.0000 meq | EXTENDED_RELEASE_TABLET | Freq: Every day | ORAL | 0 refills | Status: DC

## 2020-11-28 MED ORDER — AMOXICILLIN-POT CLAVULANATE 875-125 MG PO TABS: 1.0000 | ORAL_TABLET | Freq: Two times a day (BID) | ORAL | 0 refills | Status: AC

## 2020-11-28 MED ORDER — DICYCLOMINE HCL 20 MG PO TABS: 20.0000 mg | ORAL_TABLET | Freq: Two times a day (BID) | ORAL | 0 refills | Status: DC | PRN

## 2020-11-28 NOTE — ED Triage Notes (Signed)
Pt arrives to ED with x2 days of chills, headache, fever, diarrhea, and cough.

## 2020-11-28 NOTE — ED Provider Notes (Signed)
[facility] EMERGENCY DEPT Provider Note   CSN: [id number] Arrival date & time: 11/28/20  1139     History Chief Complaint  Patient presents with   Diarrhea   Chills    Gerald Anderson is a 30 y.o. male presenting to the ED with several symptoms.  He reports eating some barbeque on the 4th of July and subsequently developing nausea, vomiting, and diarrhea.  He reports watery diarrhea the past 2 days, nonbloody, with episodes every 3-4 hours.  He also reports subjective fevers and chills, headache, and a cough.  He was concerned about possible covid.  He has had diarrhea "when I eat bad food" before and wonders also whether he may have food poisoning.  He denies hx of abdominal surgery, colitis, or other abdominal infections.  He does not take any medications at baseline and reports NKDA.  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 No pertinent surgical history.     History reviewed. No pertinent family history.  Social History   Tobacco Use   Smoking status: Never   Smokeless tobacco: Never  Substance Use Topics   Alcohol use: Yes    Comment: socially   Drug use: No    Home Medications Prior to Admission medications   Medication Sig Start Date End Date Taking? Authorizing Provider  amoxicillin-clavulanate (AUGMENTIN) 875-125 MG tablet Take 1 tablet by mouth 2 (two) times daily for 7 days. 11/28/20 12/05/20 Yes Braeson Rupe, Kermit Balo, MD  dicyclomine (BENTYL) 20 MG tablet Take 1 tablet (20 mg total) by mouth 2 (two) times daily as needed for up to 16 doses for spasms. 11/28/20  Yes Karianne Nogueira, Kermit Balo, MD  potassium chloride (KLOR-CON) 10 MEQ tablet Take 1 tablet (10 mEq total) by  mouth daily for 15 days. 11/28/20 12/13/20 Yes Jc Veron, Kermit Balo, MD  acetaminophen (TYLENOL) 500 MG tablet Take 1,000 mg by mouth every 6 (six) hours as needed for mild pain (back pain).    [provider]  amLODipine (NORVASC) 5 MG tablet Take 1 tablet (5 mg total) by mouth daily. 08/31/20   Kathlen Mody, MD  folic acid (FOLVITE) 1 MG tablet Take 1 tablet (1 mg total) by mouth daily. 08/31/20   Kathlen Mody, MD  Menthol, Topical Analgesic, (ICY HOT BACK EX) Apply 1 application topically daily as needed (Back Pain).    [provider]  Multiple Vitamin (MULTIVITAMIN WITH MINERALS) TABS tablet Take 1 tablet by mouth daily. 08/31/20   Kathlen Mody, MD  pantoprazole (PROTONIX) 20 MG tablet Take 1 tablet (20 mg total) by mouth daily. 03/20/18   Arby Barrette, MD  thiamine 100 MG tablet Take 1 tablet (100 mg total) by mouth daily. 08/30/20   Kathlen Mody, MD  vitamin B-12 1000 MCG tablet Take 1 tablet (1,000 mcg total) by mouth daily. 08/31/20   Kathlen Mody, MD    Allergies    Patient has no known allergies.  Review of Systems   Review of Systems  Constitutional:  Positive for chills and fever.  Respiratory:  Positive for cough. Negative for shortness of breath.   Cardiovascular:  Negative for chest pain and palpitations.  Gastrointestinal:  Positive for abdominal  pain, diarrhea, nausea and vomiting. Negative for blood in stool and constipation.  Genitourinary:  Negative for dysuria and hematuria.  Musculoskeletal:  Negative for arthralgias and back pain.  Skin:  Negative for color change and rash.  Neurological:  Positive for headaches. Negative for syncope.  All other systems reviewed and are negative.  Physical Exam Updated Vital Signs BP (!) 159/99 (BP Location: Right Arm)   Pulse 65   Temp 98.6 F (37 C)   Resp 18   Ht 6\' 1"  (1.854 m)   Wt 69.4 kg   SpO2 97%   BMI 20.19 kg/m   Physical Exam Constitutional:      General: He is not in acute distress. HENT:      Head: Normocephalic and atraumatic.  Eyes:     Conjunctiva/sclera: Conjunctivae normal.     Pupils: Pupils are equal, round, and reactive to light.  Cardiovascular:     Rate and Rhythm: Normal rate and regular rhythm.  Pulmonary:     Effort: Pulmonary effort is normal. No respiratory distress.  Abdominal:     General: There is no distension.     Tenderness: There is abdominal tenderness in the left lower quadrant. There is no guarding or rebound. Negative signs include Murphy's sign and McBurney's sign.     Comments: Mild ttp  Skin:    General: Skin is warm and dry.  Neurological:     General: No focal deficit present.     Mental Status: He is alert and oriented to person, place, and time. Mental status is at baseline.  Psychiatric:        Mood and Affect: Mood normal.        Behavior: Behavior normal.    ED Results / Procedures / Treatments   Labs (all labs ordered are listed, but only abnormal results are displayed) Labs Reviewed  RESP PANEL BY RT-PCR (FLU A&B, COVID) [id number]    EKG None  Radiology No results found.  Procedures Procedures   Medications Ordered in ED Medications - No data to display  ED Course  I have reviewed the triage vital signs and the nursing notes.  Pertinent labs & imaging results that were available during my care of the patient were reviewed by me and considered in my medical decision making (see chart for details).  Ddx for these symptoms includes viral illness including covid vs food-borne illness vs colitis/diverticulitis vs IBS vs other  Clinically well appearing, no signs of sepsis at this time. Overall abd exam is benign - lower suspicion for appendicitis.  He is tolerating PO.  We had a discussion about checking blood tests for signs of dehydration with his diarrhea, and consideration of a CT scan if there may be concern for colitis/diverticulitis.  He does not want an IV, lab tests or further workup at this time.  He reports "I'll  be fine, I can come back if it gets worse."    I think it's reasonable to try symptomatic management with bentyl for cramping at home, some supplemental K for a few days.  If it's foodborne or a viral gastroenteritis I expect he'll improve over the next few days.  Return precautions were given.  I did prescribe an Augmentin prescription if he develops worsening symptoms of colitis including fevers and bloody stool, but also advised he should return to the ED if he has other emergent concerns or cannot keep down fluids.  I was able to answer all of his questions.  He requested  a work note which was provided.   Final Clinical Impression(s) / ED Diagnoses Final diagnoses:  Diarrhea, unspecified type    Rx / DC Orders ED Discharge Orders          Ordered    dicyclomine (BENTYL) 20 MG tablet  2 times daily PRN        11/28/20 1421    amoxicillin-clavulanate (AUGMENTIN) 875-125 MG tablet  2 times daily        11/28/20 1421    potassium chloride (KLOR-CON) 10 MEQ tablet  Daily        11/28/20 1421             Terald Sleeper, MD 11/28/20 1750

## 2020-11-28 NOTE — Discharge Instructions (Addendum)
If you start having fevers or blood in your stool, start taking the Augmentin antibiotic I prescribed for the full 7 days.  This is to treat for possible colitis or inflammation of the bowels.

## 2020-12-24 ENCOUNTER — Emergency Department (HOSPITAL_COMMUNITY): Payer: No Typology Code available for payment source

## 2020-12-24 ENCOUNTER — Encounter (HOSPITAL_COMMUNITY): Payer: Self-pay

## 2020-12-24 ENCOUNTER — Other Ambulatory Visit: Payer: Self-pay

## 2020-12-24 ENCOUNTER — Observation Stay (HOSPITAL_COMMUNITY)
Admission: EM | Admit: 2020-12-24 | Discharge: 2020-12-25 | Disposition: A | Discharge status: Home / Self Care | Payer: No Typology Code available for payment source | Attending: Internal Medicine | Admitting: Internal Medicine

## 2020-12-24 DIAGNOSIS — R7401 Elevation of levels of liver transaminase levels: Secondary | ICD-10-CM | POA: Diagnosis not present

## 2020-12-24 DIAGNOSIS — I10 Essential (primary) hypertension: Secondary | ICD-10-CM | POA: Insufficient documentation

## 2020-12-24 DIAGNOSIS — R569 Unspecified convulsions: Secondary | ICD-10-CM | POA: Diagnosis present

## 2020-12-24 DIAGNOSIS — G40909 Epilepsy, unspecified, not intractable, without status epilepticus: Secondary | ICD-10-CM

## 2020-12-24 DIAGNOSIS — Y900 Blood alcohol level of less than 20 mg/100 ml: Secondary | ICD-10-CM | POA: Insufficient documentation

## 2020-12-24 DIAGNOSIS — R1031 Right lower quadrant pain: Secondary | ICD-10-CM | POA: Insufficient documentation

## 2020-12-24 DIAGNOSIS — Z79899 Other long term (current) drug therapy: Secondary | ICD-10-CM | POA: Diagnosis not present

## 2020-12-24 DIAGNOSIS — R739 Hyperglycemia, unspecified: Secondary | ICD-10-CM | POA: Insufficient documentation

## 2020-12-24 DIAGNOSIS — F1910 Other psychoactive substance abuse, uncomplicated: Secondary | ICD-10-CM | POA: Diagnosis not present

## 2020-12-24 DIAGNOSIS — Z20822 Contact with and (suspected) exposure to covid-19: Secondary | ICD-10-CM | POA: Diagnosis not present

## 2020-12-24 DIAGNOSIS — I160 Hypertensive urgency: Secondary | ICD-10-CM | POA: Diagnosis present

## 2020-12-24 LAB — CBC WITH DIFFERENTIAL/PLATELET
Abs Immature Granulocytes: 0.06 10*3/uL (ref 0.00–0.07)
Basophils Absolute: 0 10*3/uL (ref 0.0–0.1)
Basophils Relative: 0 %
Eosinophils Absolute: 0 10*3/uL (ref 0.0–0.5)
Eosinophils Relative: 0 %
HCT: 41.2 % (ref 39.0–52.0)
Hemoglobin: 13.6 g/dL (ref 13.0–17.0)
Immature Granulocytes: 1 %
Lymphocytes Relative: 7 %
Lymphs Abs: 0.4 10*3/uL — ABNORMAL LOW (ref 0.7–4.0)
MCH: 31.4 pg (ref 26.0–34.0)
MCHC: 33 g/dL (ref 30.0–36.0)
MCV: 95.2 fL (ref 80.0–100.0)
Monocytes Absolute: 0.4 10*3/uL (ref 0.1–1.0)
Monocytes Relative: 7 %
Neutro Abs: 4.4 10*3/uL (ref 1.7–7.7)
Neutrophils Relative %: 85 %
Platelets: 160 10*3/uL (ref 150–400)
RBC: 4.33 MIL/uL (ref 4.22–5.81)
RDW: 12.4 % (ref 11.5–15.5)
WBC: 5.2 10*3/uL (ref 4.0–10.5)
nRBC: 0 % (ref 0.0–0.2)

## 2020-12-24 LAB — TSH: TSH: 1.258 u[IU]/mL (ref 0.350–4.500)

## 2020-12-24 LAB — LIPASE, BLOOD: Lipase: 84 U/L — ABNORMAL HIGH (ref 11–51)

## 2020-12-24 LAB — COMPREHENSIVE METABOLIC PANEL
ALT: 30 U/L (ref 0–44)
AST: 70 U/L — ABNORMAL HIGH (ref 15–41)
Albumin: 4.4 g/dL (ref 3.5–5.0)
Alkaline Phosphatase: 54 U/L (ref 38–126)
Anion gap: 11 (ref 5–15)
BUN: 11 mg/dL (ref 6–20)
CO2: 22 mmol/L (ref 22–32)
Calcium: 8.8 mg/dL — ABNORMAL LOW (ref 8.9–10.3)
Chloride: 103 mmol/L (ref 98–111)
Creatinine, Ser: 0.9 mg/dL (ref 0.61–1.24)
GFR, Estimated: >60 mL/min (ref >60)
Glucose, Bld: 202 mg/dL — ABNORMAL HIGH (ref 70–99)
Potassium: 3.9 mmol/L (ref 3.5–5.1)
Sodium: 136 mmol/L (ref 135–145)
Total Bilirubin: 1.1 mg/dL (ref 0.3–1.2)
Total Protein: 7.6 g/dL (ref 6.5–8.1)

## 2020-12-24 LAB — ETHANOL: Alcohol, Ethyl (B): <10 mg/dL (ref <10)

## 2020-12-24 LAB — RESP PANEL BY RT-PCR (FLU A&B, COVID) ARPGX2
Influenza A by PCR: NEGATIVE
Influenza B by PCR: NEGATIVE
SARS Coronavirus 2 by RT PCR: NEGATIVE

## 2020-12-24 LAB — TROPONIN I (HIGH SENSITIVITY)
Troponin I (High Sensitivity): 3 ng/L (ref <18)
Troponin I (High Sensitivity): 6 ng/L (ref <18)

## 2020-12-24 LAB — VITAMIN B12: Vitamin B-12: 241 pg/mL (ref 180–914)

## 2020-12-24 LAB — FOLATE: Folate: 8.6 ng/mL (ref >5.9)

## 2020-12-24 MED ORDER — LACTATED RINGERS IV BOLUS
1000.0000 mL | Freq: Once | INTRAVENOUS | Status: AC
  Administered 2020-12-24: 1000 mL via INTRAVENOUS

## 2020-12-24 MED ORDER — LEVETIRACETAM IN NACL 1000 MG/100ML IV SOLN: 1000.0000 mg | Freq: Once | INTRAVENOUS | Status: AC

## 2020-12-24 MED ORDER — PANTOPRAZOLE SODIUM 40 MG PO TBEC
40.0000 mg | DELAYED_RELEASE_TABLET | Freq: Every day | ORAL | Status: DC
  Administered 2020-12-24 – 2020-12-25 (×2): 40 mg via ORAL
  Filled 2020-12-24 (×2): qty 1

## 2020-12-24 MED ORDER — LEVETIRACETAM IN NACL 1000 MG/100ML IV SOLN
INTRAVENOUS | Status: AC
  Administered 2020-12-24: 1000 mg via INTRAVENOUS
  Filled 2020-12-24: qty 100

## 2020-12-24 MED ORDER — LEVETIRACETAM IN NACL 500 MG/100ML IV SOLN
500.0000 mg | Freq: Two times a day (BID) | INTRAVENOUS | Status: DC
  Administered 2020-12-24: 500 mg via INTRAVENOUS
  Filled 2020-12-24 (×2): qty 100

## 2020-12-24 MED ORDER — IOHEXOL 300 MG/ML  SOLN
100.0000 mL | Freq: Once | INTRAMUSCULAR | Status: AC | PRN
  Administered 2020-12-24: 100 mL via INTRAVENOUS

## 2020-12-24 MED ORDER — ACETAMINOPHEN 650 MG RE SUPP: 650.0000 mg | Freq: Four times a day (QID) | RECTAL | Status: DC | PRN

## 2020-12-24 MED ORDER — DICYCLOMINE HCL 20 MG PO TABS
20.0000 mg | ORAL_TABLET | Freq: Two times a day (BID) | ORAL | Status: DC | PRN
  Filled 2020-12-24: qty 1

## 2020-12-24 MED ORDER — ACETAMINOPHEN 325 MG PO TABS: 650.0000 mg | ORAL_TABLET | Freq: Four times a day (QID) | ORAL | Status: DC | PRN

## 2020-12-24 MED ORDER — ONDANSETRON HCL 4 MG PO TABS: 4.0000 mg | ORAL_TABLET | Freq: Four times a day (QID) | ORAL | Status: DC | PRN

## 2020-12-24 MED ORDER — ADULT MULTIVITAMIN W/MINERALS CH
1.0000 | ORAL_TABLET | Freq: Every day | ORAL | Status: DC
  Administered 2020-12-24 – 2020-12-25 (×2): 1 via ORAL
  Filled 2020-12-24 (×2): qty 1

## 2020-12-24 MED ORDER — ONDANSETRON HCL 4 MG/2ML IJ SOLN: 4.0000 mg | Freq: Four times a day (QID) | INTRAMUSCULAR | Status: DC | PRN

## 2020-12-24 MED ORDER — FOLIC ACID 1 MG PO TABS
1.0000 mg | ORAL_TABLET | Freq: Every day | ORAL | Status: DC
  Administered 2020-12-25: 1 mg via ORAL
  Filled 2020-12-24: qty 1

## 2020-12-24 MED ORDER — AMLODIPINE BESYLATE 5 MG PO TABS
5.0000 mg | ORAL_TABLET | Freq: Every day | ORAL | Status: DC
  Administered 2020-12-25: 5 mg via ORAL
  Filled 2020-12-24: qty 1

## 2020-12-24 MED ORDER — ACETAMINOPHEN 500 MG PO TABS
1000.0000 mg | ORAL_TABLET | Freq: Once | ORAL | Status: DC
  Filled 2020-12-24: qty 2

## 2020-12-24 MED ORDER — ENOXAPARIN SODIUM 40 MG/0.4ML IJ SOSY
40.0000 mg | PREFILLED_SYRINGE | INTRAMUSCULAR | Status: DC
  Administered 2020-12-24: 40 mg via SUBCUTANEOUS
  Filled 2020-12-24: qty 0.4

## 2020-12-24 NOTE — ED Provider Notes (Signed)
[facility] EMERGENCY DEPARTMENT Provider Note   CSN: [id number] Arrival date & time: 12/24/20  1153     History Chief Complaint  Patient presents with   Seizures    Gerald Anderson is a 30 y.o. male with PMH back pain, Kawasaki disease, previous admission for hypertensive emergency and new onset seizures in April 2022.  Patient presents to the emergency department after experiencing a seizure after a syncopal episode of where [place].  Patient is currently not on any antiepileptic medication and arrives complaining of chest pain, back pain, headache and abdominal pain.  Denies fever, nausea, vomiting, diarrhea or other systemic symptoms.  He denies shortness of breath.  He arrives hemodynamically stable saturating 96% on room air with no labored breathing.  He is alert and oriented answering questions appropriately and does not appear to have any secondary signs of seizure including involuntary urination or tongue biting, no postictal state at this time.   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 No pertinent surgical history.     History reviewed. No pertinent family history.  Social History   Tobacco Use   Smoking status: Never   Smokeless tobacco: Never  Substance Use Topics   Alcohol use: Yes    Comment: socially   Drug use: No    Home Medications Prior to Admission medications   Medication Sig Start Date End Date Taking? Authorizing Provider  acetaminophen (TYLENOL) 500 MG tablet Take 1,000 mg by mouth every 6 (six) hours as needed for mild pain (back pain).    [provider]  amLODipine (NORVASC) 5 MG tablet Take 1 tablet (5 mg  total) by mouth daily. 08/31/20   Kathlen Mody, MD  dicyclomine (BENTYL) 20 MG tablet Take 1 tablet (20 mg total) by mouth 2 (two) times daily as needed for up to 16 doses for spasms. 11/28/20   Terald Sleeper, MD  folic acid (FOLVITE) 1 MG tablet Take 1 tablet (1 mg total) by mouth daily. 08/31/20   Kathlen Mody, MD  Menthol, Topical Analgesic, (ICY HOT BACK EX) Apply 1 application topically daily as needed (Back Pain).    [provider]  Multiple Vitamin (MULTIVITAMIN WITH MINERALS) TABS tablet Take 1 tablet by mouth daily. 08/31/20   Kathlen Mody, MD  pantoprazole (PROTONIX) 20 MG tablet Take 1 tablet (20 mg total) by mouth daily. 03/20/18   Arby Barrette, MD  potassium chloride (KLOR-CON) 10 MEQ tablet Take 1 tablet (10 mEq total) by mouth daily for 15 days. 11/28/20 12/13/20  Terald Sleeper, MD  thiamine 100 MG tablet Take 1 tablet (100 mg total) by mouth daily. 08/30/20   Kathlen Mody, MD  vitamin B-12 1000 MCG tablet Take 1 tablet (1,000 mcg total) by mouth daily. 08/31/20   Kathlen Mody, MD    Allergies    Patient has no known allergies.  Review of Systems   Review of Systems  Constitutional:  Positive for chills. Negative for fever.  HENT:  Negative for ear pain and sore throat.   Eyes:  Negative for pain and visual disturbance.  Respiratory:  Negative for cough and shortness of  breath.   Cardiovascular:  Positive for chest pain. Negative for palpitations.  Gastrointestinal:  Positive for abdominal pain. Negative for vomiting.  Genitourinary:  Negative for dysuria and hematuria.  Musculoskeletal:  Positive for back pain. Negative for arthralgias.  Skin:  Negative for color change and rash.  Neurological:  Positive for seizures and syncope.  All other systems reviewed and are negative.  Physical Exam Updated Vital Signs BP (!) 155/99 (BP Location: Left Arm)   Pulse 85   Temp 98.3 F (36.8 C) (Oral)   Resp 17   Ht 6\' 1"  (1.854 m)   Wt 69.4 kg   SpO2 96%   BMI  20.19 kg/m   Physical Exam Vitals and nursing note reviewed.  Constitutional:      Appearance: He is well-developed.  HENT:     Head: Normocephalic and atraumatic.  Eyes:     Conjunctiva/sclera: Conjunctivae normal.  Cardiovascular:     Rate and Rhythm: Normal rate and regular rhythm.     Heart sounds: No murmur heard. Pulmonary:     Effort: Pulmonary effort is normal. No respiratory distress.     Breath sounds: Normal breath sounds.  Abdominal:     Palpations: Abdomen is soft.     Tenderness: There is abdominal tenderness (RLQ).  Musculoskeletal:     Cervical back: Neck supple.  Skin:    General: Skin is warm and dry.  Neurological:     General: No focal deficit present.     Mental Status: He is alert. Mental status is at baseline.     Cranial Nerves: No cranial nerve deficit.     Sensory: No sensory deficit.     Motor: No weakness.    ED Results / Procedures / Treatments   Labs (all labs ordered are listed, but only abnormal results are displayed) Labs Reviewed  COMPREHENSIVE METABOLIC PANEL  CBC WITH DIFFERENTIAL/PLATELET  LIPASE, BLOOD  ETHANOL  RAPID URINE DRUG SCREEN, HOSP PERFORMED  TROPONIN I (HIGH SENSITIVITY)    EKG None  Radiology No results found.  Procedures Procedures   Medications Ordered in ED Medications - No data to display  ED Course  I have reviewed the triage vital signs and the nursing notes.  Pertinent labs & imaging results that were available during my care of the patient were reviewed by me and considered in my medical decision making (see chart for details).    MDM Rules/Calculators/A&P                           Patient seen in the emergency department for evaluation of a seizure with head strike.  Physical exam reveals an overall well-appearing patient with mild tenderness over the left frontal bone but no external signs of trauma.  Physical exam also reveal significant right lower quadrant tenderness which was unexpected.   No focal motor or sensory deficits, no cranial nerve deficits.  Laboratory evaluation reveals a mildly elevated lipase to 84, mild transaminitis with an AST of 70 but is otherwise unremarkable.  CT of the head, C-spine, abdomen pelvis is pending but there does not appear to be an acute bleed on my wet read.  We will follow-up formal radiology interpretation.  While in x-ray, the patient had an additional general tonic-clonic seizure lasting less than 30 seconds that self resolved, but patient did desaturate into the low 80s during ths.  On reexamination, the patient is postictal and diaphoretic, placed on 2 L nasal cannula.  Loaded with 1 g Keppra.  As the patient was previously admitted for hypertensive emergency and a new onset seizure behavior, and now is having persistent seizures, the patient would benefit from inpatient admission, possible EEG monitoring and inpatient neurologic evaluation.  Hospitalist was consulted and patient was admitted.  Final Clinical Impression(s) / ED Diagnoses Final diagnoses:  None    Rx / DC Orders ED Discharge Orders     None        Dalinda Heidt, Wyn Forster, MD 12/24/20 1354

## 2020-12-24 NOTE — ED Triage Notes (Signed)
Complaining of chest pain and chills.

## 2020-12-24 NOTE — H&P (Signed)
History and Physical  Gerald Anderson WNI:[id number] DOB: 01/01/1991 DOA: 12/24/2020   PCP: Patient, No Pcp Per (Inactive)   Patient coming from: Home  Chief Complaint: seizure/syncope  HPI:  Gerald Anderson is a 30 y.o. male with medical history of seizure disorder, hypertension, Kawasaki disease presenting with a syncopal episode while he was [place] court [place] on the morning of 12/24/2020.  Apparently, had a witnessed tonic-clonic episode after which she had syncope and hit his head against the wall.  The next thing the patient remembers was being in the ambulance.  Notably, the patient had a hospital admission from 08/29/2020 to 08/30/2020 for hypertensive crisis and a witnessed tonic-clonic seizure.  The patient was seen by neurology at that time.  EEG was normal.  Although he was loaded with Keppra at that time, he was not discharged with any antiepileptic medications after evaluation by neurology.  The patient was started on amlodipine during that hospitalization.  His MRI of the brain was unremarkable.  The patient was instructed to follow-up with outpatient neurology but he has not done so. Nevertheless, the patient states that he smokes marijuana and drinks alcohol regularly.  He states that he drinks Cognac, about 1 pint, 4 to 5 times per week.  His last drink was on the evening of 12/23/20.  The patient himself states that he had been in his usual state of health prior to today's episode.  He denies any recent fevers, chills, chest pain, shortness breath, nausea, vomiting, diarrhea.  He describes some upper abdominal pain which started yesterday.  He also had 1 episode of emesis on 12/23/2020 without any blood.  There is no hematochezia or melena.  He denies any dysuria or hematuria.  He complained of a bifrontal headache without any visual disturbance or focal extremity weakness. While in radiology, the patient had a witnessed tonic-clonic episode lasting 30 seconds.  He desaturated into the 80%  range.  He was placed on 2 L nasal cannula. In the emergency department, the patient was afebrile hemodynamically stable with oxygen saturation 96% room air.  BMP was unremarkable with sodium 136, potassium 3.9, serum creatinine 0.90.  AST 70, ALT 30, alk phosphatase 54, total bilirubin 1.1, lipase 84.  WBC 5.2, hemoglobin 13.6, platelets 160,000.  CT of the abdomen and pelvis was negative for any acute findings.  A CT of the brain was negative.  The patient was loaded with Keppra.  Assessment/Plan: Seizure/syncope -Continue IV Keppra -EEG -Neurology consult  Polysubstance abuse -Including alcohol and THC -Alcohol withdrawal protocol  Essential hypertension -Continue amlodipine  Transaminasemia -Secondary to alcohol usage -Check a.m. LFTs -RUQ ultrasound -hep B surface antigen -hep C antibody  Hyperglycemia -Check hemoglobin A1c       Past Medical History:  Diagnosis Date   Back pain    Kawasaki disease ([place])    History reviewed. No pertinent surgical history. Social History:  reports that he has never smoked. He has never used smokeless tobacco. He reports current alcohol use. He reports that he does not use drugs.   History reviewed. No pertinent family history.   No Known Allergies   Prior to Admission medications   Medication Sig Start Date End Date Taking? Authorizing Provider  acetaminophen (TYLENOL) 500 MG tablet Take 1,000 mg by mouth every 6 (six) hours as needed for mild pain (back pain).    [provider]  amLODipine (NORVASC) 5 MG tablet Take 1 tablet (5 mg total) by mouth daily. 08/31/20  Hosie Poisson, MD  dicyclomine (BENTYL) 20 MG tablet Take 1 tablet (20 mg total) by mouth 2 (two) times daily as needed for up to 16 doses for spasms. 11/28/20   Wyvonnia Dusky, MD  folic acid (FOLVITE) 1 MG tablet Take 1 tablet (1 mg total) by mouth daily. 08/31/20   Hosie Poisson, MD  Menthol, Topical Analgesic, (ICY HOT BACK EX) Apply 1 application  topically daily as needed (Back Pain).    [provider]  Multiple Vitamin (MULTIVITAMIN WITH MINERALS) TABS tablet Take 1 tablet by mouth daily. 08/31/20   Hosie Poisson, MD  pantoprazole (PROTONIX) 20 MG tablet Take 1 tablet (20 mg total) by mouth daily. 03/20/18   Charlesetta Shanks, MD  potassium chloride (KLOR-CON) 10 MEQ tablet Take 1 tablet (10 mEq total) by mouth daily for 15 days. 11/28/20 12/13/20  Wyvonnia Dusky, MD  thiamine 100 MG tablet Take 1 tablet (100 mg total) by mouth daily. 08/30/20   Hosie Poisson, MD  vitamin B-12 1000 MCG tablet Take 1 tablet (1,000 mcg total) by mouth daily. 08/31/20   Hosie Poisson, MD    Review of Systems:  Constitutional:  No weight loss, night sweats, Fevers, chills, fatigue.  Head&Eyes: No headache.  No vision loss.  No eye pain or scotoma ENT:  No Difficulty swallowing,Tooth/dental problems,Sore throat,  No ear ache, post nasal drip,  Cardio-vascular:  No chest pain, Orthopnea, PND, swelling in lower extremities,  dizziness, palpitations  GI:  No  abdominal pain, nausea, vomiting, diarrhea, loss of appetite, hematochezia, melena, heartburn, indigestion, Resp:  No shortness of breath with exertion or at rest. No cough. No coughing up of blood .No wheezing.No chest wall deformity  Skin:  no rash or lesions.  GU:  no dysuria, change in color of urine, no urgency or frequency. No flank pain.  Musculoskeletal:  No joint pain or swelling. No decreased range of motion. No back pain.  Psych:  No change in mood or affect. No depression or anxiety. Neurologic: No headache, no dysesthesia, no focal weakness, no vision loss. No syncope  Physical Exam: Vitals:   12/24/20 1336 12/24/20 1345 12/24/20 1400 12/24/20 1415  BP: (!) 185/82  (!) 133/102   Pulse: (!) 112 (!) 107 (!) 101 92  Resp: 20 (!) 21 18 (!) 21  Temp:      TempSrc:      SpO2: 91% 98% 96% 100%  Weight:      Height:       General:  A&O x 3, NAD, nontoxic,  pleasant/cooperative Head/Eye: No conjunctival hemorrhage, no icterus, [place]/AT, No nystagmus ENT:  No icterus,  No thrush, good dentition, no pharyngeal exudate Neck:  No masses, no lymphadenpathy, no bruits CV:  RRR, no rub, no gallop, no S3 Lung: Bibasilar rales.  No wheezing.  Good air movement. Abdomen: soft/NT, +BS, nondistended, no peritoneal signs Ext: No cyanosis, No rashes, No petechiae, No lymphangitis, No edema Neuro: CNII-XII intact, strength 4/5 in bilateral upper and lower extremities, no dysmetria  Labs on Admission:  Basic Metabolic Panel: Recent Labs  Lab 12/24/20 1205  NA 136  K 3.9  CL 103  CO2 22  GLUCOSE 202*  BUN 11  CREATININE 0.90  CALCIUM 8.8*   Liver Function Tests: Recent Labs  Lab 12/24/20 1205  AST 70*  ALT 30  ALKPHOS 54  BILITOT 1.1  PROT 7.6  ALBUMIN 4.4   Recent Labs  Lab 12/24/20 1205  LIPASE 84*   No results for input(s):  AMMONIA in the last 168 hours. CBC: Recent Labs  Lab 12/24/20 1205  WBC 5.2  NEUTROABS 4.4  HGB 13.6  HCT 41.2  MCV 95.2  PLT 160   Coagulation Profile: No results for input(s): INR, PROTIME in the last 168 hours. Cardiac Enzymes: No results for input(s): CKTOTAL, CKMB, CKMBINDEX, TROPONINI in the last 168 hours. BNP: Invalid input(s): POCBNP CBG: No results for input(s): GLUCAP in the last 168 hours. Urine analysis:    Component Value Date/Time   COLORURINE YELLOW 03/20/2018 [place] 03/20/2018 1435   LABSPEC 1.019 03/20/2018 1435   PHURINE 5.0 03/20/2018 1435   GLUCOSEU NEGATIVE 03/20/2018 1435   HGBUR NEGATIVE 03/20/2018 1435   BILIRUBINUR NEGATIVE 03/20/2018 1435   KETONESUR 20 (A) 03/20/2018 1435   PROTEINUR NEGATIVE 03/20/2018 1435   UROBILINOGEN 0.2 07/19/2014 0720   NITRITE NEGATIVE 03/20/2018 1435   LEUKOCYTESUR NEGATIVE 03/20/2018 1435   Sepsis Labs: _0 (procalcitonin:4,lacticidven:4) )No results found for this or any previous visit (from the past 240  hour(s)).   Radiological Exams on Admission: DG Chest 2 View  Result Date: 12/24/2020 CLINICAL DATA:  chest pain EXAM: CHEST - 2 VIEW COMPARISON:  None. FINDINGS: The heart size and mediastinal contours are within normal limits. Both lungs are clear. The visualized skeletal structures are unremarkable. IMPRESSION: No evidence of acute cardiopulmonary disease. Electronically Signed   By: Maurine Simmering   On: 12/24/2020 14:05   CT Head Wo Contrast  Result Date: 12/24/2020 CLINICAL DATA:  Head trauma, mod-severe seizure post head trauma EXAM: CT HEAD WITHOUT CONTRAST TECHNIQUE: Contiguous axial images were obtained from the base of the skull through the vertex without intravenous contrast. COMPARISON:  Head CT 08/29/2020 FINDINGS: Brain: No evidence of acute infarction, hemorrhage, hydrocephalus, extra-axial collection or mass lesion/mass effect.The ventricles are normal in size. Vascular: No hyperdense vessel or unexpected calcification. Skull: Normal. Negative for fracture or focal lesion. Sinuses/Orbits: Minimal paranasal sinus mucosal thickening. Other: None. IMPRESSION: No acute intracranial abnormality. Electronically Signed   By: Maurine Simmering   On: 12/24/2020 14:08   CT ABDOMEN PELVIS W CONTRAST  Result Date: 12/24/2020 CLINICAL DATA:  RLQ abdominal pain r/o appy EXAM: CT ABDOMEN AND PELVIS WITH CONTRAST TECHNIQUE: Multidetector CT imaging of the abdomen and pelvis was performed using the standard protocol following bolus administration of intravenous contrast. CONTRAST:  156m OMNIPAQUE IOHEXOL 300 MG/ML  SOLN COMPARISON:  None. FINDINGS: Lower chest: No acute abnormality. Hepatobiliary: No focal liver abnormality is seen. No gallstones, gallbladder wall thickening, or biliary dilatation. Pancreas: Unremarkable. No pancreatic ductal dilatation or surrounding inflammatory changes. Spleen: Normal in size without focal abnormality. Adrenals/Urinary Tract: Adrenal glands are unremarkable. Kidneys are normal,  without renal calculi, focal lesion, or hydronephrosis. Bladder is unremarkable. Stomach/Bowel: Stomach is within normal limits. Appendix appears normal. No evidence of bowel wall thickening, distention, or inflammatory changes. Vascular/Lymphatic: No significant vascular findings are present. No enlarged abdominal or pelvic lymph nodes. Reproductive: Unremarkable. Other: No abdominal wall hernia or abnormality. No abdominopelvic ascites. Musculoskeletal: No acute osseous abnormality. Anterior superior limbus vertebrae of S1. IMPRESSION: No acute abdominopelvic abnormality.  Normal appendix. Electronically Signed   By: JMaurine Simmering  On: 12/24/2020 14:14    EKG: Independently reviewed. Sinus, nonspecific T wave change    Time spent:60 minutes Code Status:   FULL Family Communication:  No Family at bedside Disposition Plan: expect 1-2 day hospitalization Consults called: neurology DVT Prophylaxis: [place] Lovenox  DOrson Eva DO  [facility] Pager [phone]  If 7PM-7AM, please contact night-coverage [email or web address] Password [facility] 12/24/2020, 2:37 PM

## 2020-12-24 NOTE — ED Triage Notes (Signed)
[place] to [place] ground hitting head on wall having reported seizure like activity. Patient reports headache. Does not recall events surrounding incident nor doe he have a hx of seizures. Noted patient to be shaking.

## 2020-12-24 NOTE — Consult Note (Signed)
[facility] Reginaldo Hazard A. Gerilyn Pilgrim, MD     [email or web address]          Gerald Anderson is an 30 y.o. male.   ASSESSMENT/PLAN: CHART REVIEW. PT IN MRI. CASE DISCUSSED W DR TAT   Blood pressure (!) 133/102, pulse 92, temperature 98.3 F (36.8 C), temperature source Oral, resp. rate (!) 21, height 6\' 1"  (1.854 m), weight 69.4 kg, SpO2 100 %.  Past Medical History:  Diagnosis Date   Back pain    Kawasaki disease ([facility])     History reviewed. No pertinent surgical history.  History reviewed. No pertinent family history.  Social History:  reports that he has never smoked. He has never used smokeless tobacco. He reports current alcohol use. He reports that he does not use drugs.  Allergies: No Known Allergies  Medications: Prior to Admission medications   Medication Sig Start Date End Date Taking? Authorizing Provider  Menthol, Topical Analgesic, (ICY HOT BACK EX) Apply 1 application topically daily as needed (Back Pain).   Yes [provider]  acetaminophen (TYLENOL) 500 MG tablet Take 1,000 mg by mouth every 6 (six) hours as needed for mild pain (back pain). Patient not taking: Reported on 12/24/2020    [provider]  amLODipine (NORVASC) 5 MG tablet Take 1 tablet (5 mg total) by mouth daily. Patient not taking: No sig reported 08/31/20   10/31/20, MD  dicyclomine (BENTYL) 20 MG tablet Take 1 tablet (20 mg total) by mouth 2 (two) times daily as needed for up to 16 doses for spasms. Patient not taking: No sig reported 11/28/20   01/29/21, MD  folic acid (FOLVITE) 1 MG tablet Take 1 tablet (1 mg total) by mouth daily. Patient not taking: No sig reported 08/31/20   10/31/20, MD  Multiple Vitamin (MULTIVITAMIN WITH MINERALS) TABS tablet Take 1 tablet by mouth daily. Patient not taking: No sig reported 08/31/20   10/31/20, MD  pantoprazole (PROTONIX) 20 MG tablet Take 1 tablet (20 mg total) by mouth daily. Patient not taking: No sig reported  03/20/18   03/22/18, MD  potassium chloride (KLOR-CON) 10 MEQ tablet Take 1 tablet (10 mEq total) by mouth daily for 15 days. Patient not taking: Reported on 12/24/2020 11/28/20 12/13/20  12/15/20, MD  thiamine 100 MG tablet Take 1 tablet (100 mg total) by mouth daily. Patient not taking: No sig reported 08/30/20   10/30/20, MD  vitamin B-12 1000 MCG tablet Take 1 tablet (1,000 mcg total) by mouth daily. Patient not taking: No sig reported 08/31/20   10/31/20, MD    Scheduled Meds:  acetaminophen  1,000 mg Oral Once   Continuous Infusions: PRN Meds:.     Results for orders placed or performed during the hospital encounter of 12/24/20 (from the past 48 hour(s))  Comprehensive metabolic panel     Status: Abnormal   Collection Time: 12/24/20 12:05 PM  Result Value Ref Range   Sodium 136 135 - 145 mmol/L   Potassium 3.9 3.5 - 5.1 mmol/L   Chloride 103 98 - 111 mmol/L   CO2 22 22 - 32 mmol/L   Glucose, Bld 202 (H) 70 - 99 mg/dL    Comment: Glucose reference range applies only to samples taken after fasting for at least 8 hours.   BUN 11 6 - 20 mg/dL   Creatinine, Ser 02/23/21 0.61 - 1.24 mg/dL   Calcium 8.8 (L) 8.9 - 10.3 mg/dL   Total Protein 7.6  6.5 - 8.1 g/dL   Albumin 4.4 3.5 - 5.0 g/dL   AST 70 (H) 15 - 41 U/L   ALT 30 0 - 44 U/L   Alkaline Phosphatase 54 38 - 126 U/L   Total Bilirubin 1.1 0.3 - 1.2 mg/dL   GFR, Estimated >40>60 >98>60 mL/min    Comment: (NOTE) Calculated using the CKD-EPI Creatinine Equation (2021)    Anion gap 11 5 - 15    Comment: Performed at [facility], [address] [place], [place] [address]  Troponin I (High Sensitivity)     Status: None   Collection Time: 12/24/20 12:05 PM  Result Value Ref Range   Troponin I (High Sensitivity) 3 <18 ng/L    Comment: (NOTE) Elevated high sensitivity troponin I (hsTnI) values and significant  changes across serial measurements may suggest ACS but many other  chronic and acute conditions are known to  elevate hsTnI results.  Refer to the "Links" section for chest pain algorithms and additional  guidance. Performed at [facility], [address] [place], [place] [address]   CBC with Differential     Status: Abnormal   Collection Time: 12/24/20 12:05 PM  Result Value Ref Range   WBC 5.2 4.0 - 10.5 K/uL   RBC 4.33 4.22 - 5.81 MIL/uL   Hemoglobin 13.6 13.0 - 17.0 g/dL   HCT 62.141.2 30.839.0 - 65.752.0 %   MCV 95.2 80.0 - 100.0 fL   MCH 31.4 26.0 - 34.0 pg   MCHC 33.0 30.0 - 36.0 g/dL   RDW 84.612.4 96.211.5 - 95.215.5 %   Platelets 160 150 - 400 K/uL   nRBC 0.0 0.0 - 0.2 %   Neutrophils Relative % 85 %   Neutro Abs 4.4 1.7 - 7.7 K/uL   Lymphocytes Relative 7 %   Lymphs Abs 0.4 (L) 0.7 - 4.0 K/uL   Monocytes Relative 7 %   Monocytes Absolute 0.4 0.1 - 1.0 K/uL   Eosinophils Relative 0 %   Eosinophils Absolute 0.0 0.0 - 0.5 K/uL   Basophils Relative 0 %   Basophils Absolute 0.0 0.0 - 0.1 K/uL   Immature Granulocytes 1 %   Abs Immature Granulocytes 0.06 0.00 - 0.07 K/uL    Comment: Performed at [facility], [address] [place], [place] [address]  Lipase, blood     Status: Abnormal   Collection Time: 12/24/20 12:05 PM  Result Value Ref Range   Lipase 84 (H) 11 - 51 U/L    Comment: Performed at [facility], [address] [place], [place] [address]  Ethanol     Status: None   Collection Time: 12/24/20 12:07 PM  Result Value Ref Range   Alcohol, Ethyl (B) <10 <10 mg/dL    Comment: (NOTE) Lowest detectable limit for serum alcohol is 10 mg/dL.  For medical purposes only. Performed at [facility], [address] [place], [place] [address]   Troponin I (High Sensitivity)     Status: None   Collection Time: 12/24/20  2:05 PM  Result Value Ref Range   Troponin I (High Sensitivity) 6 <18 ng/L    Comment: (NOTE) Elevated high sensitivity troponin I (hsTnI) values and significant  changes across serial measurements may suggest ACS but many other  chronic and acute conditions are known to elevate  hsTnI results.  Refer to the "Links" section for chest pain algorithms and additional  guidance. Performed at [facility], [address] [place], [place] [address]   Resp Panel by RT-PCR (  Flu A&B, Covid) Nasopharyngeal Swab     Status: None   Collection Time: 12/24/20  2:06 PM   Specimen: Nasopharyngeal Swab; Nasopharyngeal(NP) swabs in vial transport medium  Result Value Ref Range   SARS Coronavirus 2 by RT PCR NEGATIVE NEGATIVE    Comment: (NOTE) SARS-CoV-2 target nucleic acids are NOT DETECTED.  The SARS-CoV-2 RNA is generally detectable in upper respiratory specimens during the acute phase of infection. The lowest concentration of SARS-CoV-2 viral copies this assay can detect is 138 copies/mL. A negative result does not preclude SARS-Cov-2 infection and should not be used as the sole basis for treatment or other patient management decisions. A negative result may occur with  improper specimen collection/handling, submission of specimen other than nasopharyngeal swab, presence of viral mutation(s) within the areas targeted by this assay, and inadequate number of viral copies(<138 copies/mL). A negative result must be combined with clinical observations, patient history, and epidemiological information. The expected result is Negative.  Fact Sheet for Patients:  BloggerCourse.com  Fact Sheet for Healthcare Providers:  SeriousBroker.it  This test is no t yet approved or cleared by the [place] FDA and  has been authorized for detection and/or diagnosis of SARS-CoV-2 by FDA under an Emergency Use Authorization (EUA). This EUA will remain  in effect (meaning this test can be used) for the duration of the COVID-19 declaration under Section 564(b)(1) of the Act, 21 U.S.C.section 360bbb-3(b)(1), unless the authorization is terminated  or revoked sooner.       Influenza A by PCR NEGATIVE NEGATIVE   Influenza B by PCR  NEGATIVE NEGATIVE    Comment: (NOTE) The Xpert Xpress SARS-CoV-2/FLU/RSV plus assay is intended as an aid in the diagnosis of influenza from Nasopharyngeal swab specimens and should not be used as a sole basis for treatment. Nasal washings and aspirates are unacceptable for Xpert Xpress SARS-CoV-2/FLU/RSV testing.  Fact Sheet for Patients: BloggerCourse.com  Fact Sheet for Healthcare Providers: SeriousBroker.it  This test is not yet approved or cleared by the [place] FDA and has been authorized for detection and/or diagnosis of SARS-CoV-2 by FDA under an Emergency Use Authorization (EUA). This EUA will remain in effect (meaning this test can be used) for the duration of the COVID-19 declaration under Section 564(b)(1) of the Act, 21 U.S.C. section 360bbb-3(b)(1), unless the authorization is terminated or revoked.  Performed at [facility], [address], [place], [place] [address]     Studies/Results:  EEG [phone]  This is a normal waking and sleep study.  There are no clear focal or epileptiform abnormalities.    BRAIN MRI 08-2020 FINDINGS: Intermittently this exam is moderately degraded by motion despite repeated imaging attempts. T2 and FLAIR axial imaging, precontrast T1 imaging are especially degraded.   Brain: No restricted diffusion to suggest acute infarction. No midline shift, mass effect, evidence of mass lesion, ventriculomegaly, extra-axial collection or acute intracranial hemorrhage. Cervicomedullary junction and pituitary are within normal limits.   Thin slice coronal T2 and FLAIR imaging is vastly superior to the axial sequences today. On those images the hippocampal formations and mesial temporal lobe structures appear symmetric and within normal limits.   Minor nonspecific frontal lobe subcortical white matter changes are identified on the coronal FLAIR. Right corona radiata developmental venous  anomaly (normal variant). No chronic cerebral blood products identified. No encephalomalacia is evident. No abnormal enhancement identified. No dural thickening.   Vascular: Major intracranial vascular flow voids grossly preserved. Better vascular detail on postcontrast T1 weighted axial images where  the major dural venous sinuses are enhancing and appear to be patent.   Skull and upper cervical spine: Grossly negative.   Sinuses/Orbits: Negative orbits. Mild paranasal sinus mucosal thickening.   Other: Visible internal auditory structures appear normal. Mild left mastoid air cell fluid. Negative visible scalp and face.   IMPRESSION: 1. Some portions of this exam are moderately degraded despite repeated imaging attempts. 2. No acute intracranial abnormality is evident. Largely negative MRI appearance of the brain.         Ayron Fillinger A. Gerilyn Pilgrim, M.D.  Diplomate, Biomedical engineer of Psychiatry and Neurology ( Neurology). 12/24/2020, 4:54 PM

## 2020-12-24 NOTE — ED Notes (Signed)
MD in room to see patient at time of triage to complete MSE.

## 2020-12-25 ENCOUNTER — Observation Stay (HOSPITAL_COMMUNITY)
Admit: 2020-12-25 | Discharge: 2020-12-25 | Disposition: A | Discharge status: Home / Self Care | Payer: No Typology Code available for payment source | Attending: Neurology | Admitting: Neurology

## 2020-12-25 ENCOUNTER — Observation Stay (HOSPITAL_COMMUNITY): Payer: No Typology Code available for payment source

## 2020-12-25 DIAGNOSIS — R7401 Elevation of levels of liver transaminase levels: Secondary | ICD-10-CM | POA: Diagnosis not present

## 2020-12-25 DIAGNOSIS — I160 Hypertensive urgency: Secondary | ICD-10-CM | POA: Diagnosis not present

## 2020-12-25 DIAGNOSIS — G40909 Epilepsy, unspecified, not intractable, without status epilepticus: Secondary | ICD-10-CM | POA: Diagnosis not present

## 2020-12-25 LAB — COMPREHENSIVE METABOLIC PANEL
ALT: 24 U/L (ref 0–44)
AST: 46 U/L — ABNORMAL HIGH (ref 15–41)
Albumin: 3.8 g/dL (ref 3.5–5.0)
Alkaline Phosphatase: 50 U/L (ref 38–126)
Anion gap: 8 (ref 5–15)
BUN: 8 mg/dL (ref 6–20)
CO2: 27 mmol/L (ref 22–32)
Calcium: 8.6 mg/dL — ABNORMAL LOW (ref 8.9–10.3)
Chloride: 103 mmol/L (ref 98–111)
Creatinine, Ser: 0.94 mg/dL (ref 0.61–1.24)
GFR, Estimated: >60 mL/min (ref >60)
Glucose, Bld: 88 mg/dL (ref 70–99)
Potassium: 3 mmol/L — ABNORMAL LOW (ref 3.5–5.1)
Sodium: 138 mmol/L (ref 135–145)
Total Bilirubin: 1.6 mg/dL — ABNORMAL HIGH (ref 0.3–1.2)
Total Protein: 6.7 g/dL (ref 6.5–8.1)

## 2020-12-25 LAB — CBC
HCT: 39.5 % (ref 39.0–52.0)
Hemoglobin: 12.9 g/dL — ABNORMAL LOW (ref 13.0–17.0)
MCH: 31.6 pg (ref 26.0–34.0)
MCHC: 32.7 g/dL (ref 30.0–36.0)
MCV: 96.8 fL (ref 80.0–100.0)
Platelets: 155 10*3/uL (ref 150–400)
RBC: 4.08 MIL/uL — ABNORMAL LOW (ref 4.22–5.81)
RDW: 12.7 % (ref 11.5–15.5)
WBC: 7.4 10*3/uL (ref 4.0–10.5)
nRBC: 0 % (ref 0.0–0.2)

## 2020-12-25 LAB — HEMOGLOBIN A1C
Hgb A1c MFr Bld: 5 % (ref 4.8–5.6)
Mean Plasma Glucose: 96.8 mg/dL

## 2020-12-25 LAB — HEPATITIS B SURFACE ANTIGEN: Hepatitis B Surface Ag: NONREACTIVE

## 2020-12-25 LAB — RAPID URINE DRUG SCREEN, HOSP PERFORMED
Amphetamines: NOT DETECTED
Barbiturates: NOT DETECTED
Benzodiazepines: NOT DETECTED
Cocaine: NOT DETECTED
Opiates: NOT DETECTED
Tetrahydrocannabinol: POSITIVE — AB

## 2020-12-25 LAB — HEPATITIS C ANTIBODY: HCV Ab: NONREACTIVE

## 2020-12-25 MED ORDER — LEVETIRACETAM 500 MG PO TABS
500.0000 mg | ORAL_TABLET | Freq: Two times a day (BID) | ORAL | Status: DC
  Administered 2020-12-25: 500 mg via ORAL

## 2020-12-25 MED ORDER — DICYCLOMINE HCL 10 MG PO CAPS: 20.0000 mg | ORAL_CAPSULE | Freq: Two times a day (BID) | ORAL | Status: DC | PRN

## 2020-12-25 MED ORDER — LEVETIRACETAM 500 MG PO TABS
500.0000 mg | ORAL_TABLET | Freq: Two times a day (BID) | ORAL | 1 refills | Status: DC
Start: 2020-12-25 — End: 2021-08-08

## 2020-12-25 MED ORDER — POTASSIUM CHLORIDE CRYS ER 20 MEQ PO TBCR
40.0000 meq | EXTENDED_RELEASE_TABLET | Freq: Once | ORAL | Status: AC
  Administered 2020-12-25: 40 meq via ORAL
  Filled 2020-12-25: qty 2

## 2020-12-25 NOTE — Progress Notes (Signed)
Discharge instructions reviewed with pt. Pt verbalized understanding. PIV Right wrist removed intact, w/o S&S of complications. Printed prescription given to pt for seizure medication. VSS. A&O. Pt belongings sent with pt. Pt provided a transportation waiver for transport back to [facility] with a voucher from [place] transportation services with [employer] transportation.Pt discharged in stable condition.

## 2020-12-25 NOTE — Progress Notes (Signed)
EEG complete - results pending 

## 2020-12-25 NOTE — Discharge Summary (Addendum)
Physician Discharge Summary  Gerald Anderson KZL:[id number] DOB: 08/24/1990 DOA: 12/24/2020  PCP: Patient, No Pcp Per (Inactive)  Admit date: 12/24/2020 Discharge date: 12/25/2020  Admitted From: Home Disposition:  Home   Recommendations for Outpatient Follow-up:  Follow up with PCP in 1-2 weeks Please obtain BMP/CBC in one week     Discharge Condition: Stable CODE STATUS: FULL Diet recommendation: Regular   Brief/Interim Summary: 30 y.o. male with medical history of seizure disorder, hypertension, Kawasaki disease presenting with a syncopal episode [place] court [place] on the morning of 12/24/2020.  Apparently, had a witnessed tonic-clonic episode after which she had syncope and hit his head against the wall.  The next thing the patient remembers was being in the ambulance.  Notably, the patient had a hospital admission from 08/29/2020 to 08/30/2020 for hypertensive crisis and a witnessed tonic-clonic seizure.  The patient was seen by neurology at that time.  EEG was normal.  Although he was loaded with Keppra at that time, he was not discharged with any antiepileptic medications after evaluation by neurology.  The patient was started on amlodipine during that hospitalization.  His MRI of the brain was unremarkable.  The patient was instructed to follow-up with outpatient neurology but he has not done so. Nevertheless, the patient states that he smokes marijuana and drinks alcohol regularly.  He states that he drinks Cognac, about 1 pint, 4 to 5 times per week.  His last drink was on the evening of 12/23/20.  The patient himself states that he had been in his usual state of health prior to today's episode.  He denies any recent fevers, chills, chest pain, shortness breath, nausea, vomiting, diarrhea.  He describes some upper abdominal pain which started yesterday.  He also had 1 episode of emesis on 12/23/2020 without any blood.  There is no hematochezia or melena.  He denies any dysuria or  hematuria.  He complained of a bifrontal headache without any visual disturbance or focal extremity weakness. While in radiology, the patient had a witnessed tonic-clonic episode lasting 30 seconds.  He desaturated into the 80% range.  He was placed on 2 L nasal cannula. In the emergency department, the patient was afebrile hemodynamically stable with oxygen saturation 96% room air.  BMP was unremarkable with sodium 136, potassium 3.9, serum creatinine 0.90.  AST 70, ALT 30, alk phosphatase 54, total bilirubin 1.1, lipase 84.  WBC 5.2, hemoglobin 13.6, platelets 160,000.  CT of the abdomen and pelvis was negative for any acute findings.  A CT of the brain was negative.  The patient was loaded with Keppra. His mental status continued to improve and he returned to baseline.  He did not have any further seizures.   EEG was neg for seizure, but pt gives hx that he had seizure when he was living in [place] 10 years ago.  In addition to his recent, episodes, case was discussed with neurology who felt patient needed to be discharged with keppra 500 mg bid.  Dr. Merlene Laughter will arrange follow up in 2 weeks.  Discharge Diagnoses:   Seizure/syncope -Continue IV Keppra -EEG--neg for seizure -Neurology consult appreciated -case discussed with Dr. Latricia Heft to d/c with keppra 500 mg bid -discussed with patient that he should not drive or operate heavy machinery for at least 6 months or until which time he is cleared by neurology   Polysubstance abuse -Including alcohol and THC -Alcohol withdrawal protocol   Essential hypertension -Continue amlodipine   Transaminasemia -Secondary to  alcohol usage -Check a.m. LFTs--improving -RUQ ultrasound--hepatic steatosis -hep B surface antigen--neg -hep C antibody--neg   Hyperglycemia -Check hemoglobin A1c--5.0    Discharge Instructions   Allergies as of 12/25/2020   No Known Allergies      Medication List     STOP taking these medications     acetaminophen 500 MG tablet Commonly known as: TYLENOL   cyanocobalamin 3435 MCG tablet   folic acid 1 MG tablet Commonly known as: FOLVITE   multivitamin with minerals Tabs tablet   pantoprazole 20 MG tablet Commonly known as: PROTONIX   potassium chloride 10 MEQ tablet Commonly known as: KLOR-CON   thiamine 100 MG tablet       TAKE these medications    amLODipine 5 MG tablet Commonly known as: NORVASC Take 1 tablet (5 mg total) by mouth daily.   dicyclomine 20 MG tablet Commonly known as: BENTYL Take 1 tablet (20 mg total) by mouth 2 (two) times daily as needed for up to 16 doses for spasms.   ICY HOT BACK EX Apply 1 application topically daily as needed (Back Pain).   levETIRAcetam 500 MG tablet Commonly known as: KEPPRA Take 1 tablet (500 mg total) by mouth 2 (two) times daily.        No Known Allergies  Consultations: neurology   Procedures/Studies: DG Chest 2 View  Result Date: 12/24/2020 CLINICAL DATA:  chest pain EXAM: CHEST - 2 VIEW COMPARISON:  None. FINDINGS: The heart size and mediastinal contours are within normal limits. Both lungs are clear. The visualized skeletal structures are unremarkable. IMPRESSION: No evidence of acute cardiopulmonary disease. Electronically Signed   By: Maurine Simmering   On: 12/24/2020 14:05   CT Head Wo Contrast  Result Date: 12/24/2020 CLINICAL DATA:  Head trauma, mod-severe seizure post head trauma EXAM: CT HEAD WITHOUT CONTRAST TECHNIQUE: Contiguous axial images were obtained from the base of the skull through the vertex without intravenous contrast. COMPARISON:  Head CT 08/29/2020 FINDINGS: Brain: No evidence of acute infarction, hemorrhage, hydrocephalus, extra-axial collection or mass lesion/mass effect.The ventricles are normal in size. Vascular: No hyperdense vessel or unexpected calcification. Skull: Normal. Negative for fracture or focal lesion. Sinuses/Orbits: Minimal paranasal sinus mucosal thickening. Other:  None. IMPRESSION: No acute intracranial abnormality. Electronically Signed   By: Maurine Simmering   On: 12/24/2020 14:08   CT CERVICAL SPINE WO CONTRAST  Result Date: 12/24/2020 CLINICAL DATA:  Neck trauma, intoxicated or obtunded (Age >= 16y) EXAM: CT CERVICAL SPINE WITHOUT CONTRAST TECHNIQUE: Multidetector CT imaging of the cervical spine was performed without intravenous contrast. Multiplanar CT image reconstructions were also generated. COMPARISON:  None. FINDINGS: Alignment: Normal. Skull base and vertebrae: No acute fracture. No primary bone lesion or focal pathologic process. Soft tissues and spinal canal: No prevertebral fluid or swelling. No visible canal hematoma. Disc levels: Preserved disc spaces.No large disc herniation, significant spinal canal or neuroforaminal narrowing. Upper chest: Negative. Other: None. IMPRESSION: No acute cervical spine fracture. Electronically Signed   By: Maurine Simmering   On: 12/24/2020 14:17   CT ABDOMEN PELVIS W CONTRAST  Result Date: 12/24/2020 CLINICAL DATA:  RLQ abdominal pain r/o appy EXAM: CT ABDOMEN AND PELVIS WITH CONTRAST TECHNIQUE: Multidetector CT imaging of the abdomen and pelvis was performed using the standard protocol following bolus administration of intravenous contrast. CONTRAST:  140m OMNIPAQUE IOHEXOL 300 MG/ML  SOLN COMPARISON:  None. FINDINGS: Lower chest: No acute abnormality. Hepatobiliary: No focal liver abnormality is seen. No gallstones, gallbladder wall thickening, or biliary  dilatation. Pancreas: Unremarkable. No pancreatic ductal dilatation or surrounding inflammatory changes. Spleen: Normal in size without focal abnormality. Adrenals/Urinary Tract: Adrenal glands are unremarkable. Kidneys are normal, without renal calculi, focal lesion, or hydronephrosis. Bladder is unremarkable. Stomach/Bowel: Stomach is within normal limits. Appendix appears normal. No evidence of bowel wall thickening, distention, or inflammatory changes. Vascular/Lymphatic:  No significant vascular findings are present. No enlarged abdominal or pelvic lymph nodes. Reproductive: Unremarkable. Other: No abdominal wall hernia or abnormality. No abdominopelvic ascites. Musculoskeletal: No acute osseous abnormality. Anterior superior limbus vertebrae of S1. IMPRESSION: No acute abdominopelvic abnormality.  Normal appendix. Electronically Signed   By: Maurine Simmering   On: 12/24/2020 14:14   EEG adult  Result Date: 12/25/2020 Phillips Odor, MD     12/25/2020  3:12 PM [place] A. Merlene Laughter, MD     [email or web address]       HISTORY: This is a 30 year old who presents with seizures. MEDICATIONS: Current Facility-Administered Medications:   acetaminophen (TYLENOL) tablet 650 mg, 650 mg, Oral, Q6H PRN **OR** acetaminophen (TYLENOL) suppository 650 mg, 650 mg, Rectal, Q6H PRN, Caitlan Chauca, MD   acetaminophen (TYLENOL) tablet 1,000 mg, 1,000 mg, Oral, Once, Kommor, Madison, MD   amLODipine (NORVASC) tablet 5 mg, 5 mg, Oral, Daily, Korynn Kenedy, MD, 5 mg at 12/25/20 1302   dicyclomine (BENTYL) capsule 20 mg, 20 mg, Oral, BID PRN, Hart Robinsons A, RPH   enoxaparin (LOVENOX) injection 40 mg, 40 mg, Subcutaneous, Q24H, Isabele Lollar, MD, 40 mg at 32/67/12 4580   folic acid (FOLVITE) tablet 1 mg, 1 mg, Oral, Daily, Felice Deem, MD, 1 mg at 12/25/20 1301   levETIRAcetam (KEPPRA) tablet 500 mg, 500 mg, Oral, BID, Jamil Castillo, MD, 500 mg at 12/25/20 1301   multivitamin with minerals tablet 1 tablet, 1 tablet, Oral, Daily, Schon Zeiders, MD, 1 tablet at 12/25/20 1301   ondansetron (ZOFRAN) tablet 4 mg, 4 mg, Oral, Q6H PRN **OR** ondansetron (ZOFRAN) injection 4 mg, 4 mg, Intravenous, Q6H PRN, Jayesh Marbach, MD   pantoprazole (PROTONIX) EC tablet 40 mg, 40 mg, Oral, Daily, Aleyza Salmi, MD, 40 mg at 12/25/20 1302 ANALYSIS: A 16 channel recording using standard 10 20 measurements is conducted for 25 minutes.  There is a well-formed posterior dominant rhythm of 10-10.5 hertz which attenuates with eye opening.  There has been activity observed in the frontal areas. Awake and drowsy architecture observed. Photic stimulation is carried out without abnormal changes in the background activity. There is no focal or lateral slowing. There is no epileptiform activities noted. IMPRESSION: This is a normal recording of awake and drowsy states. Kofi A. Merlene Laughter, M.D. Diplomate, Tax adviser of Psychiatry and Neurology ( Neurology).   US Abdomen Limited RUQ (LIVER/GB)  Result Date: 12/25/2020 CLINICAL DATA:  30 year old male with history of elevated liver function tests for the past 6 months. EXAM: ULTRASOUND ABDOMEN LIMITED RIGHT UPPER QUADRANT COMPARISON:  Abdominal ultrasound 03/20/2018. FINDINGS: Gallbladder: No gallstones or wall thickening visualized. No sonographic Murphy sign noted by sonographer. Common bile duct: Diameter: 3.1 mm Liver: No focal lesion identified. Diffusely increased hepatic echogenicity. Portal vein is patent on color Doppler imaging with normal direction of blood flow towards the liver. Other: None. IMPRESSION: 1. No acute findings. 2. Hepatic steatosis. Electronically Signed   By: Vinnie Langton M.D.   On: 12/25/2020 10:20        Discharge Exam: Vitals:   12/25/20 0611 12/25/20 1356  BP: (!) 140/99 (!) 142/95  Pulse: (!) 56 68  Resp: 18 18  Temp: 98.4 F (36.9 C) 97.9 F (36.6 C)  SpO2: 100% 100%   Vitals:   12/24/20 2049 12/25/20 0135 12/25/20 0611 12/25/20 1356  BP: (!) 155/95 (!) 146/98 (!) 140/99 (!) 142/95  Pulse: 66 (!) 58 (!) 56 68  Resp: 18 18 18 18   Temp: 98.6 F (37 C) 98.6 F (37 C) 98.4 F (36.9 C) 97.9 F (36.6 C)  TempSrc: Oral Oral Oral Oral  SpO2: 100% 100% 100% 100%  Weight:      Height:        General: Pt is alert, awake, not in acute distress Cardiovascular: RRR, S1/S2 +, no rubs, no gallops Respiratory: CTA bilaterally, no wheezing, no rhonchi Abdominal: Soft, NT, ND, bowel sounds + Extremities: no edema, no cyanosis   The results of  significant diagnostics from this hospitalization (including imaging, microbiology, ancillary and laboratory) are listed below for reference.    Significant Diagnostic Studies: DG Chest 2 View  Result Date: 12/24/2020 CLINICAL DATA:  chest pain EXAM: CHEST - 2 VIEW COMPARISON:  None. FINDINGS: The heart size and mediastinal contours are within normal limits. Both lungs are clear. The visualized skeletal structures are unremarkable. IMPRESSION: No evidence of acute cardiopulmonary disease. Electronically Signed   By: Maurine Simmering   On: 12/24/2020 14:05   CT Head Wo Contrast  Result Date: 12/24/2020 CLINICAL DATA:  Head trauma, mod-severe seizure post head trauma EXAM: CT HEAD WITHOUT CONTRAST TECHNIQUE: Contiguous axial images were obtained from the base of the skull through the vertex without intravenous contrast. COMPARISON:  Head CT 08/29/2020 FINDINGS: Brain: No evidence of acute infarction, hemorrhage, hydrocephalus, extra-axial collection or mass lesion/mass effect.The ventricles are normal in size. Vascular: No hyperdense vessel or unexpected calcification. Skull: Normal. Negative for fracture or focal lesion. Sinuses/Orbits: Minimal paranasal sinus mucosal thickening. Other: None. IMPRESSION: No acute intracranial abnormality. Electronically Signed   By: Maurine Simmering   On: 12/24/2020 14:08   CT CERVICAL SPINE WO CONTRAST  Result Date: 12/24/2020 CLINICAL DATA:  Neck trauma, intoxicated or obtunded (Age >= 16y) EXAM: CT CERVICAL SPINE WITHOUT CONTRAST TECHNIQUE: Multidetector CT imaging of the cervical spine was performed without intravenous contrast. Multiplanar CT image reconstructions were also generated. COMPARISON:  None. FINDINGS: Alignment: Normal. Skull base and vertebrae: No acute fracture. No primary bone lesion or focal pathologic process. Soft tissues and spinal canal: No prevertebral fluid or swelling. No visible canal hematoma. Disc levels: Preserved disc spaces.No large disc herniation,  significant spinal canal or neuroforaminal narrowing. Upper chest: Negative. Other: None. IMPRESSION: No acute cervical spine fracture. Electronically Signed   By: Maurine Simmering   On: 12/24/2020 14:17   CT ABDOMEN PELVIS W CONTRAST  Result Date: 12/24/2020 CLINICAL DATA:  RLQ abdominal pain r/o appy EXAM: CT ABDOMEN AND PELVIS WITH CONTRAST TECHNIQUE: Multidetector CT imaging of the abdomen and pelvis was performed using the standard protocol following bolus administration of intravenous contrast. CONTRAST:  153m OMNIPAQUE IOHEXOL 300 MG/ML  SOLN COMPARISON:  None. FINDINGS: Lower chest: No acute abnormality. Hepatobiliary: No focal liver abnormality is seen. No gallstones, gallbladder wall thickening, or biliary dilatation. Pancreas: Unremarkable. No pancreatic ductal dilatation or surrounding inflammatory changes. Spleen: Normal in size without focal abnormality. Adrenals/Urinary Tract: Adrenal glands are unremarkable. Kidneys are normal, without renal calculi, focal lesion, or hydronephrosis. Bladder is unremarkable. Stomach/Bowel: Stomach is within normal limits. Appendix appears normal. No evidence of bowel wall thickening, distention, or inflammatory changes. Vascular/Lymphatic: No significant vascular findings are present. No enlarged abdominal or pelvic lymph  nodes. Reproductive: Unremarkable. Other: No abdominal wall hernia or abnormality. No abdominopelvic ascites. Musculoskeletal: No acute osseous abnormality. Anterior superior limbus vertebrae of S1. IMPRESSION: No acute abdominopelvic abnormality.  Normal appendix. Electronically Signed   By: Maurine Simmering   On: 12/24/2020 14:14   EEG adult  Result Date: 12/25/2020 Phillips Odor, MD     12/25/2020  3:12 PM [place] A. Merlene Laughter, MD     [email or web address]       HISTORY: This is a 30 year old who presents with seizures. MEDICATIONS: Current Facility-Administered Medications:   acetaminophen (TYLENOL) tablet 650 mg, 650 mg, Oral, Q6H  PRN **OR** acetaminophen (TYLENOL) suppository 650 mg, 650 mg, Rectal, Q6H PRN, Camary Sosa, MD   acetaminophen (TYLENOL) tablet 1,000 mg, 1,000 mg, Oral, Once, Kommor, Madison, MD   amLODipine (NORVASC) tablet 5 mg, 5 mg, Oral, Daily, Rekha Hobbins, MD, 5 mg at 12/25/20 1302   dicyclomine (BENTYL) capsule 20 mg, 20 mg, Oral, BID PRN, Hart Robinsons A, RPH   enoxaparin (LOVENOX) injection 40 mg, 40 mg, Subcutaneous, Q24H, Jearlean Demauro, MD, 40 mg at 16/10/96 0454   folic acid (FOLVITE) tablet 1 mg, 1 mg, Oral, Daily, Josimar Corning, MD, 1 mg at 12/25/20 1301   levETIRAcetam (KEPPRA) tablet 500 mg, 500 mg, Oral, BID, Keyana Guevara, MD, 500 mg at 12/25/20 1301   multivitamin with minerals tablet 1 tablet, 1 tablet, Oral, Daily, Mehar Kirkwood, MD, 1 tablet at 12/25/20 1301   ondansetron (ZOFRAN) tablet 4 mg, 4 mg, Oral, Q6H PRN **OR** ondansetron (ZOFRAN) injection 4 mg, 4 mg, Intravenous, Q6H PRN, Kemo Spruce, MD   pantoprazole (PROTONIX) EC tablet 40 mg, 40 mg, Oral, Daily, Herley Bernardini, MD, 40 mg at 12/25/20 1302 ANALYSIS: A 16 channel recording using standard 10 20 measurements is conducted for 25 minutes.  There is a well-formed posterior dominant rhythm of 10-10.5 hertz which attenuates with eye opening. There has been activity observed in the frontal areas. Awake and drowsy architecture observed. Photic stimulation is carried out without abnormal changes in the background activity. There is no focal or lateral slowing. There is no epileptiform activities noted. IMPRESSION: This is a normal recording of awake and drowsy states. Kofi A. Merlene Laughter, M.D. Diplomate, Tax adviser of Psychiatry and Neurology ( Neurology).   US Abdomen Limited RUQ (LIVER/GB)  Result Date: 12/25/2020 CLINICAL DATA:  30 year old male with history of elevated liver function tests for the past 6 months. EXAM: ULTRASOUND ABDOMEN LIMITED RIGHT UPPER QUADRANT COMPARISON:  Abdominal ultrasound 03/20/2018. FINDINGS: Gallbladder: No gallstones or wall thickening  visualized. No sonographic Murphy sign noted by sonographer. Common bile duct: Diameter: 3.1 mm Liver: No focal lesion identified. Diffusely increased hepatic echogenicity. Portal vein is patent on color Doppler imaging with normal direction of blood flow towards the liver. Other: None. IMPRESSION: 1. No acute findings. 2. Hepatic steatosis. Electronically Signed   By: Vinnie Langton M.D.   On: 12/25/2020 10:20    Microbiology: Recent Results (from the past 240 hour(s))  Resp Panel by RT-PCR (Flu A&B, Covid) Nasopharyngeal Swab     Status: None   Collection Time: 12/24/20  2:06 PM   Specimen: Nasopharyngeal Swab; Nasopharyngeal(NP) swabs in vial transport medium  Result Value Ref Range Status   SARS Coronavirus 2 by RT PCR NEGATIVE NEGATIVE Final    Comment: (NOTE) SARS-CoV-2 target nucleic acids are NOT DETECTED.  The SARS-CoV-2 RNA is generally detectable in upper respiratory specimens during the acute phase of infection. The lowest concentration of SARS-CoV-2 viral copies this  assay can detect is 138 copies/mL. A negative result does not preclude SARS-Cov-2 infection and should not be used as the sole basis for treatment or other patient management decisions. A negative result may occur with  improper specimen collection/handling, submission of specimen other than nasopharyngeal swab, presence of viral mutation(s) within the areas targeted by this assay, and inadequate number of viral copies(<138 copies/mL). A negative result must be combined with clinical observations, patient history, and epidemiological information. The expected result is Negative.  Fact Sheet for Patients:  EntrepreneurPulse.com.au  Fact Sheet for Healthcare Providers:  IncredibleEmployment.be  This test is no t yet approved or cleared by the [place] FDA and  has been authorized for detection and/or diagnosis of SARS-CoV-2 by FDA under an Emergency Use Authorization  (EUA). This EUA will remain  in effect (meaning this test can be used) for the duration of the COVID-19 declaration under Section 564(b)(1) of the Act, 21 U.S.C.section 360bbb-3(b)(1), unless the authorization is terminated  or revoked sooner.       Influenza A by PCR NEGATIVE NEGATIVE Final   Influenza B by PCR NEGATIVE NEGATIVE Final    Comment: (NOTE) The Xpert Xpress SARS-CoV-2/FLU/RSV plus assay is intended as an aid in the diagnosis of influenza from Nasopharyngeal swab specimens and should not be used as a sole basis for treatment. Nasal washings and aspirates are unacceptable for Xpert Xpress SARS-CoV-2/FLU/RSV testing.  Fact Sheet for Patients: EntrepreneurPulse.com.au  Fact Sheet for Healthcare Providers: IncredibleEmployment.be  This test is not yet approved or cleared by the [place] FDA and has been authorized for detection and/or diagnosis of SARS-CoV-2 by FDA under an Emergency Use Authorization (EUA). This EUA will remain in effect (meaning this test can be used) for the duration of the COVID-19 declaration under Section 564(b)(1) of the Act, 21 U.S.C. section 360bbb-3(b)(1), unless the authorization is terminated or revoked.  Performed at [facility], [address], [place], [place] [address]      Labs: Basic Metabolic Panel: Recent Labs  Lab 12/24/20 1205 12/25/20 0505  NA 136 138  K 3.9 3.0*  CL 103 103  CO2 22 27  GLUCOSE 202* 88  BUN 11 8  CREATININE 0.90 0.94  CALCIUM 8.8* 8.6*   Liver Function Tests: Recent Labs  Lab 12/24/20 1205 12/25/20 0505  AST 70* 46*  ALT 30 24  ALKPHOS 54 50  BILITOT 1.1 1.6*  PROT 7.6 6.7  ALBUMIN 4.4 3.8   Recent Labs  Lab 12/24/20 1205  LIPASE 84*   No results for input(s): AMMONIA in the last 168 hours. CBC: Recent Labs  Lab 12/24/20 1205 12/25/20 0505  WBC 5.2 7.4  NEUTROABS 4.4  --   HGB 13.6 12.9*  HCT 41.2 39.5  MCV 95.2 96.8  PLT 160 155    Cardiac Enzymes: No results for input(s): CKTOTAL, CKMB, CKMBINDEX, TROPONINI in the last 168 hours. BNP: Invalid input(s): POCBNP CBG: No results for input(s): GLUCAP in the last 168 hours.  Time coordinating discharge:  36 minutes  Signed:  Orson Eva, DO [facility] Pager: [phone] 12/25/2020, 3:40 PM

## 2020-12-25 NOTE — Plan of Care (Signed)
  Problem: Education: Goal: Knowledge of General Education information will improve Description Including pain rating scale, medication(s)/side effects and non-pharmacologic comfort measures Outcome: Progressing   Problem: Health Behavior/Discharge Planning: Goal: Ability to manage health-related needs will improve Outcome: Progressing   

## 2020-12-25 NOTE — Procedures (Signed)
  [facility] [place] A. Gerilyn Pilgrim, MD     [email or web address]           HISTORY: This is a 30 year old who presents with seizures.  MEDICATIONS:  Current Facility-Administered Medications:    acetaminophen (TYLENOL) tablet 650 mg, 650 mg, Oral, Q6H PRN **OR** acetaminophen (TYLENOL) suppository 650 mg, 650 mg, Rectal, Q6H PRN, Tat, David, MD   acetaminophen (TYLENOL) tablet 1,000 mg, 1,000 mg, Oral, Once, Kommor, Madison, MD   amLODipine (NORVASC) tablet 5 mg, 5 mg, Oral, Daily, Tat, David, MD, 5 mg at 12/25/20 1302   dicyclomine (BENTYL) capsule 20 mg, 20 mg, Oral, BID PRN, Valrie Hart A, RPH   enoxaparin (LOVENOX) injection 40 mg, 40 mg, Subcutaneous, Q24H, Tat, David, MD, 40 mg at 12/24/20 2255   folic acid (FOLVITE) tablet 1 mg, 1 mg, Oral, Daily, Tat, David, MD, 1 mg at 12/25/20 1301   levETIRAcetam (KEPPRA) tablet 500 mg, 500 mg, Oral, BID, Tat, David, MD, 500 mg at 12/25/20 1301   multivitamin with minerals tablet 1 tablet, 1 tablet, Oral, Daily, Tat, David, MD, 1 tablet at 12/25/20 1301   ondansetron (ZOFRAN) tablet 4 mg, 4 mg, Oral, Q6H PRN **OR** ondansetron (ZOFRAN) injection 4 mg, 4 mg, Intravenous, Q6H PRN, Tat, David, MD   pantoprazole (PROTONIX) EC tablet 40 mg, 40 mg, Oral, Daily, Tat, David, MD, 40 mg at 12/25/20 1302     ANALYSIS: A 16 channel recording using standard 10 20 measurements is conducted for 25 minutes.  There is a well-formed posterior dominant rhythm of 10-10.5 hertz which attenuates with eye opening. There has been activity observed in the frontal areas. Awake and drowsy architecture observed. Photic stimulation is carried out without abnormal changes in the background activity. There is no focal or lateral slowing. There is no epileptiform activities noted.   IMPRESSION: This is a normal recording of awake and drowsy states.      Jamarl Pew A. Gerilyn Pilgrim, M.D.  Diplomate, Biomedical engineer of Psychiatry and Neurology ( Neurology).

## 2020-12-25 NOTE — Progress Notes (Signed)
Received report from [place], [place]. Assumed pt care at 0700. Pt resting comfortably in bed in no acute distress. A&O. VSS. No S&S of seizure activity noted. Tolerating diet. Denies pain. Pt states that he needs to leave today to go see his daughter. Dr. Arbutus Leas, MD notified of pt's request. Instructed pt that Dr. Arbutus Leas would be by soon to discuss his POC and possible D/C home today. Pt verbalized understanding.  Call bell within reach. Will continue to monitor.

## 2021-03-21 ENCOUNTER — Encounter (HOSPITAL_BASED_OUTPATIENT_CLINIC_OR_DEPARTMENT_OTHER): Payer: Self-pay | Admitting: Obstetrics and Gynecology

## 2021-03-21 ENCOUNTER — Other Ambulatory Visit: Payer: Self-pay

## 2021-03-21 ENCOUNTER — Emergency Department (HOSPITAL_BASED_OUTPATIENT_CLINIC_OR_DEPARTMENT_OTHER)
Admission: EM | Admit: 2021-03-21 | Discharge: 2021-03-21 | Disposition: A | Discharge status: Home / Self Care | Payer: No Typology Code available for payment source | Attending: Emergency Medicine | Admitting: Emergency Medicine

## 2021-03-21 DIAGNOSIS — B349 Viral infection, unspecified: Secondary | ICD-10-CM | POA: Insufficient documentation

## 2021-03-21 DIAGNOSIS — Z20822 Contact with and (suspected) exposure to covid-19: Secondary | ICD-10-CM | POA: Insufficient documentation

## 2021-03-21 LAB — RESP PANEL BY RT-PCR (FLU A&B, COVID) ARPGX2
Influenza A by PCR: NEGATIVE
Influenza B by PCR: NEGATIVE
SARS Coronavirus 2 by RT PCR: NEGATIVE

## 2021-03-21 MED ORDER — ONDANSETRON 4 MG PO TBDP: 4.0000 mg | ORAL_TABLET | Freq: Three times a day (TID) | ORAL | 0 refills | Status: DC | PRN

## 2021-03-21 NOTE — Discharge Instructions (Signed)
You likely have a viral illness.  This should be treated symptomatically. Use Tylenol or ibuprofen as needed for fevers or body aches. Use over-the-counter cough drops and syrup as needed for cough. You Zofran as needed for nausea or vomiting. Make sure you stay well-hydrated with water. Wash your hands frequently to prevent spread of infection. Follow-up with your primary care doctor in 1 week if your symptoms are not improving. Return to the emergency room if you develop chest pain, difficulty breathing, or any new or worsening symptoms.

## 2021-03-21 NOTE — ED Triage Notes (Signed)
Patient presents for flu like symptoms since yesterday morning and reports his daughter has been sick since yesterday as well

## 2021-03-21 NOTE — ED Provider Notes (Signed)
[facility] EMERGENCY DEPT Provider Note   CSN: [id number] Arrival date & time: 03/21/21  1310     History Chief Complaint  Patient presents with   Cough    Gerald Anderson is a 30 y.o. male presenting for evaluation of body aches, cough, congestion, nausea and vomiting.  Patient states he started to get ill yesterday.  He reports a nonproductive cough, nasal congestion, 3 episodes of emesis, loose stools.  He reports generalized body aches.  Took Tylenol without improvement of symptoms.  Has not take anything else.  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  He is not having any chest pain.  His daughter is sick with the same symptoms, no other sick contacts.  He is vaccinated for COVID, not flu.  HPI     Past Medical History:  Diagnosis Date   Back pain    Kawasaki disease [facility])     Patient Active Problem List   Diagnosis Date Noted   Seizure disorder (HCC) 12/24/2020   Transaminasemia 12/24/2020   Hypertensive urgency 08/30/2020   Hypernatremia 08/30/2020   Metabolic acidosis 08/30/2020   Seizure (HCC) 08/29/2020   Acute gangrenous tonsillitis 11/13/2014    History reviewed. No pertinent surgical history.     No family history on file.  Social History   Tobacco Use   Smoking status: Never   Smokeless tobacco: Never  Vaping Use   Vaping Use: Never used  Substance Use Topics   Alcohol use: Yes    Comment: socially   Drug use: No    Home Medications Prior to Admission medications   Medication Sig Start Date End Date Taking? Authorizing Provider  ondansetron (ZOFRAN ODT) 4 MG disintegrating tablet Take 1 tablet (4 mg total) by mouth every 8 (eight) hours as needed for nausea or vomiting. 03/21/21  Yes Jacquline Terrill, PA-C  amLODipine (NORVASC) 5 MG tablet Take 1 tablet (5 mg total) by mouth daily. 08/31/20   Kathlen Mody, MD  dicyclomine (BENTYL) 20 MG tablet Take 1 tablet (20 mg total) by mouth  2 (two) times daily as needed for up to 16 doses for spasms. 11/28/20   Terald Sleeper, MD  levETIRAcetam (KEPPRA) 500 MG tablet Take 1 tablet (500 mg total) by mouth 2 (two) times daily. 12/25/20   Catarina Hartshorn, MD  Menthol, Topical Analgesic, (ICY HOT BACK EX) Apply 1 application topically daily as needed (Back Pain).    [provider]    Allergies    Patient has no known allergies.  Review of Systems   Review of Systems  HENT:  Positive for congestion.   Respiratory:  Positive for cough.   Gastrointestinal:  Positive for diarrhea, nausea and vomiting.  Musculoskeletal:  Positive for myalgias.  All other systems reviewed and are negative.  Physical Exam Updated Vital Signs BP 139/89   Pulse 98   Temp 98.5 F (36.9 C)   Resp 20   SpO2 96%   Physical Exam Vitals and nursing note reviewed.  Constitutional:      General: He is not in acute distress.    Appearance: Normal appearance.     Comments: Resting in the bed in NAD  HENT:     Head: Normocephalic and atraumatic.     Right Ear: Tympanic membrane, ear canal and external ear normal.     Left Ear: Tympanic membrane, ear canal and external ear normal.     Mouth/Throat:     Pharynx: Uvula  midline.     Comments: OP clear without tonsillar swelling or exudates.  Uvula midline. Eyes:     Extraocular Movements: Extraocular movements intact.     Conjunctiva/sclera: Conjunctivae normal.     Pupils: Pupils are equal, round, and reactive to light.  Cardiovascular:     Rate and Rhythm: Normal rate and regular rhythm.     Pulses: Normal pulses.  Pulmonary:     Effort: Pulmonary effort is normal.     Breath sounds: Normal breath sounds. No decreased breath sounds, wheezing, rhonchi or rales.     Comments: Speaking in full sentences.  Clear lung sounds in all fields.  Sats stable on room air. Abdominal:     General: There is no distension.     Palpations: Abdomen is soft. There is no mass.     Tenderness: There is no  abdominal tenderness. There is no guarding or rebound.  Musculoskeletal:        General: Normal range of motion.     Cervical back: Normal range of motion.  Lymphadenopathy:     Cervical: No cervical adenopathy.  Skin:    General: Skin is warm.     Capillary Refill: Capillary refill takes less than 2 seconds.  Neurological:     Mental Status: He is alert and oriented to person, place, and time.    ED Results / Procedures / Treatments   Labs (all labs ordered are listed, but only abnormal results are displayed) Labs Reviewed  RESP PANEL BY RT-PCR (FLU A&B, COVID) [id number]    EKG None  Radiology No results found.  Procedures Procedures   Medications Ordered in ED Medications - No data to display  ED Course  I have reviewed the triage vital signs and the nursing notes.  Pertinent labs & imaging results that were available during my care of the patient were reviewed by me and considered in my medical decision making (see chart for details).    MDM Rules/Calculators/A&P                           Patient presenting with 1 day h/o URI symptoms.  Physical exam reassuring, patient is afebrile and appears nontoxic.  Pulmonary exam reassuring.  Doubt pneumonia, strep, other bacterial infection, or peritonsillar abscess.  COVID and flu pending. Likely viral illness.  Will treat symptomatically.  Patient to follow-up with primary care as needed.  At this time, patient appears safe for discharge.  Return precautions given.  Patient states he understands and agrees to plan.  Final Clinical Impression(s) / ED Diagnoses Final diagnoses:  Viral illness    Rx / DC Orders ED Discharge Orders          Ordered    ondansetron (ZOFRAN ODT) 4 MG disintegrating tablet  Every 8 hours PRN        03/21/21 1410             Rahul Malinak, PA-C 03/21/21 1417    Linwood Dibbles, MD 03/21/21 1745

## 2021-08-07 ENCOUNTER — Emergency Department (HOSPITAL_COMMUNITY): Payer: Self-pay

## 2021-08-07 ENCOUNTER — Encounter (HOSPITAL_COMMUNITY): Payer: Self-pay

## 2021-08-07 ENCOUNTER — Observation Stay (HOSPITAL_COMMUNITY)
Admission: EM | Admit: 2021-08-07 | Discharge: 2021-08-08 | Disposition: A | Discharge status: Home / Self Care | Payer: Self-pay | Attending: Internal Medicine | Admitting: Internal Medicine

## 2021-08-07 ENCOUNTER — Other Ambulatory Visit: Payer: Self-pay

## 2021-08-07 DIAGNOSIS — Z20822 Contact with and (suspected) exposure to covid-19: Secondary | ICD-10-CM | POA: Insufficient documentation

## 2021-08-07 DIAGNOSIS — R569 Unspecified convulsions: Principal | ICD-10-CM

## 2021-08-07 DIAGNOSIS — R7989 Other specified abnormal findings of blood chemistry: Secondary | ICD-10-CM | POA: Diagnosis present

## 2021-08-07 DIAGNOSIS — Z79899 Other long term (current) drug therapy: Secondary | ICD-10-CM | POA: Insufficient documentation

## 2021-08-07 DIAGNOSIS — I10 Essential (primary) hypertension: Secondary | ICD-10-CM | POA: Diagnosis present

## 2021-08-07 HISTORY — DX: Unspecified convulsions: R56.9

## 2021-08-07 LAB — RESP PANEL BY RT-PCR (FLU A&B, COVID) ARPGX2
Influenza A by PCR: NEGATIVE
Influenza B by PCR: NEGATIVE
SARS Coronavirus 2 by RT PCR: NEGATIVE

## 2021-08-07 LAB — COMPREHENSIVE METABOLIC PANEL
ALT: 96 U/L — ABNORMAL HIGH (ref 0–44)
AST: 174 U/L — ABNORMAL HIGH (ref 15–41)
Albumin: 4.1 g/dL (ref 3.5–5.0)
Alkaline Phosphatase: 48 U/L (ref 38–126)
Anion gap: 17 — ABNORMAL HIGH (ref 5–15)
BUN: 6 mg/dL (ref 6–20)
CO2: 20 mmol/L — ABNORMAL LOW (ref 22–32)
Calcium: 8.8 mg/dL — ABNORMAL LOW (ref 8.9–10.3)
Chloride: 101 mmol/L (ref 98–111)
Creatinine, Ser: 1.3 mg/dL — ABNORMAL HIGH (ref 0.61–1.24)
GFR, Estimated: >60 mL/min (ref >60)
Glucose, Bld: 148 mg/dL — ABNORMAL HIGH (ref 70–99)
Potassium: 3.7 mmol/L (ref 3.5–5.1)
Sodium: 138 mmol/L (ref 135–145)
Total Bilirubin: 0.3 mg/dL (ref 0.3–1.2)
Total Protein: 6.8 g/dL (ref 6.5–8.1)

## 2021-08-07 LAB — CBC WITH DIFFERENTIAL/PLATELET
Abs Immature Granulocytes: 0.05 10*3/uL (ref 0.00–0.07)
Basophils Absolute: 0 10*3/uL (ref 0.0–0.1)
Basophils Relative: 0 %
Eosinophils Absolute: 0 10*3/uL (ref 0.0–0.5)
Eosinophils Relative: 0 %
HCT: 40.4 % (ref 39.0–52.0)
Hemoglobin: 12.9 g/dL — ABNORMAL LOW (ref 13.0–17.0)
Immature Granulocytes: 1 %
Lymphocytes Relative: 9 %
Lymphs Abs: 0.7 10*3/uL (ref 0.7–4.0)
MCH: 31.3 pg (ref 26.0–34.0)
MCHC: 31.9 g/dL (ref 30.0–36.0)
MCV: 98.1 fL (ref 80.0–100.0)
Monocytes Absolute: 0.7 10*3/uL (ref 0.1–1.0)
Monocytes Relative: 9 %
Neutro Abs: 6.5 10*3/uL (ref 1.7–7.7)
Neutrophils Relative %: 81 %
Platelets: 173 10*3/uL (ref 150–400)
RBC: 4.12 MIL/uL — ABNORMAL LOW (ref 4.22–5.81)
RDW: 12.8 % (ref 11.5–15.5)
WBC: 8 10*3/uL (ref 4.0–10.5)
nRBC: 0 % (ref 0.0–0.2)

## 2021-08-07 LAB — CBG MONITORING, ED: Glucose-Capillary: 166 mg/dL — ABNORMAL HIGH (ref 70–99)

## 2021-08-07 LAB — RAPID URINE DRUG SCREEN, HOSP PERFORMED
Amphetamines: NOT DETECTED
Barbiturates: NOT DETECTED
Benzodiazepines: POSITIVE — AB
Cocaine: NOT DETECTED
Opiates: NOT DETECTED
Tetrahydrocannabinol: NOT DETECTED

## 2021-08-07 LAB — ETHANOL: Alcohol, Ethyl (B): <10 mg/dL (ref <10)

## 2021-08-07 MED ORDER — ACETAMINOPHEN 325 MG PO TABS
650.0000 mg | ORAL_TABLET | ORAL | Status: DC | PRN
  Administered 2021-08-08: 650 mg via ORAL
  Filled 2021-08-07 (×2): qty 2

## 2021-08-07 MED ORDER — LACTATED RINGERS IV SOLN
75.0000 mL/h | INTRAVENOUS | Status: DC
  Administered 2021-08-07: 75 mL/h via INTRAVENOUS

## 2021-08-07 MED ORDER — SODIUM CHLORIDE 0.9 % IV BOLUS
1000.0000 mL | Freq: Once | INTRAVENOUS | Status: AC
Start: 2021-08-07 — End: 2021-08-07
  Administered 2021-08-07: 1000 mL via INTRAVENOUS

## 2021-08-07 MED ORDER — ACETAMINOPHEN 650 MG RE SUPP
650.0000 mg | RECTAL | Status: DC | PRN
  Administered 2021-08-07: 650 mg via RECTAL

## 2021-08-07 MED ORDER — LORAZEPAM 2 MG/ML IJ SOLN
INTRAMUSCULAR | Status: AC
  Filled 2021-08-07: qty 1

## 2021-08-07 MED ORDER — HYDRALAZINE HCL 20 MG/ML IJ SOLN: 5.0000 mg | INTRAMUSCULAR | Status: DC | PRN

## 2021-08-07 MED ORDER — ORAL CARE MOUTH RINSE
15.0000 mL | OROMUCOSAL | Status: DC
  Administered 2021-08-07: 15 mL via OROMUCOSAL

## 2021-08-07 MED ORDER — LORAZEPAM 2 MG/ML IJ SOLN: 4.0000 mg | INTRAMUSCULAR | Status: DC | PRN

## 2021-08-07 MED ORDER — LORAZEPAM 2 MG/ML IJ SOLN
2.0000 mg | Freq: Once | INTRAMUSCULAR | Status: AC
  Administered 2021-08-07: 2 mg via INTRAVENOUS

## 2021-08-07 MED ORDER — LORAZEPAM 2 MG/ML IJ SOLN
INTRAMUSCULAR | Status: AC
  Administered 2021-08-07: 4 mg via INTRAVENOUS
  Filled 2021-08-07: qty 1

## 2021-08-07 MED ORDER — SODIUM CHLORIDE 0.9 % IV SOLN
4000.0000 mg | Freq: Once | INTRAVENOUS | Status: AC
  Administered 2021-08-07: 4000 mg via INTRAVENOUS
  Filled 2021-08-07: qty 40

## 2021-08-07 MED ORDER — LORAZEPAM 2 MG/ML IJ SOLN
2.0000 mg | Freq: Once | INTRAMUSCULAR | Status: AC
Start: 2021-08-07 — End: 2021-08-07
  Administered 2021-08-07: 2 mg via INTRAVENOUS

## 2021-08-07 MED ORDER — ENOXAPARIN SODIUM 40 MG/0.4ML IJ SOSY
40.0000 mg | PREFILLED_SYRINGE | INTRAMUSCULAR | Status: DC
  Administered 2021-08-07: 40 mg via SUBCUTANEOUS
  Filled 2021-08-07: qty 0.4

## 2021-08-07 MED ORDER — ONDANSETRON HCL 4 MG/2ML IJ SOLN: 4.0000 mg | Freq: Four times a day (QID) | INTRAMUSCULAR | Status: DC | PRN

## 2021-08-07 MED ORDER — CHLORHEXIDINE GLUCONATE 0.12% ORAL RINSE (MEDLINE KIT): 15.0000 mL | Freq: Two times a day (BID) | OROMUCOSAL | Status: DC

## 2021-08-07 MED ORDER — AMLODIPINE BESYLATE 5 MG PO TABS
5.0000 mg | ORAL_TABLET | Freq: Every day | ORAL | Status: DC
Start: 2021-08-07 — End: 2021-08-08
  Administered 2021-08-08: 5 mg via ORAL
  Filled 2021-08-07: qty 1

## 2021-08-07 MED ORDER — SODIUM CHLORIDE 0.9% FLUSH
3.0000 mL | Freq: Two times a day (BID) | INTRAVENOUS | Status: DC
  Administered 2021-08-07: 3 mL via INTRAVENOUS

## 2021-08-07 MED ORDER — LEVETIRACETAM IN NACL 1000 MG/100ML IV SOLN
1000.0000 mg | Freq: Two times a day (BID) | INTRAVENOUS | Status: DC
  Administered 2021-08-07: 1000 mg via INTRAVENOUS
  Filled 2021-08-07: qty 100

## 2021-08-07 MED ORDER — ONDANSETRON HCL 4 MG PO TABS: 4.0000 mg | ORAL_TABLET | Freq: Four times a day (QID) | ORAL | Status: DC | PRN

## 2021-08-07 MED ORDER — LORAZEPAM 2 MG/ML IJ SOLN: 4.0000 mg | Freq: Once | INTRAMUSCULAR | Status: AC

## 2021-08-07 MED ORDER — LORAZEPAM 2 MG/ML IJ SOLN
INTRAMUSCULAR | Status: AC
  Filled 2021-08-07: qty 2

## 2021-08-07 NOTE — Assessment & Plan Note (Addendum)
Etiology likely secondary to EtOH abuse.  LFTs were trended and were improving at time of discharge.  Recommend alcohol cessation.  Outpatient follow-up with PCP with repeat CMP 1 week ?

## 2021-08-07 NOTE — Procedures (Signed)
Patient Name: Gerald Anderson  ?MRN: [id number]  ?Epilepsy Attending: Charlsie Quest  ?Referring Physician/Provider: Ernie Avena, MD ?Date: 08/07/2021 ?Duration: 22.52 mins ? ?Patient history: 31 year old male with history of seizure disorder, marijuana use, alcohol use brought in by EMS from home after seizure today.  EEG to evaluate for seizure. ? ?Level of alertness: Awake, asleep ? ?AEDs during EEG study: Keppra, Ativan ? ?Technical aspects: This EEG study was done with scalp electrodes positioned according to the 10-20 International system of electrode placement. Electrical activity was acquired at a sampling rate of 500Hz  and reviewed with a high frequency filter of 70Hz  and a low frequency filter of 1Hz . EEG data were recorded continuously and digitally stored.  ? ?Description: The posterior dominant rhythm consists of 9-10 Hz activity of moderate voltage (25-35 uV) seen predominantly in posterior head regions, symmetric and reactive to eye opening and eye closing. Sleep was characterized by vertex waves, sleep spindles (12 to 14 Hz), maximal frontocentral region. There is an excessive amount of 15 to 18 Hz beta activity distributed symmetrically and diffusely. Hyperventilation and photic stimulation were not performed.    ? ?ABNORMALITY ?- Excessive beta, generalized ? ?IMPRESSION: ?This study is within normal limits. The excessive beta activity seen in the background is most likely due to the effect of benzodiazepine and is a benign EEG pattern. No seizures or epileptiform discharges were seen throughout the recording. ? ?  ? ? ?

## 2021-08-07 NOTE — ED Notes (Signed)
Pt drowsy  he does not open his eyes and does not answer questions  no one in the room the pt dod mumbled something about a girlfirend ?

## 2021-08-07 NOTE — ED Notes (Signed)
Pt incontinent of urine primo -fit applied   bed changed  ?

## 2021-08-07 NOTE — ED Notes (Signed)
Seizure pads placed on both bed rails ?

## 2021-08-07 NOTE — Consult Note (Signed)
Neurology Consultation ? ?Reason for Consult: seizures ?Referring Physician: Dr. Ophelia Charter ? ?CC: multiple seizures ? ?History is obtained from:medical record ? ?HPI: Gerald Anderson is a 31 y.o. male with past medical history of seizures with noncompliance of medications who presents to the [facility] ED via EMS for evaluation of seizures at home. On arrival to the ED patient vomited and became unresponsive, screamed and then had an  episode of full body shaking lasting about a minute. Prior to episode he stated that he does not take his AEDs. He received 8mg  IV ativan and 4gm Keppra.  ?On exam, patient is very lethargic, did open eyes to painful stimuli, was able to mumble his first name. No family at bedside. Neurology consulted for seizures ? ? ?ROS:  Unable to obtain due to altered mental status.  ? ?Past Medical History:  ?Diagnosis Date  ? Back pain   ? Kawasaki disease (HCC)   ? Seizures (HCC)   ? ? ? ?History reviewed. No pertinent family history. ? ? ?Social History:  ? reports that he has never smoked. He has never used smokeless tobacco. He reports current alcohol use. He reports that he does not use drugs. ? ?Medications ?No current facility-administered medications for this encounter. ? ?Current Outpatient Medications:  ?  amLODipine (NORVASC) 5 MG tablet, Take 1 tablet (5 mg total) by mouth daily., Disp: 30 tablet, Rfl: 1 ?  dicyclomine (BENTYL) 20 MG tablet, Take 1 tablet (20 mg total) by mouth 2 (two) times daily as needed for up to 16 doses for spasms., Disp: 16 tablet, Rfl: 0 ?  levETIRAcetam (KEPPRA) 500 MG tablet, Take 1 tablet (500 mg total) by mouth 2 (two) times daily., Disp: 60 tablet, Rfl: 1 ?  Menthol, Topical Analgesic, (ICY HOT BACK EX), Apply 1 application topically daily as needed (Back Pain)., Disp: , Rfl:  ?  ondansetron (ZOFRAN ODT) 4 MG disintegrating tablet, Take 1 tablet (4 mg total) by mouth every 8 (eight) hours as needed for nausea or vomiting., Disp: 10 tablet, Rfl: 0 ? ? ?Exam: ?Current  vital signs: ?BP (!) 141/95   Pulse (!) 106   Temp 98.2 ?F (36.8 ?C) (Oral)   Resp (!) 33   Ht 6\' 1"  (1.854 m)   Wt 68 kg   SpO2 100%   BMI 19.79 kg/m?  ?Vital signs in last 24 hours: ?Temp:  [98.2 ?F (36.8 ?C)] 98.2 ?F (36.8 ?C) (03/15 0859) ?Pulse Rate:  [96-118] 106 (03/15 1030) ?Resp:  [16-33] 33 (03/15 1030) ?BP: (140-167)/(76-97) 141/95 (03/15 1030) ?SpO2:  [96 %-100 %] 100 % (03/15 1030) ?Weight:  [68 kg-69.4 kg] 68 kg (03/15 0937) ? ?GENERAL: lethargic in NAD on [place] 2lpm ?HEENT: - Normocephalic and atraumatic, dry mm ?LUNGS - Clear to auscultation bilaterally with no wheezes ?CV - S1S2 RRR, no m/r/g, equal pulses bilaterally. ?ABDOMEN - Soft, nontender, nondistended with normoactive BS ?Ext: warm, well perfused, intact peripheral pulses, no edema. Has scar tissue on left shoulder  ? ?NEURO:  ?Mental Status: lethargic, opens eyes to painful stimuli, was able to mutter his name.  ?Language: speech is mumbled.  ?Cranial Nerves: PERRL 41mm/brisk. EOMI, visual fields unable to assess, no facial asymmetry, facial sensation intact, hearing intact, tongue/uvula/soft palate midline, unable to assess sternocleidomastoid and trapezius muscle strength. No evidence of tongue atrophy or fibrillations ?Motor: moving all extremities spontaneously, withdraws in all 4 extremities  ?Tone: is normal and bulk is normal ?Sensation- responds top noxious stimuli  ?Coordination: unable to assess  ?Gait-  deferred ? ? ?Labs ?I have reviewed labs in epic and the results pertinent to this consultation are: ? ? ?CBC ?   ?Component Value Date/Time  ? WBC 8.0 08/07/2021 0900  ? RBC 4.12 (L) 08/07/2021 0900  ? HGB 12.9 (L) 08/07/2021 0900  ? HCT 40.4 08/07/2021 0900  ? PLT 173 08/07/2021 0900  ? MCV 98.1 08/07/2021 0900  ? MCH 31.3 08/07/2021 0900  ? MCHC 31.9 08/07/2021 0900  ? RDW 12.8 08/07/2021 0900  ? LYMPHSABS 0.7 08/07/2021 0900  ? MONOABS 0.7 08/07/2021 0900  ? EOSABS 0.0 08/07/2021 0900  ? BASOSABS 0.0 08/07/2021 0900   ? ? ?CMP  ?   ?Component Value Date/Time  ? NA 138 08/07/2021 1001  ? K 3.7 08/07/2021 1001  ? CL 101 08/07/2021 1001  ? CO2 20 (L) 08/07/2021 1001  ? GLUCOSE 148 (H) 08/07/2021 1001  ? BUN 6 08/07/2021 1001  ? CREATININE 1.30 (H) 08/07/2021 1001  ? CALCIUM 8.8 (L) 08/07/2021 1001  ? PROT 6.8 08/07/2021 1001  ? ALBUMIN 4.1 08/07/2021 1001  ? AST 174 (H) 08/07/2021 1001  ? ALT 96 (H) 08/07/2021 1001  ? ALKPHOS 48 08/07/2021 1001  ? BILITOT 0.3 08/07/2021 1001  ? GFRNONAA >60 08/07/2021 1001  ? GFRAA >60 08/03/2018 2332  ? ? ?Lipid Panel  ?No results found for: CHOL, TRIG, HDL, CHOLHDL, VLDL, LDLCALC, LDLDIRECT ? ? ?Imaging ?I have reviewed the images obtained: ? ?CT-head ?No acute intracranial findings  ? ?CT C-spine  ?No acute process ? ?Assessment:  ?Gerald Anderson is a 31 y.o. male with past medical history of seizures with noncompliance of medications who presents to the [facility] ED via EMS for evaluation of seizures at home. On arrival to the ED patient vomited and became unresponsive, screamed and then had an  episode of full body shaking lasting about a minute. Prior to episode he stated that he does not take his AEDs. He received 8mg  IV ativan and 4gm Keppra.  ? ?Ethanol <10 ?UDS ordered  ?Cr 1.30  ?WBC 8.0 ?Resp panel negative  ? ?rEEG This study is within normal limits. The excessive beta activity seen in the background is most likely due to the effect of benzodiazepine and is a benign EEG pattern. No seizures or epileptiform discharges were seen throughout the recording ? ?Impression: ?Seizures due to noncompliance with medications  ? ?Recommendations: ?-Admit to medicine  ?- Seizures precautions  ?- would continue Keppra @ 1000mg  BID, (home dose is 500mg  ) ?- Further recommendations will depend on the results of the above tests and patient progress  ?- Neurology will continue to follow  ? ? DNP, [facility]AG  ? ?I have seen the patient reviewed the above note.  He is lethargic, but arousable to noxious  stimulation.  He is able to follow commands and speak. ? ?My suspicion is this represents breakthrough seizure in the setting of medication noncompliance.  He has been restarted at 1 g twice daily, once I am able to confirm with him that he was not taking his home medication, would favor returning to his previous dose of 500 mg twice daily. ? ? , MD ?[facility] ?[phone] ? ?If 7pm- 7am, please page neurology on call as listed in AMION. ? ? ?

## 2021-08-07 NOTE — H&P (Signed)
History and Physical    PatientDivonte Anderson YSA:[id number] DOB: 12/14/1990 DOA: 08/07/2021 DOS: the patient was seen and examined on 08/07/2021 PCP: Patient, No Pcp Per (Inactive)  Patient coming from: Home; NOK: Mother, Gerald Anderson, [phone]   Chief Complaint: Seizure  HPI: Gerald Anderson is a 31 y.o. male with medical history significant of seizure d/o and HTN presenting with seizure.  According to EMS, he has not been taking his seizure medications.  The patient was post-ictal and unable to provide history at the time of admission.  I spoke with his mother, who was unaware of his presentation.  She acknowledges that he likely has not been taking his medications.    ER Course:  Neurology will do EEG.  2 seizures today.  Concern for status, not returning to baseline.  Has not been Keppra.       Review of Systems: unable to review all systems due to the inability of the patient to answer questions. Past Medical History:  Diagnosis Date   Back pain    Kawasaki disease (HCC)    Seizures (HCC)    History reviewed. No pertinent surgical history. Social History:  reports that he has never smoked. He has never used smokeless tobacco. He reports current alcohol use. He reports that he does not use drugs.  No Known Allergies  History reviewed. No pertinent family history.  Prior to Admission medications   Medication Sig Start Date End Date Taking? Authorizing Provider  amLODipine (NORVASC) 5 MG tablet Take 1 tablet (5 mg total) by mouth daily. 08/31/20   Kathlen Mody, MD  dicyclomine (BENTYL) 20 MG tablet Take 1 tablet (20 mg total) by mouth 2 (two) times daily as needed for up to 16 doses for spasms. 11/28/20   Terald Sleeper, MD  levETIRAcetam (KEPPRA) 500 MG tablet Take 1 tablet (500 mg total) by mouth 2 (two) times daily. 12/25/20   Catarina Hartshorn, MD  Menthol, Topical Analgesic, (ICY HOT BACK EX) Apply 1 application topically daily as needed (Back Pain).    [provider]  ondansetron (ZOFRAN ODT) 4 MG disintegrating tablet Take 1 tablet (4 mg total) by mouth every 8 (eight) hours as needed for nausea or vomiting. 03/21/21   Alveria Apley, PA-C    Physical Exam: Vitals:   08/07/21 1400 08/07/21 1430 08/07/21 1500 08/07/21 1530  BP: (!) 151/102 136/88 (!) 144/97 (!) 153/119  Pulse: 76 81 95 80  Resp: 15 14 15 16   Temp:      TempSrc:      SpO2: 100% 100% 100% 100%  Weight:      Height:       General:  Appears obtunded, unable to have a conversation or follow directions Eyes:  PERRL, EOMI, normal lids, iris ENT:  grossly normal hearing, lips & tongue, mmm Neck:  no LAD, masses or thyromegaly Cardiovascular:  RR with mild tachycardia, no m/r/g. No LE edema.  Respiratory:   CTA bilaterally with no wheezes/rales/rhonchi.  Normal respiratory effort. Abdomen:  soft, NT, ND Skin:  no rash or induration seen on limited exam Musculoskeletal:  grossly normal tone BUE/BLE, good ROM, no bony abnormality Psychiatric:  obtunded, does not respond to commands Neurologic:  unable to perform   Radiological Exams on Admission: Independently reviewed - see discussion in A/P where applicable  CT HEAD WO CONTRAST  Result Date: 08/07/2021 CLINICAL DATA:  Trauma, altered mental status EXAM: CT HEAD WITHOUT CONTRAST TECHNIQUE: Contiguous axial images were obtained from the base of  the skull through the vertex without intravenous contrast. RADIATION DOSE REDUCTION: This exam was performed according to the departmental dose-optimization program which includes automated exposure control, adjustment of the mA and/or kV according to patient size and/or use of iterative reconstruction technique. COMPARISON:  12/24/2020 FINDINGS: Brain: No acute intracranial findings are seen. There are no signs of bleeding. Ventricles are not dilated. There is no focal mass effect. Vascular: Unremarkable. Skull: No fracture is seen. Sinuses/Orbits: There is mucosal thickening in  the ethmoid, sphenoid and left maxillary sinuses. Other: None IMPRESSION: No acute intracranial findings are seen in noncontrast CT brain. Mild chronic sinusitis. Electronically Signed   By: Ernie Avena M.D.   On: 08/07/2021 09:38   CT CERVICAL SPINE WO CONTRAST  Result Date: 08/07/2021 CLINICAL DATA:  Trauma EXAM: CT CERVICAL SPINE WITHOUT CONTRAST TECHNIQUE: Multidetector CT imaging of the cervical spine was performed without intravenous contrast. Multiplanar CT image reconstructions were also generated. RADIATION DOSE REDUCTION: This exam was performed according to the departmental dose-optimization program which includes automated exposure control, adjustment of the mA and/or kV according to patient size and/or use of iterative reconstruction technique. COMPARISON:  12/24/2020 FINDINGS: Alignment: Alignment of posterior margins of vertebral bodies is unremarkable. Skull base and vertebrae: No recent fracture is seen. Soft tissues and spinal canal: There is no central spinal stenosis. Disc levels: There is no significant disc space narrowing. There is no significant encroachment of neural foramina. Upper chest: Unremarkable Other: No significant interval changes are noted. IMPRESSION: No recent fracture is seen in the cervical spine. Electronically Signed   By: Ernie Avena M.D.   On: 08/07/2021 09:41   EEG adult  Result Date: 08/07/2021 Charlsie Quest, MD     08/07/2021  1:21 PM Patient Name: Gerald Anderson MRN: [id number] Epilepsy Attending: Charlsie Quest Referring Physician/Provider: Ernie Avena, MD Date: 08/07/2021 Duration: 22.52 mins Patient history: 31 year old male with history of seizure disorder, marijuana use, alcohol use brought in by EMS from home after seizure today.  EEG to evaluate for seizure. Level of alertness: Awake, asleep AEDs during EEG study: Keppra, Ativan Technical aspects: This EEG study was done with scalp electrodes positioned according to the 10-20  International system of electrode placement. Electrical activity was acquired at a sampling rate of 500Hz  and reviewed with a high frequency filter of 70Hz  and a low frequency filter of 1Hz . EEG data were recorded continuously and digitally stored. Description: The posterior dominant rhythm consists of 9-10 Hz activity of moderate voltage (25-35 uV) seen predominantly in posterior head regions, symmetric and reactive to eye opening and eye closing. Sleep was characterized by vertex waves, sleep spindles (12 to 14 Hz), maximal frontocentral region. There is an excessive amount of 15 to 18 Hz beta activity distributed symmetrically and diffusely. Hyperventilation and photic stimulation were not performed.   ABNORMALITY - Excessive beta, generalized IMPRESSION: This study is within normal limits. The excessive beta activity seen in the background is most likely due to the effect of benzodiazepine and is a benign EEG pattern. No seizures or epileptiform discharges were seen throughout the recording. Priyanka Annabelle Harman    EKG: Independently reviewed.  Sinus arrhythmia with rate 98; LVH; no evidence of acute ischemia   Labs on Admission: I have personally reviewed the available labs and imaging studies at the time of the admission.  Pertinent labs:    CO2 20 Glucose 148 BUN 6/Creatinine 1.3/GFR >60 Anion gap 17 AST 174/ALT 96 Unremarkable CBC COVID/flu negative ETOH <10 UDS +  BZD only    Assessment and Plan: * Seizure [facility]) -Patient with known h/o seizures presenting with 2 seizures -The etiology appears to be medication noncompliance -Patient placed in observation overnight for further evaluation -Neurology has seen the patient -He was loaded with Keppra 4 grams and then started on 1000 mg IV BID for now -Will order EEG  -He also will need driving restriction for at least 6 months -Seizure precautions -Ativan prn  Uncontrolled hypertension -It also appears likely to that he has been  noncompliant with his BP medication (not taking his amlodipine) -Amlodipine is reordered, will add prn hydralazine -His creatinine is slightly higher than prior; while there may be an acute component here (anion gap was also increased), he is at significant risk of progressive CKD if he does not obtain good HTN control  LFT elevation -Uncertain etiology -Possibly related to ETOH use, as this is the usual distribution -Repeat CMP in AM -Recommend outpatient f/u     Advance Care Planning:   Code Status: Full Code   Consults: Neurology  DVT Prophylaxis: Lovenox  Family Communication: None present; I spoke with his mother by telephone at the time of admission  Severity of Illness: The appropriate patient status for this patient is OBSERVATION. Observation status is judged to be reasonable and necessary in order to provide the required intensity of service to ensure the patient's safety. The patient's presenting symptoms, physical exam findings, and initial radiographic and laboratory data in the context of their medical condition is felt to place them at decreased risk for further clinical deterioration. Furthermore, it is anticipated that the patient will be medically stable for discharge from the hospital within 2 midnights of admission.   Author: Jonah Blue, MD 08/07/2021 5:33 PM  For on call review [email or web address].

## 2021-08-07 NOTE — ED Triage Notes (Signed)
[facility] reports pt coming from home. GF found pt on floor postical. Pt does have hx of seizure but has not been taking medication for them. Upon arrival to ED pt vomiting. No IV access by EMS. ?

## 2021-08-07 NOTE — Assessment & Plan Note (Addendum)
Patient presenting to ED via EMS with recurrent seizures likely secondary to medication noncompliance.  Patient was loaded with IV Keppra 4 g followed by 1 g IV twice daily.  EEG with no seizure or epileptiform activity.  Patient's mental status now improved back to baseline.  Seen by neurology and follow-up with recommendations of restarting Keppra 500 mg p.o. twice daily and outpatient follow-up. ? ?

## 2021-08-07 NOTE — Assessment & Plan Note (Addendum)
Continue amlodipine 5 mg p.o. daily °

## 2021-08-07 NOTE — Progress Notes (Signed)
EEG complete - results pending 

## 2021-08-07 NOTE — ED Provider Notes (Signed)
?[facility] EM[facility] ?Provider Note ? ? ?CSN: [id number] ?Arrival date & time: 08/07/21  [id number] ? ?  ? ?History ? ?Chief Complaint  ?Patient presents with  ? Seizures  ? ? ?Gerald Anderson is a 31 y.o. male. ? ?31 year old male with history of seizure disorder, marijuana use, alcohol use brought in by EMS from home after seizure today.  EMS reports patient postictal with them, arrives in the emergency room vomiting.  Patient states that he remembers waking up this morning and going down the stairs in the next thing he remembers is arriving in the emergency room.  Patient states that he does not take his seizure medications, did not bite his tongue, unable to provide further history as he became unresponsive, screamed and then had episode of full body shaking lasting about a minute. ? ? ?  ? ?Home Medications ?Prior to Admission medications   ?Medication Sig Start Date End Date Taking? Authorizing Provider  ?amLODipine (NORVASC) 5 MG tablet Take 1 tablet (5 mg total) by mouth daily. 08/31/20   Kathlen ModyAkula, Vijaya, MD  ?dicyclomine (BENTYL) 20 MG tablet Take 1 tablet (20 mg total) by mouth 2 (two) times daily as needed for up to 16 doses for spasms. 11/28/20   Terald Sleeperrifan, Matthew J, MD  ?levETIRAcetam (KEPPRA) 500 MG tablet Take 1 tablet (500 mg total) by mouth 2 (two) times daily. 12/25/20   Catarina Hartshornat, David, MD  ?Menthol, Topical Analgesic, (ICY HOT BACK EX) Apply 1 application topically daily as needed (Back Pain).    [provider]  ?ondansetron (ZOFRAN ODT) 4 MG disintegrating tablet Take 1 tablet (4 mg total) by mouth every 8 (eight) hours as needed for nausea or vomiting. 03/21/21   Caccavale, Sophia, PA-C  ?   ? ?Allergies    ?Patient has no known allergies.   ? ?Review of Systems   ?Review of Systems ?Level 5 caveat applies as patient is unable to provide history due to seizure ?Physical Exam ?Updated Vital Signs ?BP (!) 141/95   Pulse (!) 106   Temp 98.2 ?F (36.8 ?C) (Oral)   Resp (!) 33    Ht 6\' 1"  (1.854 m)   Wt 68 kg   SpO2 100%   BMI 19.79 kg/m?  ?Physical Exam ?Vitals and nursing note reviewed.  ?Constitutional:   ?   General: He is not in acute distress. ?   Appearance: He is well-developed. He is not diaphoretic.  ?HENT:  ?   Head: Normocephalic and atraumatic.  ?Eyes:  ?   Pupils: Pupils are equal, round, and reactive to light.  ?Cardiovascular:  ?   Rate and Rhythm: Normal rate and regular rhythm.  ?   Pulses: Normal pulses.  ?   Heart sounds: Normal heart sounds.  ?Pulmonary:  ?   Effort: Pulmonary effort is normal.  ?   Breath sounds: Normal breath sounds.  ?Abdominal:  ?   Palpations: Abdomen is soft.  ?   Tenderness: There is no abdominal tenderness.  ?Musculoskeletal:     ?   General: No swelling, tenderness or deformity.  ?Skin: ?   General: Skin is warm and dry.  ?   Findings: No erythema or rash.  ?Neurological:  ?   General: No focal deficit present.  ?Psychiatric:     ?   Behavior: Behavior normal.  ? ? ?ED Results / Procedures / Treatments   ?Labs ?(all labs ordered are listed, but only abnormal results are displayed) ?Labs Reviewed  ?CBC WITH  DIFFERENTIAL/PLATELET - Abnormal; Notable for the following components:  ?    Result Value  ? RBC 4.12 (*)   ? Hemoglobin 12.9 (*)   ? All other components within normal limits  ?COMPREHENSIVE METABOLIC PANEL - Abnormal; Notable for the following components:  ? CO2 20 (*)   ? Glucose, Bld 148 (*)   ? Creatinine, Ser 1.30 (*)   ? Calcium 8.8 (*)   ? AST 174 (*)   ? ALT 96 (*)   ? Anion gap 17 (*)   ? All other components within normal limits  ?CBG MONITORING, ED - Abnormal; Notable for the following components:  ? Glucose-Capillary 166 (*)   ? All other components within normal limits  ?RESP PANEL BY RT-PCR (FLU A&B, COVID) [id number]  ?ETHANOL  ?RAPID URINE DRUG SCREEN, HOSP PERFORMED  ?CBG MONITORING, ED  ? ? ?EKG ?EKG Interpretation ? ?Date/Time:  Wednesday August 07 2021 08:57:27 EDT ?Ventricular Rate:  98 ?PR Interval:  153 ?QRS  Duration: 109 ?QT Interval:  364 ?QTC Calculation: 465 ?R Axis:   74 ?Text Interpretation: Sinus arrhythmia LAE, consider biatrial enlargement Left ventricular hypertrophy Confirmed by Ernie Avena ([id number]) on 08/07/2021 9:00:58 AM ? ?Radiology ?CT HEAD WO CONTRAST ? ?Result Date: 08/07/2021 ?CLINICAL DATA:  Trauma, altered mental status EXAM: CT HEAD WITHOUT CONTRAST TECHNIQUE: Contiguous axial images were obtained from the base of the skull through the vertex without intravenous contrast. RADIATION DOSE REDUCTION: This exam was performed according to the departmental dose-optimization program which includes automated exposure control, adjustment of the mA and/or kV according to patient size and/or use of iterative reconstruction technique. COMPARISON:  12/24/2020 FINDINGS: Brain: No acute intracranial findings are seen. There are no signs of bleeding. Ventricles are not dilated. There is no focal mass effect. Vascular: Unremarkable. Skull: No fracture is seen. Sinuses/Orbits: There is mucosal thickening in the ethmoid, sphenoid and left maxillary sinuses. Other: None IMPRESSION: No acute intracranial findings are seen in noncontrast CT brain. Mild chronic sinusitis. Electronically Signed   By: Ernie Avena M.D.   On: 08/07/2021 09:38  ? ?CT CERVICAL SPINE WO CONTRAST ? ?Result Date: 08/07/2021 ?CLINICAL DATA:  Trauma EXAM: CT CERVICAL SPINE WITHOUT CONTRAST TECHNIQUE: Multidetector CT imaging of the cervical spine was performed without intravenous contrast. Multiplanar CT image reconstructions were also generated. RADIATION DOSE REDUCTION: This exam was performed according to the departmental dose-optimization program which includes automated exposure control, adjustment of the mA and/or kV according to patient size and/or use of iterative reconstruction technique. COMPARISON:  12/24/2020 FINDINGS: Alignment: Alignment of posterior margins of vertebral bodies is unremarkable. Skull base and vertebrae: No recent  fracture is seen. Soft tissues and spinal canal: There is no central spinal stenosis. Disc levels: There is no significant disc space narrowing. There is no significant encroachment of neural foramina. Upper chest: Unremarkable Other: No significant interval changes are noted. IMPRESSION: No recent fracture is seen in the cervical spine. Electronically Signed   By: Ernie Avena M.D.   On: 08/07/2021 09:41   ? ?Procedures ?Procedures  ? ? ?Medications Ordered in ED ?Medications  ?LORazepam (ATIVAN) injection 2 mg (2 mg Intravenous Given 08/07/21 0910)  ?levETIRAcetam (KEPPRA) 4,000 mg in sodium chloride 0.9 % 250 mL IVPB (0 mg Intravenous Stopped 08/07/21 1012)  ?LORazepam (ATIVAN) injection 2 mg (2 mg Intravenous Given 08/07/21 0918)  ?LORazepam (ATIVAN) injection 4 mg (4 mg Intravenous Given 08/07/21 0919)  ?sodium chloride 0.9 % bolus 1,000 mL (1,000 mLs Intravenous New Bag/Given 08/07/21 1115)  ? ? ?  ED Course/ Medical Decision Making/ A&P ?  ?                        ?Medical Decision Making ?Amount and/or Complexity of Data Reviewed ?Labs: ordered. ?Radiology: ordered. ? ?Risk ?Prescription drug management. ?Decision regarding hospitalization. ? ? ?This patient presents to the ED for concern of seizure, this involves an extensive number of treatment options, and is a complaint that carries with it a high risk of complications and morbidity.  The differential diagnosis includes but not limited to seizure due to medication non compliance vs substance abuse, alcohol withdrawal, traumatic injury from fall leading to seizure, electrolyte disturbance  ? ? ?Co morbidities that complicate the patient evaluation ? ?Seizure disorder, non compliant with medication, alcohol and marijuana use.  ? ? ?Additional history obtained: ? ?Additional history obtained from EMS, postictal on arrival, CBG normal ?External records from outside source obtained and reviewed including discharge summary from 12/2020 admission for seizure,  EEG normal, dc on Keppra BID.  ? ? ?Lab Tests: ? ?I Ordered, and personally interpreted labs.  The pertinent results include:  CBC unremarkable, CMP gap 17, bicarb 20, glucose 148, elevated LFTs with AST 174 and ALT 96. E

## 2021-08-07 NOTE — ED Notes (Signed)
The pt is not alert enough for po meds or mouthwash ?

## 2021-08-07 NOTE — Progress Notes (Signed)
?   08/07/21 1806  ?Assess: MEWS Score  ?Temp 99.2 ?F (37.3 ?C)  ?BP (!) 144/101  ?Pulse Rate 84  ?ECG Heart Rate 84  ?Resp 13  ?Level of Consciousness Responds to Voice  ?SpO2 97 %  ?O2 Device Nasal Cannula  ?O2 Flow Rate (L/min) 2 L/min  ?Assess: MEWS Score  ?MEWS Temp 0  ?MEWS Systolic 0  ?MEWS Pulse 0  ?MEWS RR 1  ?MEWS LOC 1  ?MEWS Score 2  ?MEWS Score Color Yellow  ?Assess: if the MEWS score is Yellow or Red  ?Were vital signs taken at a resting state? Yes  ?Focused Assessment No change from prior assessment  ?Early Detection of Sepsis Score *See Row Information* Low  ?MEWS guidelines implemented *See Row Information* Yes  ?Take Vital Signs  ?Increase Vital Sign Frequency  Yellow: Q 2hr X 2 then Q 4hr X 2, if remains yellow, continue Q 4hrs  ?Notify: Charge Nurse/RN  ?Name of Charge Nurse/RN Notified French Ana RN  ?Date Charge Nurse/RN Notified 08/07/21  ?Time Charge Nurse/RN Notified 1808  ? ? ?

## 2021-08-07 NOTE — Progress Notes (Signed)
RT note. ?Patient transported to CT and back on NRB without any complications. RT will continue to monitor. ?

## 2021-08-07 NOTE — ED Notes (Signed)
EEG at bedside.

## 2021-08-08 ENCOUNTER — Observation Stay (HOSPITAL_COMMUNITY): Payer: Self-pay

## 2021-08-08 ENCOUNTER — Other Ambulatory Visit (HOSPITAL_COMMUNITY): Payer: Self-pay

## 2021-08-08 LAB — COMPREHENSIVE METABOLIC PANEL
ALT: 75 U/L — ABNORMAL HIGH (ref 0–44)
AST: 114 U/L — ABNORMAL HIGH (ref 15–41)
Albumin: 3.6 g/dL (ref 3.5–5.0)
Alkaline Phosphatase: 42 U/L (ref 38–126)
Anion gap: 10 (ref 5–15)
BUN: <5 mg/dL — ABNORMAL LOW (ref 6–20)
CO2: 27 mmol/L (ref 22–32)
Calcium: 8.7 mg/dL — ABNORMAL LOW (ref 8.9–10.3)
Chloride: 102 mmol/L (ref 98–111)
Creatinine, Ser: 0.92 mg/dL (ref 0.61–1.24)
GFR, Estimated: >60 mL/min (ref >60)
Glucose, Bld: 78 mg/dL (ref 70–99)
Potassium: 3.3 mmol/L — ABNORMAL LOW (ref 3.5–5.1)
Sodium: 139 mmol/L (ref 135–145)
Total Bilirubin: 1.1 mg/dL (ref 0.3–1.2)
Total Protein: 6.3 g/dL — ABNORMAL LOW (ref 6.5–8.1)

## 2021-08-08 LAB — CBC
HCT: 37.5 % — ABNORMAL LOW (ref 39.0–52.0)
Hemoglobin: 12.2 g/dL — ABNORMAL LOW (ref 13.0–17.0)
MCH: 31 pg (ref 26.0–34.0)
MCHC: 32.5 g/dL (ref 30.0–36.0)
MCV: 95.2 fL (ref 80.0–100.0)
Platelets: 147 10*3/uL — ABNORMAL LOW (ref 150–400)
RBC: 3.94 MIL/uL — ABNORMAL LOW (ref 4.22–5.81)
RDW: 12.3 % (ref 11.5–15.5)
WBC: 6.4 10*3/uL (ref 4.0–10.5)
nRBC: 0 % (ref 0.0–0.2)

## 2021-08-08 MED ORDER — AMLODIPINE BESYLATE 5 MG PO TABS
5.0000 mg | ORAL_TABLET | Freq: Every day | ORAL | 2 refills | Status: DC
  Filled 2021-08-08: qty 30, 30d supply, fill #0

## 2021-08-08 MED ORDER — METHOCARBAMOL 500 MG PO TABS: 500.0000 mg | ORAL_TABLET | Freq: Four times a day (QID) | ORAL | Status: DC | PRN

## 2021-08-08 MED ORDER — LEVETIRACETAM 500 MG PO TABS
500.0000 mg | ORAL_TABLET | Freq: Two times a day (BID) | ORAL | 2 refills | Status: DC
  Filled 2021-08-08: qty 60, 30d supply, fill #0

## 2021-08-08 MED ORDER — LEVETIRACETAM 500 MG PO TABS
500.0000 mg | ORAL_TABLET | Freq: Two times a day (BID) | ORAL | Status: DC
  Administered 2021-08-08: 500 mg via ORAL
  Filled 2021-08-08: qty 1

## 2021-08-08 MED ORDER — POTASSIUM CHLORIDE CRYS ER 20 MEQ PO TBCR
40.0000 meq | EXTENDED_RELEASE_TABLET | Freq: Once | ORAL | Status: AC
  Administered 2021-08-08: 40 meq via ORAL
  Filled 2021-08-08: qty 2

## 2021-08-08 MED ORDER — METHOCARBAMOL 500 MG PO TABS
500.0000 mg | ORAL_TABLET | Freq: Four times a day (QID) | ORAL | 0 refills | Status: DC | PRN
  Filled 2021-08-08: qty 20, 5d supply, fill #0

## 2021-08-08 NOTE — Progress Notes (Signed)
Neurology Progress Note ? ? ?S:// ?Patient is sitting up talking on the phone in NAD. RN is at the bedside and reports no new neurological events overnight. No family at the bedside. Patient tells me he was hospitalized 2/18-2/19 for seizures at [facility]. He also tells me that he has not been taking his seizure medication due to the inability to afford his medication due to lack of insurance and a job. He endorses drinking 2-3 shots of liquor/day and denies drug use ? ? ?O:// ?Current vital signs: ?BP (!) 144/97 (BP Location: Left Arm)   Pulse 79   Temp 98.6 ?F (37 ?C) (Oral)   Resp 17   Ht 6\' 1"  (1.854 m)   Wt 68 kg   SpO2 100%   BMI 19.79 kg/m?  ?Vital signs in last 24 hours: ?Temp:  [98.3 ?F (36.8 ?C)-99.2 ?F (37.3 ?C)] 98.6 ?F (37 ?C) (03/15 2317) ?Pulse Rate:  [73-106] 79 (03/15 2317) ?Resp:  [13-33] 17 (03/15 2317) ?BP: (136-166)/(84-119) 144/97 (03/15 2317) ?SpO2:  [97 %-100 %] 100 % (03/15 2317) ? ?GENERAL: Awake, alert in NAD on RA  ?HEENT: - Normocephalic and atraumatic, dry mm ?LUNGS - Clear to auscultation bilaterally with no wheezes ?CV - S1S2 RRR, no m/r/g, equal pulses bilaterally. ?ABDOMEN - Soft, nontender, nondistended with normoactive BS ?Ext: Has scar on left shoulder, neck scar. warm, well perfused, intact peripheral pulses, no edema.  ? ?NEURO:  ?Mental Status: AA&Ox4 ?Language: speech is clear.  Naming, repetition, fluency, and comprehension intact. ?Cranial Nerves: PERRL 2 mm/brisk. EOMI, visual fields full, no facial asymmetry, facial sensation intact, hearing intact, tongue/uvula/soft palate midline, normal sternocleidomastoid and trapezius muscle strength. No evidence of tongue atrophy or fibrillations ?Motor: 5/5 in all 4 extremities ?Tone: is normal and bulk is normal ?Sensation- Intact to light touch bilaterally ?Coordination: FTN intact bilaterally, no ataxia in BLE. ?Gait- deferred ? ? ?Medications ? ?Current Facility-Administered Medications:  ?  acetaminophen  (TYLENOL) tablet 650 mg, 650 mg, Oral, Q4H PRN **OR** acetaminophen (TYLENOL) suppository 650 mg, 650 mg, Rectal, Q4H PRN, 12-07-1991, MD, 650 mg at 08/07/21 2043 ?  amLODipine (NORVASC) tablet 5 mg, 5 mg, Oral, Daily, 2044, MD, 5 mg at 08/08/21 0955 ?  enoxaparin (LOVENOX) injection 40 mg, 40 mg, Subcutaneous, Q24H, 08/10/21, MD, 40 mg at 08/07/21 2043 ?  hydrALAZINE (APRESOLINE) injection 5 mg, 5 mg, Intravenous, Q4H PRN, 2044, MD ?  levETIRAcetam (KEPPRA) tablet 500 mg, 500 mg, Oral, BID, Jonah Blue, [place], DO, 500 mg at 08/08/21 08/10/21 ?  LORazepam (ATIVAN) injection 4 mg, 4 mg, Intravenous, Q5 Min x 2 PRN, 0981, MD ?  ondansetron [facility]) tablet 4 mg, 4 mg, Oral, Q6H PRN **OR** ondansetron (ZOFRAN) injection 4 mg, 4 mg, Intravenous, Q6H PRN, [facility], MD ?  sodium chloride flush (NS) 0.9 % injection 3 mL, 3 mL, Intravenous, Q12H, Jonah Blue, MD, 3 mL at 08/07/21 2151 ? ?Imaging ?I have reviewed images in epic and the results pertinent to this consultation are: ? ?CT-scan of the brain 3/15 ?No acute process  ? ?CT C-spine 3/15: ?No acute process  ? ?rEEG 3/15: This study is within normal limits. The excessive beta activity seen in the background is most likely due to the effect of benzodiazepine and is a benign EEG pattern. No seizures or epileptiform discharges were seen throughout the recording ? ?Assessment:  ?Gerald Anderson is a 31 y.o. male with past medical history of seizures with noncompliance of medications who  presents to the [facility] ED via EMS for evaluation of seizures at home. On arrival to the ED patient vomited and became unresponsive, screamed and then had an  episode of full body shaking lasting about a minute. Prior to episode he stated that he does not take his AEDs. He received 8mg  IV ativan and 4gm Keppra ? ?Impression: ?Breakthrough seizures in the setting of medication noncompliance ? ?Recommendations: ?- Continue seizure Precautions ?-  Continue Keppra 500mg  BID (decreased by primary team) ?- TOC consult for assistance with meds ?- Will need to follow up with outpatient neurologist  ?- Neurology will signoff. Please call with questions or concerns  ? ?SEIZURE PRECAUTIONS ?Per [facility] statutes, patients with seizures are not allowed to drive until they have been seizure-free for six months.  ? ?Use caution when using heavy equipment or power tools. Avoid working on ladders or at heights. Take showers instead of baths. Ensure the water temperature is not too high on the home water heater. Do not go swimming alone. Do not lock yourself in a room alone (i.e. bathroom). When caring for infants or small children, sit down when holding, feeding, or changing them to minimize risk of injury to the child in the event you have a seizure. Maintain good sleep hygiene. Avoid alcohol.  ?  ?If patient has another seizure, call 911 and bring them back to the ED if: ?A.  The seizure lasts longer than 5 minutes.      ?B.  The patient doesn't wake shortly after the seizure or has new problems such as difficulty seeing, speaking or moving following the seizure ?C.  The patient was injured during the seizure ?D.  The patient has a temperature over 102 F (39C) ?E.  The patient vomited during the seizure and now is having trouble breathing  ? ? DNP, [id number]  ?  ?

## 2021-08-08 NOTE — TOC Transition Note (Signed)
Transition of Care (TOC) - CM/SW Discharge Note ? ? ?Patient Details  ?Name: Gerald Anderson ?MRN: [id number] ?Date of Birth: 04/01/1991 ? ?Transition of Care (TOC) CM/SW Contact:  ?Pollie Friar, RN ?Phone Number: ?08/08/2021, 11:48 AM ? ? ?Clinical Narrative:    ?Patient discharging home with self care. Pt without insurance or PCP. Pt interested in getting set up with one of the [facility]. Cm has obtained an appointment at [facility] with the information on his AVS.  ?Discharge medications covered with the [facility] program and will be delivered to his room per [place].  ?Pt is moving in about a month to [place] and will f/u with his health care and medicaid there.  ?Pt has transport home today.  ? ? ?Final next level of care: Home/Self Care ?Barriers to Discharge: Inadequate or no insurance, Barriers Unresolved (comment) ? ? ?Patient Goals and CMS Choice ?  ?  ?  ? ?Discharge Placement ?  ?           ?  ?  ?  ?  ? ?Discharge Plan and Services ?  ?  ?           ?  ?  ?  ?  ?  ?  ?  ?  ?  ?  ? ?Social Determinants of Health (SDOH) Interventions ?  ? ? ?Readmission Risk Interventions ?No flowsheet data found. ? ? ? ? ?

## 2021-08-08 NOTE — Progress Notes (Signed)
Discharge instructions, Rx's and follow up appts explained and provided to patient. Patient left floor via wheelchair accompanied by staff. No c/o pain or shortness of breath at d/c. ? ?TOC delivered RX's to room prior to d/c. ? ?Jerolyn Flenniken, Kae Heller, RN ? ?

## 2021-08-08 NOTE — Hospital Course (Addendum)
Deacon Gadbois is a 31 year old male with past medical history significant for seizure disorder and hypertension who presented to [facility] ED via EMS on 3/15 2023 recurrent seizure.  According to EMS, patient has not been taking his seizure medications.  Patient was postictal and unable to provide history at the time of admission.  Mother also reports that he likely has not been taking his medications. ? ?In the ED, temperature 98.2 ?F, HR 96, RR 16, BP 159/97, SPO2 96% on room air.  WBC count 8.0, hemoglobin 12.9, platelets 173.  Sodium 138, potassium 3.7, chloride 101, CO2 20, glucose 148, BUN 6, creatinine 1.30.  AST 174, ALT 96.  Total bilirubin 0.3.  COVID-19 PCR negative.  Influenza A/B PCR negative.  EtOH level less than 10.  UDS positive for benzodiazepines.  CT head without contrast with no acute intracranial findings.  CT C-spine with no recent fracture in the cervical spine.  Neurology was consulted; recommend admission for EEG.  Patient was loaded on IV Keppra.  TRH consulted for further evaluation and management of recurrent seizure in the setting of medication noncompliance outpatient. ?

## 2021-08-08 NOTE — Discharge Summary (Signed)
Physician Discharge Summary  Cloude Bittel ZOX:[id number] DOB: Jan 26, 1991 DOA: 08/07/2021  PCP: Patient, No Pcp Per (Inactive)  Admit date: 08/07/2021 Discharge date: 08/08/2021  Admitted From: Home Disposition: Home  Recommendations for Outpatient Follow-up:  Follow up with PCP in 1-2 weeks We will need to establish care with a neurologist outpatient Keppra 500 mg p.o. twice daily, refilled Amlodipine 5 mg p.o. daily, refilled Continue to encourage compliance with medication regimen as this is leading etiology to his hospitalization Please obtain CMP 1 week to assess improvement in LFTs  Home Health: No Equipment/Devices: None  Discharge Condition: Stable CODE STATUS: Full code Diet recommendation: Heart healthy diet  History of present illness:  Gerald Anderson is a 31 year old male with past medical history significant for seizure disorder and hypertension who presented to [facility] ED via EMS on 3/15 2023 recurrent seizure.  According to EMS, patient has not been taking his seizure medications.  Patient was postictal and unable to provide history at the time of admission.  Mother also reports that he likely has not been taking his medications.  In the ED, temperature 98.2 F, HR 96, RR 16, BP 159/97, SPO2 96% on room air.  WBC count 8.0, hemoglobin 12.9, platelets 173.  Sodium 138, potassium 3.7, chloride 101, CO2 20, glucose 148, BUN 6, creatinine 1.30.  AST 174, ALT 96.  Total bilirubin 0.3.  COVID-19 PCR negative.  Influenza A/B PCR negative.  EtOH level less than 10.  UDS positive for benzodiazepines.  CT head without contrast with no acute intracranial findings.  CT C-spine with no recent fracture in the cervical spine.  Neurology was consulted; recommend admission for EEG.  Patient was loaded on IV Keppra.  TRH consulted for further evaluation and management of recurrent seizure in the setting of medication noncompliance outpatient.  Hospital course:  Assessment and Plan: *  Seizure [facility]) Patient presenting to ED via EMS with recurrent seizures likely secondary to medication noncompliance.  Patient was loaded with IV Keppra 4 g followed by 1 g IV twice daily.  EEG with no seizure or epileptiform activity.  Patient's mental status now improved back to baseline.  Seen by neurology and follow-up with recommendations of restarting Keppra 500 mg p.o. twice daily and outpatient follow-up.   LFT elevation Etiology likely secondary to EtOH abuse.  LFTs were trended and were improving at time of discharge.  Recommend alcohol cessation.  Outpatient follow-up with PCP with repeat CMP 1 week  Uncontrolled hypertension Continue amlodipine 5 mg p.o. daily.       Discharge Diagnoses:  Principal Problem:   Seizure [facility]) Active Problems:   LFT elevation   Uncontrolled hypertension    Discharge Instructions  Discharge Instructions     Diet - low sodium heart healthy   Complete by: As directed    Increase activity slowly   Complete by: As directed       Allergies as of 08/08/2021   No Known Allergies      Medication List     STOP taking these medications    dicyclomine 20 MG tablet Commonly known as: BENTYL   ondansetron 4 MG disintegrating tablet Commonly known as: Zofran ODT       TAKE these medications    amLODipine 5 MG tablet Commonly known as: NORVASC Take 1 tablet (5 mg total) by mouth daily.   ICY HOT BACK EX Apply 1 application topically daily as needed (Back Pain).   levETIRAcetam 500 MG tablet Commonly known as: KEPPRA Take 1 tablet (  500 mg total) by mouth 2 (two) times daily.        No Known Allergies  Consultations: Neurology   Procedures/Studies: CT HEAD WO CONTRAST  Result Date: 08/07/2021 CLINICAL DATA:  Trauma, altered mental status EXAM: CT HEAD WITHOUT CONTRAST TECHNIQUE: Contiguous axial images were obtained from the base of the skull through the vertex without intravenous contrast. RADIATION DOSE REDUCTION:  This exam was performed according to the departmental dose-optimization program which includes automated exposure control, adjustment of the mA and/or kV according to patient size and/or use of iterative reconstruction technique. COMPARISON:  12/24/2020 FINDINGS: Brain: No acute intracranial findings are seen. There are no signs of bleeding. Ventricles are not dilated. There is no focal mass effect. Vascular: Unremarkable. Skull: No fracture is seen. Sinuses/Orbits: There is mucosal thickening in the ethmoid, sphenoid and left maxillary sinuses. Other: None IMPRESSION: No acute intracranial findings are seen in noncontrast CT brain. Mild chronic sinusitis. Electronically Signed   By: Ernie Avena M.D.   On: 08/07/2021 09:38   CT CERVICAL SPINE WO CONTRAST  Result Date: 08/07/2021 CLINICAL DATA:  Trauma EXAM: CT CERVICAL SPINE WITHOUT CONTRAST TECHNIQUE: Multidetector CT imaging of the cervical spine was performed without intravenous contrast. Multiplanar CT image reconstructions were also generated. RADIATION DOSE REDUCTION: This exam was performed according to the departmental dose-optimization program which includes automated exposure control, adjustment of the mA and/or kV according to patient size and/or use of iterative reconstruction technique. COMPARISON:  12/24/2020 FINDINGS: Alignment: Alignment of posterior margins of vertebral bodies is unremarkable. Skull base and vertebrae: No recent fracture is seen. Soft tissues and spinal canal: There is no central spinal stenosis. Disc levels: There is no significant disc space narrowing. There is no significant encroachment of neural foramina. Upper chest: Unremarkable Other: No significant interval changes are noted. IMPRESSION: No recent fracture is seen in the cervical spine. Electronically Signed   By: Ernie Avena M.D.   On: 08/07/2021 09:41   EEG adult  Result Date: 08/07/2021 Charlsie Quest, MD     08/07/2021  1:21 PM Patient Name:  Kethan Mamon MRN: [id number] Epilepsy Attending: Charlsie Quest Referring Physician/Provider: Ernie Avena, MD Date: 08/07/2021 Duration: 22.52 mins Patient history: 31 year old male with history of seizure disorder, marijuana use, alcohol use brought in by EMS from home after seizure today.  EEG to evaluate for seizure. Level of alertness: Awake, asleep AEDs during EEG study: Keppra, Ativan Technical aspects: This EEG study was done with scalp electrodes positioned according to the 10-20 International system of electrode placement. Electrical activity was acquired at a sampling rate of 500Hz  and reviewed with a high frequency filter of 70Hz  and a low frequency filter of 1Hz . EEG data were recorded continuously and digitally stored. Description: The posterior dominant rhythm consists of 9-10 Hz activity of moderate voltage (25-35 uV) seen predominantly in posterior head regions, symmetric and reactive to eye opening and eye closing. Sleep was characterized by vertex waves, sleep spindles (12 to 14 Hz), maximal frontocentral region. There is an excessive amount of 15 to 18 Hz beta activity distributed symmetrically and diffusely. Hyperventilation and photic stimulation were not performed.   ABNORMALITY - Excessive beta, generalized IMPRESSION: This study is within normal limits. The excessive beta activity seen in the background is most likely due to the effect of benzodiazepine and is a benign EEG pattern. No seizures or epileptiform discharges were seen throughout the recording. Priyanka Annabelle Harman     Subjective: Patient seen examined bedside, resting comfortably.  Stating "I do not want any further test".  Also reported that he needs a letter for his lawyer due to missed court date while he was hospitalized.  Discussed with patient needs to comply with his medication regimen as this is leading factor to his recurrent hospitalizations and seizures.  Discussed with patient needs to follow-up with his PCP and  establish care with a neurologist; states he is about to move to [place] so no need to establish care locally here in [place].  Seen by neurology this morning and now signed off with recommending continued Keppra 500 mg twice daily.  Patient with no other complaints or concerns at this time.  Denies headache, no dizziness, no chest pain, no shortness of breath, no abdominal pain.  No acute events overnight per nursing staff.  Discharge Exam: Vitals:   08/07/21 1950 08/07/21 2317  BP: (!) 166/100 (!) 144/97  Pulse: 87 79  Resp: 18 17  Temp: 98.3 F (36.8 C) 98.6 F (37 C)  SpO2: 100% 100%   Vitals:   08/07/21 1750 08/07/21 1806 08/07/21 1950 08/07/21 2317  BP: (!) 159/100 (!) 144/101 (!) 166/100 (!) 144/97  Pulse: 73 84 87 79  Resp: 13 13 18 17   Temp: 99.2 F (37.3 C) 99.2 F (37.3 C) 98.3 F (36.8 C) 98.6 F (37 C)  TempSrc: Axillary Axillary Oral Oral  SpO2: 100% 97% 100% 100%  Weight:      Height:        Physical Exam: GEN: NAD, alert and oriented x 3, wd/wn HEENT: NCAT, PERRL, EOMI, sclera clear, MMM PULM: CTAB w/o wheezes/crackles, normal respiratory effort, on room air CV: RRR w/o M/G/R GI: abd soft, NTND, NABS, no R/G/M MSK: no peripheral edema, muscle strength globally intact 5/5 bilateral upper/lower extremities NEURO: CN II-XII intact, no focal deficits, sensation to light touch intact PSYCH: normal mood/affect Integumentary: dry/intact, no rashes or wounds    The results of significant diagnostics from this hospitalization (including imaging, microbiology, ancillary and laboratory) are listed below for reference.     Microbiology: Recent Results (from the past 240 hour(s))  Resp Panel by RT-PCR (Flu A&B, Covid) Nasopharyngeal Swab     Status: None   Collection Time: 08/07/21 10:37 AM   Specimen: Nasopharyngeal Swab; Nasopharyngeal(NP) swabs in vial transport medium  Result Value Ref Range Status   SARS Coronavirus 2 by RT PCR NEGATIVE NEGATIVE Final     Comment: (NOTE) SARS-CoV-2 target nucleic acids are NOT DETECTED.  The SARS-CoV-2 RNA is generally detectable in upper respiratory specimens during the acute phase of infection. The lowest concentration of SARS-CoV-2 viral copies this assay can detect is 138 copies/mL. A negative result does not preclude SARS-Cov-2 infection and should not be used as the sole basis for treatment or other patient management decisions. A negative result may occur with  improper specimen collection/handling, submission of specimen other than nasopharyngeal swab, presence of viral mutation(s) within the areas targeted by this assay, and inadequate number of viral copies(<138 copies/mL). A negative result must be combined with clinical observations, patient history, and epidemiological information. The expected result is Negative.  Fact Sheet for Patients:  BloggerCourse.com  Fact Sheet for Healthcare Providers:  SeriousBroker.it  This test is no t yet approved or cleared by the [place] FDA and  has been authorized for detection and/or diagnosis of SARS-CoV-2 by FDA under an Emergency Use Authorization (EUA). This EUA will remain  in effect (meaning this test can be used) for the duration of the  COVID-19 declaration under Section 564(b)(1) of the Act, 21 U.S.C.section 360bbb-3(b)(1), unless the authorization is terminated  or revoked sooner.       Influenza A by PCR NEGATIVE NEGATIVE Final   Influenza B by PCR NEGATIVE NEGATIVE Final    Comment: (NOTE) The Xpert Xpress SARS-CoV-2/FLU/RSV plus assay is intended as an aid in the diagnosis of influenza from Nasopharyngeal swab specimens and should not be used as a sole basis for treatment. Nasal washings and aspirates are unacceptable for Xpert Xpress SARS-CoV-2/FLU/RSV testing.  Fact Sheet for Patients: BloggerCourse.com  Fact Sheet for Healthcare  Providers: SeriousBroker.it  This test is not yet approved or cleared by the [place] FDA and has been authorized for detection and/or diagnosis of SARS-CoV-2 by FDA under an Emergency Use Authorization (EUA). This EUA will remain in effect (meaning this test can be used) for the duration of the COVID-19 declaration under Section 564(b)(1) of the Act, 21 U.S.C. section 360bbb-3(b)(1), unless the authorization is terminated or revoked.  Performed at [facility], [address], [place], [place] [address]      Labs: BNP (last 3 results) No results for input(s): BNP in the last 8760 hours. Basic Metabolic Panel: Recent Labs  Lab 08/07/21 1001 08/08/21 0352  NA 138 139  K 3.7 3.3*  CL 101 102  CO2 20* 27  GLUCOSE 148* 78  BUN 6 <5*  CREATININE 1.30* 0.92  CALCIUM 8.8* 8.7*   Liver Function Tests: Recent Labs  Lab 08/07/21 1001 08/08/21 0352  AST 174* 114*  ALT 96* 75*  ALKPHOS 48 42  BILITOT 0.3 1.1  PROT 6.8 6.3*  ALBUMIN 4.1 3.6   No results for input(s): LIPASE, AMYLASE in the last 168 hours. No results for input(s): AMMONIA in the last 168 hours. CBC: Recent Labs  Lab 08/07/21 0900 08/08/21 0352  WBC 8.0 6.4  NEUTROABS 6.5  --   HGB 12.9* 12.2*  HCT 40.4 37.5*  MCV 98.1 95.2  PLT 173 147*   Cardiac Enzymes: No results for input(s): CKTOTAL, CKMB, CKMBINDEX, TROPONINI in the last 168 hours. BNP: Invalid input(s): POCBNP CBG: Recent Labs  Lab 08/07/21 0936  GLUCAP 166*   D-Dimer No results for input(s): DDIMER in the last 72 hours. Hgb A1c No results for input(s): HGBA1C in the last 72 hours. Lipid Profile No results for input(s): CHOL, HDL, LDLCALC, TRIG, CHOLHDL, LDLDIRECT in the last 72 hours. Thyroid function studies No results for input(s): TSH, T4TOTAL, T3FREE, THYROIDAB in the last 72 hours.  Invalid input(s): FREET3 Anemia work up No results for input(s): VITAMINB12, FOLATE, FERRITIN, TIBC,  IRON, RETICCTPCT in the last 72 hours. Urinalysis    Component Value Date/Time   COLORURINE YELLOW 03/20/2018 1435   APPEARANCEUR CLEAR 03/20/2018 1435   LABSPEC 1.019 03/20/2018 1435   PHURINE 5.0 03/20/2018 1435   GLUCOSEU NEGATIVE 03/20/2018 1435   HGBUR NEGATIVE 03/20/2018 1435   BILIRUBINUR NEGATIVE 03/20/2018 1435   KETONESUR 20 (A) 03/20/2018 1435   PROTEINUR NEGATIVE 03/20/2018 1435   UROBILINOGEN 0.2 07/19/2014 0720   NITRITE NEGATIVE 03/20/2018 1435   LEUKOCYTESUR NEGATIVE 03/20/2018 1435   Sepsis Labs Invalid input(s): PROCALCITONIN,  WBC,  LACTICIDVEN Microbiology Recent Results (from the past 240 hour(s))  Resp Panel by RT-PCR (Flu A&B, Covid) Nasopharyngeal Swab     Status: None   Collection Time: 08/07/21 10:37 AM   Specimen: Nasopharyngeal Swab; Nasopharyngeal(NP) swabs in vial transport medium  Result Value Ref Range Status   SARS Coronavirus 2 by  RT PCR NEGATIVE NEGATIVE Final    Comment: (NOTE) SARS-CoV-2 target nucleic acids are NOT DETECTED.  The SARS-CoV-2 RNA is generally detectable in upper respiratory specimens during the acute phase of infection. The lowest concentration of SARS-CoV-2 viral copies this assay can detect is 138 copies/mL. A negative result does not preclude SARS-Cov-2 infection and should not be used as the sole basis for treatment or other patient management decisions. A negative result may occur with  improper specimen collection/handling, submission of specimen other than nasopharyngeal swab, presence of viral mutation(s) within the areas targeted by this assay, and inadequate number of viral copies(<138 copies/mL). A negative result must be combined with clinical observations, patient history, and epidemiological information. The expected result is Negative.  Fact Sheet for Patients:  BloggerCourse.com  Fact Sheet for Healthcare Providers:  SeriousBroker.it  This test is no t  yet approved or cleared by the [place] FDA and  has been authorized for detection and/or diagnosis of SARS-CoV-2 by FDA under an Emergency Use Authorization (EUA). This EUA will remain  in effect (meaning this test can be used) for the duration of the COVID-19 declaration under Section 564(b)(1) of the Act, 21 U.S.C.section 360bbb-3(b)(1), unless the authorization is terminated  or revoked sooner.       Influenza A by PCR NEGATIVE NEGATIVE Final   Influenza B by PCR NEGATIVE NEGATIVE Final    Comment: (NOTE) The Xpert Xpress SARS-CoV-2/FLU/RSV plus assay is intended as an aid in the diagnosis of influenza from Nasopharyngeal swab specimens and should not be used as a sole basis for treatment. Nasal washings and aspirates are unacceptable for Xpert Xpress SARS-CoV-2/FLU/RSV testing.  Fact Sheet for Patients: BloggerCourse.com  Fact Sheet for Healthcare Providers: SeriousBroker.it  This test is not yet approved or cleared by the [place] FDA and has been authorized for detection and/or diagnosis of SARS-CoV-2 by FDA under an Emergency Use Authorization (EUA). This EUA will remain in effect (meaning this test can be used) for the duration of the COVID-19 declaration under Section 564(b)(1) of the Act, 21 U.S.C. section 360bbb-3(b)(1), unless the authorization is terminated or revoked.  Performed at [facility], [address], [place], [place] [address]      Time coordinating discharge: Over 30 minutes  SIGNED:   Alvira Philips [place], DO  [facility] 08/08/2021, 11:26 AM

## 2021-08-08 NOTE — Plan of Care (Signed)
Pt is alert oriented x 4. Pt has IV fluids going. IV keppra administered per order. Pts family present at bedside. Seizure precautions in place. Pt did complain of back pain, prn tylenol given per order. No distress noted. Pt resting with eyes closed. Pt is due to void.  ? ?Problem: Education: ?Goal: Expressions of having a comfortable level of knowledge regarding the disease process will increase ?Outcome: Progressing ?  ?Problem: Coping: ?Goal: Ability to adjust to condition or change in health will improve ?Outcome: Progressing ?Goal: Ability to identify appropriate support needs will improve ?Outcome: Progressing ?  ?Problem: Health Behavior/Discharge Planning: ?Goal: Compliance with prescribed medication regimen will improve ?Outcome: Progressing ?  ?Problem: Medication: ?Goal: Risk for medication side effects will decrease ?Outcome: Progressing ?  ?Problem: Clinical Measurements: ?Goal: Complications related to the disease process, condition or treatment will be avoided or minimized ?Outcome: Progressing ?Goal: Diagnostic test results will improve ?Outcome: Progressing ?  ?Problem: Self-Concept: ?Goal: Level of anxiety will decrease ?Outcome: Progressing ?Goal: Ability to verbalize feelings about condition will improve ?Outcome: Progressing ?  ?

## 2021-08-08 NOTE — Plan of Care (Signed)
°  Problem: Education: °Goal: Expressions of having a comfortable level of knowledge regarding the disease process will increase °Outcome: Adequate for Discharge °  °Problem: Coping: °Goal: Ability to adjust to condition or change in health will improve °Outcome: Adequate for Discharge °Goal: Ability to identify appropriate support needs will improve °Outcome: Adequate for Discharge °  °Problem: Health Behavior/Discharge Planning: °Goal: Compliance with prescribed medication regimen will improve °Outcome: Adequate for Discharge °  °Problem: Medication: °Goal: Risk for medication side effects will decrease °Outcome: Adequate for Discharge °  °Problem: Clinical Measurements: °Goal: Complications related to the disease process, condition or treatment will be avoided or minimized °Outcome: Adequate for Discharge °Goal: Diagnostic test results will improve °Outcome: Adequate for Discharge °  °Problem: Safety: °Goal: Verbalization of understanding the information provided will improve °Outcome: Adequate for Discharge °  °Problem: Self-Concept: °Goal: Level of anxiety will decrease °Outcome: Adequate for Discharge °Goal: Ability to verbalize feelings about condition will improve °Outcome: Adequate for Discharge °  °

## 2021-08-08 NOTE — Discharge Instructions (Signed)
Per [place] DMV statutes, patients with seizures are not allowed to drive until they have been seizure-free for six months.    Use caution when using heavy equipment or power tools. Avoid working on ladders or at heights. Take showers instead of baths. Ensure the water temperature is not too high on the home water heater. Do not go swimming alone. Do not lock yourself in a room alone (i.e. bathroom). When caring for infants or small children, sit down when holding, feeding, or changing them to minimize risk of injury to the child in the event you have a seizure. Maintain good sleep hygiene. Avoid alcohol.    If patient has another seizure, call 911 and bring them back to the ED if: A.  The seizure lasts longer than 5 minutes.      B.  The patient doesn't wake shortly after the seizure or has new problems such as difficulty seeing, speaking or moving following the seizure C.  The patient was injured during the seizure D.  The patient has a temperature over 102 F (39C) E.  The patient vomited during the seizure and now is having trouble breathing  

## 2021-08-22 ENCOUNTER — Inpatient Hospital Stay (INDEPENDENT_AMBULATORY_CARE_PROVIDER_SITE_OTHER): Payer: Self-pay | Admitting: Primary Care

## 2021-08-29 ENCOUNTER — Emergency Department (HOSPITAL_COMMUNITY): Payer: Self-pay

## 2021-08-29 ENCOUNTER — Observation Stay (HOSPITAL_COMMUNITY)
Admission: EM | Admit: 2021-08-29 | Discharge: 2021-08-30 | Disposition: A | Discharge status: Home / Self Care | Payer: Medicaid - Out of State | Attending: Internal Medicine | Admitting: Internal Medicine

## 2021-08-29 ENCOUNTER — Encounter (HOSPITAL_COMMUNITY): Payer: Self-pay | Admitting: Internal Medicine

## 2021-08-29 DIAGNOSIS — I10 Essential (primary) hypertension: Secondary | ICD-10-CM | POA: Diagnosis present

## 2021-08-29 DIAGNOSIS — Z79899 Other long term (current) drug therapy: Secondary | ICD-10-CM | POA: Insufficient documentation

## 2021-08-29 DIAGNOSIS — F1010 Alcohol abuse, uncomplicated: Secondary | ICD-10-CM | POA: Diagnosis present

## 2021-08-29 DIAGNOSIS — R9431 Abnormal electrocardiogram [ECG] [EKG]: Secondary | ICD-10-CM | POA: Diagnosis present

## 2021-08-29 DIAGNOSIS — E876 Hypokalemia: Secondary | ICD-10-CM | POA: Insufficient documentation

## 2021-08-29 DIAGNOSIS — R569 Unspecified convulsions: Secondary | ICD-10-CM

## 2021-08-29 DIAGNOSIS — G40909 Epilepsy, unspecified, not intractable, without status epilepticus: Secondary | ICD-10-CM | POA: Diagnosis not present

## 2021-08-29 LAB — CBC WITH DIFFERENTIAL/PLATELET
Abs Immature Granulocytes: 0.05 10*3/uL (ref 0.00–0.07)
Basophils Absolute: 0 10*3/uL (ref 0.0–0.1)
Basophils Relative: 0 %
Eosinophils Absolute: 0 10*3/uL (ref 0.0–0.5)
Eosinophils Relative: 0 %
HCT: 45.9 % (ref 39.0–52.0)
Hemoglobin: 14.3 g/dL (ref 13.0–17.0)
Immature Granulocytes: 0 %
Lymphocytes Relative: 12 %
Lymphs Abs: 1.6 10*3/uL (ref 0.7–4.0)
MCH: 31.6 pg (ref 26.0–34.0)
MCHC: 31.2 g/dL (ref 30.0–36.0)
MCV: 101.5 fL — ABNORMAL HIGH (ref 80.0–100.0)
Monocytes Absolute: 1 10*3/uL (ref 0.1–1.0)
Monocytes Relative: 8 %
Neutro Abs: 10.2 10*3/uL — ABNORMAL HIGH (ref 1.7–7.7)
Neutrophils Relative %: 80 %
Platelets: 224 10*3/uL (ref 150–400)
RBC: 4.52 MIL/uL (ref 4.22–5.81)
RDW: 12.2 % (ref 11.5–15.5)
WBC: 12.9 10*3/uL — ABNORMAL HIGH (ref 4.0–10.5)
nRBC: 0 % (ref 0.0–0.2)

## 2021-08-29 LAB — COMPREHENSIVE METABOLIC PANEL
ALT: 42 U/L (ref 0–44)
AST: 81 U/L — ABNORMAL HIGH (ref 15–41)
Albumin: 4.6 g/dL (ref 3.5–5.0)
Alkaline Phosphatase: 53 U/L (ref 38–126)
Anion gap: 24 — ABNORMAL HIGH (ref 5–15)
BUN: 10 mg/dL (ref 6–20)
CO2: 11 mmol/L — ABNORMAL LOW (ref 22–32)
Calcium: 9.4 mg/dL (ref 8.9–10.3)
Chloride: 106 mmol/L (ref 98–111)
Creatinine, Ser: 1.28 mg/dL — ABNORMAL HIGH (ref 0.61–1.24)
GFR, Estimated: >60 mL/min (ref >60)
Glucose, Bld: 179 mg/dL — ABNORMAL HIGH (ref 70–99)
Potassium: 3.3 mmol/L — ABNORMAL LOW (ref 3.5–5.1)
Sodium: 141 mmol/L (ref 135–145)
Total Bilirubin: 0.8 mg/dL (ref 0.3–1.2)
Total Protein: 7.6 g/dL (ref 6.5–8.1)

## 2021-08-29 LAB — ETHANOL: Alcohol, Ethyl (B): <10 mg/dL (ref <10)

## 2021-08-29 LAB — CBG MONITORING, ED: Glucose-Capillary: 171 mg/dL — ABNORMAL HIGH (ref 70–99)

## 2021-08-29 LAB — PROTIME-INR
INR: 1.2 (ref 0.8–1.2)
Prothrombin Time: 15.2 seconds (ref 11.4–15.2)

## 2021-08-29 LAB — AMMONIA: Ammonia: 39 umol/L — ABNORMAL HIGH (ref 9–35)

## 2021-08-29 LAB — MAGNESIUM: Magnesium: 2 mg/dL (ref 1.7–2.4)

## 2021-08-29 MED ORDER — POTASSIUM CHLORIDE CRYS ER 20 MEQ PO TBCR
40.0000 meq | EXTENDED_RELEASE_TABLET | Freq: Two times a day (BID) | ORAL | Status: DC
Start: 2021-08-29 — End: 2021-08-30
  Administered 2021-08-29 – 2021-08-30 (×2): 40 meq via ORAL
  Filled 2021-08-29 (×2): qty 2

## 2021-08-29 MED ORDER — DEXTROSE-NACL 5-0.9 % IV SOLN: INTRAVENOUS | Status: DC

## 2021-08-29 MED ORDER — AMLODIPINE BESYLATE 5 MG PO TABS
5.0000 mg | ORAL_TABLET | Freq: Every day | ORAL | Status: DC
  Administered 2021-08-29 – 2021-08-30 (×2): 5 mg via ORAL
  Filled 2021-08-29 (×2): qty 1

## 2021-08-29 MED ORDER — LEVETIRACETAM 500 MG PO TABS
500.0000 mg | ORAL_TABLET | Freq: Two times a day (BID) | ORAL | Status: DC
  Administered 2021-08-30: 500 mg via ORAL
  Filled 2021-08-29: qty 1

## 2021-08-29 MED ORDER — LACTATED RINGERS IV BOLUS
500.0000 mL | Freq: Once | INTRAVENOUS | Status: AC
  Administered 2021-08-29: 500 mL via INTRAVENOUS

## 2021-08-29 MED ORDER — ACETAMINOPHEN 325 MG PO TABS
650.0000 mg | ORAL_TABLET | Freq: Four times a day (QID) | ORAL | Status: DC | PRN
  Administered 2021-08-29: 650 mg via ORAL
  Filled 2021-08-29: qty 2

## 2021-08-29 MED ORDER — LORAZEPAM 2 MG/ML IJ SOLN: 1.0000 mg | INTRAMUSCULAR | Status: DC | PRN

## 2021-08-29 MED ORDER — THIAMINE HCL 100 MG/ML IJ SOLN
100.0000 mg | Freq: Every day | INTRAMUSCULAR | Status: DC
  Administered 2021-08-29 – 2021-08-30 (×2): 100 mg via INTRAVENOUS
  Filled 2021-08-29 (×2): qty 2

## 2021-08-29 MED ORDER — MAGNESIUM SULFATE 2 GM/50ML IV SOLN
2.0000 g | INTRAVENOUS | Status: AC
  Administered 2021-08-29: 2 g via INTRAVENOUS
  Filled 2021-08-29: qty 50

## 2021-08-29 MED ORDER — ACETAMINOPHEN 650 MG RE SUPP: 650.0000 mg | Freq: Four times a day (QID) | RECTAL | Status: DC | PRN

## 2021-08-29 MED ORDER — LORAZEPAM 2 MG/ML IJ SOLN
INTRAMUSCULAR | Status: AC
  Filled 2021-08-29: qty 1

## 2021-08-29 MED ORDER — LEVETIRACETAM IN NACL 1000 MG/100ML IV SOLN
1000.0000 mg | Freq: Once | INTRAVENOUS | Status: AC
  Administered 2021-08-29: 1000 mg via INTRAVENOUS
  Filled 2021-08-29: qty 100

## 2021-08-29 MED ORDER — LEVETIRACETAM IN NACL 500 MG/100ML IV SOLN
500.0000 mg | Freq: Once | INTRAVENOUS | Status: AC
  Administered 2021-08-30: 500 mg via INTRAVENOUS
  Filled 2021-08-29: qty 100

## 2021-08-29 MED ORDER — LORAZEPAM 2 MG/ML IJ SOLN
2.0000 mg | Freq: Once | INTRAMUSCULAR | Status: AC
  Administered 2021-08-29: 2 mg via INTRAVENOUS

## 2021-08-29 NOTE — ED Provider Notes (Signed)
I assumed care of patient at shift change from previous team, please see note from previous team for full H and P.   ?Briefly patient is here for evaluation of seizure. ?Witnessed seizure prior to arrival and then had another one here.  He is reportedly been noncompliant with his Keppra and there is a question of potential alcohol withdrawal. ? ?ED Course / MDM  ? ?Clinical Course as of 08/29/21 2200  ?Thu Aug 29, 2021  ?1457 CBC with Differential/Platelet(!) [AH]  ?1503 WBC(!): 12.9 [AH]  ?1503 Ethanol ?Patient given Keppra loading dose.  He is still very somnolent and not back to baseline but answering some questions. ?Negative alcohol level [AH]  ?1520 EKG 12-Lead ?KG shows sinus tachycardia at a rate of 117 with markedly prolonged QT.  I have ordered magnesium and thiamine dose [AH]  ?1612 Patient reevaluated, he is still somnolent, he awakens and is able to briefly answer questions before falling back asleep. [EH]  ?  ?Clinical Course User Index ?[AH] Margarita Mail, PA-C ?[EH] Lorin Glass, PA-C  ? ?Medical Decision Making ?At the time of shift change plan established by previous team is to follow-up on remaining labs, CT scan of head and neck anticipating admission. ?Patient was reevaluated, he remains somnolent and able to awaken and answer questions before falling back asleep however is not at his baseline. ?He will require admission. ?I spoke with hospitalist who will see him for admission. ? ? ? ?Amount and/or Complexity of Data Reviewed ?Labs: ordered. Decision-making details documented in ED Course. ?Radiology: ordered. ?ECG/medicine tests:  Decision-making details documented in ED Course. ? ?Risk ?Prescription drug management. ?Decision regarding hospitalization. ? ? ?Note: Portions of this report may have been transcribed using voice recognition software. Every effort was made to ensure accuracy; however, inadvertent computerized transcription errors may be present ? ? ? ? ? ?  ?Lorin Glass, [place] ?08/29/21 2202 ? ?  ?Lucrezia Starch, MD ?08/29/21 2224 ? ?

## 2021-08-29 NOTE — ED Triage Notes (Signed)
Pt to ED via EMS from home c/o seizure . Hx seizure. On EMS arrival pt post ictal. Pt non compliant with seizure medication. Seizure was not witnessed,pt gf was in the home, but asleep. Pt was in the bed when seizure occurred, and was in bed on EMS arrival. Pt did not fall . Did not hit head head. Old hematoma to left side of head and bruising/redness right eye- which per gf occurred yesterday. Pt in c-collar; c/o neck pain and head pain. Hx alcoholism - last drink last night. #20 R Forearm. A&O X 4 Currently. No medications given by EMS. LAST VS: 140/60, HR102, 96%, CBG 124 ?

## 2021-08-29 NOTE — ED Notes (Signed)
Hospitalist at bedside 

## 2021-08-29 NOTE — ED Notes (Signed)
Help change patient out of his wet clothes changed patient sheets and got patient some warm blankets patient is resting with call bell in reach  ?

## 2021-08-29 NOTE — H&P (Signed)
?History and Physical  ? ? ?PatientLuz Anderson: Gerald Anderson QMV:[id number] DOB: 09/17/1990 ?DOA: 08/29/2021 ?DOS: the patient was seen and examined on 08/29/2021 ?PCP: Patient, No Pcp Per (Inactive)  ?Patient coming from: Home; NOK: Mother, Gerald Anderson, [phone] ? ? ?Chief Complaint: Seizure, witnessed at home and in the ER. ? ?HPI: Gerald Anderson is a 31 y.o. male with medical history significant of seizure disorder and HTN presenting with seizure.  According to EMS, he has not been taking his seizure medications.  EMS was called by his girlfriend who was at home but asleep.  He was seizing in bed , on EMS arrival he was postictal.  Put on c-collar.  Brought to the ER.  Patient is not taking his medications.  Admitted with a status epilepticus about a month ago and discharged on Keppra 500 mg twice daily.  Noncompliant.   ?Patient was postictal and also had received 1 mg of Ativan on my evaluation, however he was able to keep up conversation.  Patient tells me that he had seizures last month, he was prescribed Keppra but he did not take them because he thought he will get over them.  Patient on my interview only complains of some neck pain and body ache but denies any headache, nausea vomiting, chest pain palpitations.  Patient tells me that he did not have any seizure since last hospitalization until today.  Denies any aggravating factors. ? ?1 witnessed seizure episode by ER physician with patient became unresponsive, darting his eyes back and forth rapidly and not responding to voice or painful stimulus.  Lasted about 1 minute, became rigid and went into tonic-clonic seizure for about 45 seconds aborted with Ativan IV. ? ? ?ER Course: Hemodynamically stable.  Electrolytes fairly normal.  Potassium slightly low.  Seizure aborted.  Postictal now.  Loaded with Keppra 1 g.  Needing observation overnight. ? ? ? ? ?Review of Systems: unable to review all systems due to the inability of the patient to answer questions. ?Past  Medical History:  ?Diagnosis Date  ? Back pain   ? Kawasaki disease (HCC)   ? Seizures (HCC)   ? ?History reviewed. No pertinent surgical history. ?Social History:  reports that he has never smoked. He has never used smokeless tobacco. He reports current alcohol use. He reports that he does not use drugs. ? ?No Known Allergies ? ?History reviewed. No pertinent family history.  No family history of seizure. ? ?Prior to Admission medications   ?Medication Sig Start Date End Date Taking? Authorizing Provider  ?amLODipine (NORVASC) 5 MG tablet Take 1 tablet (5 mg total) by mouth daily. 08/31/20   Kathlen ModyAkula, Vijaya, MD  ?dicyclomine (BENTYL) 20 MG tablet Take 1 tablet (20 mg total) by mouth 2 (two) times daily as needed for up to 16 doses for spasms. 11/28/20   Terald Sleeperrifan, Matthew J, MD  ?levETIRAcetam (KEPPRA) 500 MG tablet Take 1 tablet (500 mg total) by mouth 2 (two) times daily. 12/25/20   Catarina Hartshornat, David, MD  ?Menthol, Topical Analgesic, (ICY HOT BACK EX) Apply 1 application topically daily as needed (Back Pain).    [provider]  ?ondansetron (ZOFRAN ODT) 4 MG disintegrating tablet Take 1 tablet (4 mg total) by mouth every 8 (eight) hours as needed for nausea or vomiting. 03/21/21   Caccavale, Sophia, PA-C  ? ? ?Physical Exam: ?Vitals:  ? 08/29/21 1515 08/29/21 1615 08/29/21 1645 08/29/21 1700  ?BP: 139/71 134/80 (!) 133/95 (!) 132/93  ?Pulse: 83 87 87 89  ?Resp: 12  15 (!) 8 10  ?Temp:      ?TempSrc:      ?SpO2: 100% 100% 100% 99%  ?Weight:      ?Height:      ? ?General: Looks comfortable.  Mostly sleepy but interactive. ?Cardiovascular: S1-S2 normal.  Regular rate rhythm. ?Respiratory: Bilateral clear.  No added sounds. ?Gastrointestinal: Soft.  Nontender.  Bowel sound present. ?Ext: No edema or cyanosis.  No swelling.  No deformity.  No trauma. ?Neuro: Alert and oriented x4 but goes back to sleep.  No neurological deficits.  No tremors. ?Musculoskeletal: No deformities.  No joint swelling. ?Skin: Intact. ?Psych: Sleepy.   In normal mood. ? ? ? ? ? ?Radiological Exams on Admission: ? ?CT Head Wo Contrast ? ?Result Date: 08/29/2021 ?CLINICAL DATA:  Seizure.  Neck injury. EXAM: CT HEAD WITHOUT CONTRAST CT CERVICAL SPINE WITHOUT CONTRAST TECHNIQUE: Multidetector CT imaging of the head and cervical spine was performed following the standard protocol without intravenous contrast. Multiplanar CT image reconstructions of the cervical spine were also generated. RADIATION DOSE REDUCTION: This exam was performed according to the departmental dose-optimization program which includes automated exposure control, adjustment of the mA and/or kV according to patient size and/or use of iterative reconstruction technique. COMPARISON:  August 07, 2021. FINDINGS: CT HEAD FINDINGS Brain: No evidence of acute infarction, hemorrhage, hydrocephalus, extra-axial collection or mass lesion/mass effect. Vascular: No hyperdense vessel or unexpected calcification. Skull: Normal. Negative for fracture or focal lesion. Sinuses/Orbits: No acute finding. Other: None. CT CERVICAL SPINE FINDINGS Alignment: Normal. Skull base and vertebrae: No acute fracture. No primary bone lesion or focal pathologic process. Soft tissues and spinal canal: No prevertebral fluid or swelling. No visible canal hematoma. Disc levels:  Normal. Upper chest: Negative. Other: None. IMPRESSION: No acute intracranial abnormality seen. No definite abnormality seen in the cervical spine. Electronically Signed   By: Lupita Raider M.D.   On: 08/29/2021 16:07  ? ?CT Cervical Spine Wo Contrast ? ?Result Date: 08/29/2021 ?CLINICAL DATA:  Seizure.  Neck injury. EXAM: CT HEAD WITHOUT CONTRAST CT CERVICAL SPINE WITHOUT CONTRAST TECHNIQUE: Multidetector CT imaging of the head and cervical spine was performed following the standard protocol without intravenous contrast. Multiplanar CT image reconstructions of the cervical spine were also generated. RADIATION DOSE REDUCTION: This exam was performed according to  the departmental dose-optimization program which includes automated exposure control, adjustment of the mA and/or kV according to patient size and/or use of iterative reconstruction technique. COMPARISON:  August 07, 2021. FINDINGS: CT HEAD FINDINGS Brain: No evidence of acute infarction, hemorrhage, hydrocephalus, extra-axial collection or mass lesion/mass effect. Vascular: No hyperdense vessel or unexpected calcification. Skull: Normal. Negative for fracture or focal lesion. Sinuses/Orbits: No acute finding. Other: None. CT CERVICAL SPINE FINDINGS Alignment: Normal. Skull base and vertebrae: No acute fracture. No primary bone lesion or focal pathologic process. Soft tissues and spinal canal: No prevertebral fluid or swelling. No visible canal hematoma. Disc levels:  Normal. Upper chest: Negative. Other: None. IMPRESSION: No acute intracranial abnormality seen. No definite abnormality seen in the cervical spine. Electronically Signed   By: Lupita Raider M.D.   On: 08/29/2021 16:07   ? ?EKG: Independently reviewed.  Sinus tachycardia.  QTc 620.  Will repeat. ? ? ? ?Labs on Admission: I have personally reviewed the available labs and imaging studies at the time of the admission. ? ?Pertinent labs: Potassium 3.3, magnesium normal.  Creatinine 1.28.  Ammonia 39.  WBC count 12.9.  Urine drug screen pending. ? ? ? ? ?  Assessment and Plan: ?No notes have been filed under this hospital service. ?Service: Hospitalist ? ?Recurrent seizure, breakthrough seizure in a patient with known seizure disorder: ?Admit to progressive bed.  Seizure and fall precautions. ?Keppra 1 g loaded in the ER, resume Keppra 500 mg twice daily.  Will provide IV if unable to take by mouth. ?Already diagnosed but noncompliant. ?Ativan as needed to abort seizures. ?Counseling and education. ?Patient agrees to take seizure medication now.  If he remains uneventful overnight, will discharge on oral Keppra 500 twice daily and advised neurology  follow-up. ? ?Essential hypertension: Blood pressures fairly normal today.  Resume amlodipine. ? ?Hypokalemia: We will replace and monitor levels. ? ?QTc prolongation: With recent normal QTc.  Sinus tachycardia.

## 2021-08-29 NOTE — ED Provider Notes (Signed)
?[facility] EMERGENCY DEPARTMENT ?Provider Note ? ? ?CSN: [id number] ?Arrival date & time: 08/29/21  1315 ? ?  ? ?History ? ?Chief Complaint  ?Patient presents with  ? Seizures  ? ? ?Gerald Anderson is a 31 y.o. male who arrives for seizure.  History given by EMS.  According EMS the patient was in bed with his girlfriend when she heard him making noise and he had seizure.  He was postictal at the time of EMS arrival.  Patient was on the bed and did not fall or hit his head.  However he arrives in C-color and has some conjunctival hemorrhage on the right which apparently occurred yesterday and hematoma to the left side of the head.  Patient was recently admitted and discharged on 08/08/2021 for seizure, elevated LFTs and not on compliance with medications.  LFT elevation was thought to be secondary to alcohol misuse.  Patient's girlfriend reports that he was discharged with Keppra but has not been taking the medication.  His last alcohol intake was last night. ? ?HPI ? ?  ? ?Home Medications ?Prior to Admission medications   ?Medication Sig Start Date End Date Taking? Authorizing Provider  ?amLODipine (NORVASC) 5 MG tablet Take 1 tablet (5 mg total) by mouth daily. 08/08/21 11/06/21  [place], Eric J, DO  ?levETIRAcetam (KEPPRA) 500 MG tablet Take 1 tablet (500 mg total) by mouth 2 (two) times daily. 08/08/21 11/06/21  [place], Eric J, DO  ?Menthol, Topical Analgesic, (ICY HOT BACK EX) Apply 1 application topically daily as needed (Back Pain).    [provider]  ?methocarbamol (ROBAXIN) 500 MG tablet Take 1 tablet (500 mg total) by mouth every 6 (six) hours as needed for muscle spasms. 08/08/21   [place], Eric J, DO  ?   ? ?Allergies    ?Patient has no known allergies.   ? ?Review of Systems   ?Review of Systems ? ?Physical Exam ?Updated Vital Signs ?BP 136/82   Pulse 87   Temp 98.1 ?F (36.7 ?C) (Axillary)   Resp 13   Ht 6\' 1"  (1.854 m)   Wt 68 kg   SpO2 100%   BMI 19.79 kg/m?  ?Physical  Exam ?Vitals and nursing note reviewed.  ?Constitutional:   ?   General: He is not in acute distress. ?   Appearance: He is well-developed. He is not diaphoretic.  ?HENT:  ?   Head: Normocephalic and atraumatic.  ?Eyes:  ?   General: No scleral icterus. ?   Conjunctiva/sclera: Conjunctivae normal.  ?Cardiovascular:  ?   Rate and Rhythm: Normal rate and regular rhythm.  ?   Heart sounds: Normal heart sounds.  ?Pulmonary:  ?   Effort: Pulmonary effort is normal. No respiratory distress.  ?   Breath sounds: Normal breath sounds.  ?Abdominal:  ?   Palpations: Abdomen is soft.  ?   Tenderness: There is no abdominal tenderness.  ?Musculoskeletal:  ?   Cervical back: Normal range of motion and neck supple.  ?Skin: ?   General: Skin is warm and dry.  ?Neurological:  ?   Mental Status: He is alert.  ?   Comments: Patient somnolent but arousable, answering questions appropriately.  During examination patient became unresponsive and began darting his eyes back and forth rapidly.  He would not respond to voice or painful stimulus.  This lasted for approximately 1 minute before he became rigid and went into a tonic-clonic seizure for approximately 45 seconds.  ?Psychiatric:     ?  Behavior: Behavior normal.  ? ? ?ED Results / Procedures / Treatments   ?Labs ?(all labs ordered are listed, but only abnormal results are displayed) ?Labs Reviewed  ?COMPREHENSIVE METABOLIC PANEL - Abnormal; Notable for the following components:  ?    Result Value  ? Potassium 3.3 (*)   ? CO2 11 (*)   ? Glucose, Bld 179 (*)   ? Creatinine, Ser 1.28 (*)   ? AST 81 (*)   ? Anion gap 24 (*)   ? All other components within normal limits  ?CBC WITH DIFFERENTIAL/PLATELET - Abnormal; Notable for the following components:  ? WBC 12.9 (*)   ? MCV 101.5 (*)   ? Neutro Abs 10.2 (*)   ? All other components within normal limits  ?CBG MONITORING, ED - Abnormal; Notable for the following components:  ? Glucose-Capillary 171 (*)   ? All other components within  normal limits  ?MAGNESIUM  ?ETHANOL  ?PROTIME-INR  ?RAPID URINE DRUG SCREEN, HOSP PERFORMED  ?AMMONIA  ? ? ?EKG ?None ? ?Radiology ?No results found. ? ?Procedures ?Procedures  ? ?Medications Ordered in ED ?Medications  ?LORazepam (ATIVAN) 2 MG/ML injection (has no administration in time range)  ?thiamine (B-1) injection 100 mg (has no administration in time range)  ?magnesium sulfate IVPB 2 g 50 mL (has no administration in time range)  ?LORazepam (ATIVAN) injection 2 mg (2 mg Intravenous Given 08/29/21 1329)  ?levETIRAcetam (KEPPRA) IVPB 1000 mg/100 mL premix (0 mg Intravenous Stopped 08/29/21 1430)  ? ? ?ED Course/ Medical Decision Making/ A&P ?Clinical Course as of 08/29/21 1608  ?Thu Aug 29, 2021  ?1457 CBC with Differential/Platelet(!) [AH]  ?1503 WBC(!): 12.9 [AH]  ?1503 Ethanol ?Patient given Keppra loading dose.  He is still very somnolent and not back to baseline but answering some questions. ?Negative alcohol level [AH]  ?1520 EKG 12-Lead ?KG shows sinus tachycardia at a rate of 117 with markedly prolonged QT.  I have ordered magnesium and thiamine dose [AH]  ?  ?Clinical Course User Index ?[AH] Arthor Captain, PA-C  ? ?                        ?Medical Decision Making ?31 year old male here with seizure.  History of the same, history of alcohol misuse disorder reported in previous EMR although I have not verified this with either the patient or family.  Patient had 2 witnessed seizures today will need to be admitted.  Currently awaiting the remainder of the patient's labs and imaging to return.  I have given signout to PA [place] at shift change.  ? ?Amount and/or Complexity of Data Reviewed ?Labs: ordered. Decision-making details documented in ED Course. ?Radiology: ordered. ?ECG/medicine tests:  Decision-making details documented in ED Course. ? ?Risk ?Prescription drug management. ? ? ? ? ? ? ?Final Clinical Impression(s) / ED Diagnoses ?Final diagnoses:  ?None  ? ? ?Rx / DC Orders ?ED Discharge Orders    ? ? None  ? ?  ? ? ?  ?Arthor Captain, PA-C ?08/29/21 1608 ? ?  ?Rolan Bucco, MD ?08/30/21 [phone] ? ?

## 2021-08-29 NOTE — ED Notes (Signed)
Pt actively seizing . This RN and Abby PA at bedside  ?

## 2021-08-29 NOTE — ED Notes (Signed)
Help change patient sheets got patient out of his wet shorts  ?

## 2021-08-30 ENCOUNTER — Other Ambulatory Visit (HOSPITAL_COMMUNITY): Payer: Self-pay

## 2021-08-30 LAB — BASIC METABOLIC PANEL
Anion gap: 8 (ref 5–15)
BUN: 8 mg/dL (ref 6–20)
CO2: 24 mmol/L (ref 22–32)
Calcium: 8.5 mg/dL — ABNORMAL LOW (ref 8.9–10.3)
Chloride: 109 mmol/L (ref 98–111)
Creatinine, Ser: 1.38 mg/dL — ABNORMAL HIGH (ref 0.61–1.24)
GFR, Estimated: >60 mL/min (ref >60)
Glucose, Bld: 101 mg/dL — ABNORMAL HIGH (ref 70–99)
Potassium: 4.1 mmol/L (ref 3.5–5.1)
Sodium: 141 mmol/L (ref 135–145)

## 2021-08-30 LAB — CBC WITH DIFFERENTIAL/PLATELET
Abs Immature Granulocytes: 0.02 10*3/uL (ref 0.00–0.07)
Basophils Absolute: 0 10*3/uL (ref 0.0–0.1)
Basophils Relative: 0 %
Eosinophils Absolute: 0 10*3/uL (ref 0.0–0.5)
Eosinophils Relative: 0 %
HCT: 40 % (ref 39.0–52.0)
Hemoglobin: 13.2 g/dL (ref 13.0–17.0)
Immature Granulocytes: 0 %
Lymphocytes Relative: 12 %
Lymphs Abs: 1 10*3/uL (ref 0.7–4.0)
MCH: 31.3 pg (ref 26.0–34.0)
MCHC: 33 g/dL (ref 30.0–36.0)
MCV: 94.8 fL (ref 80.0–100.0)
Monocytes Absolute: 1.1 10*3/uL — ABNORMAL HIGH (ref 0.1–1.0)
Monocytes Relative: 13 %
Neutro Abs: 6.4 10*3/uL (ref 1.7–7.7)
Neutrophils Relative %: 75 %
Platelets: 161 10*3/uL (ref 150–400)
RBC: 4.22 MIL/uL (ref 4.22–5.81)
RDW: 12.4 % (ref 11.5–15.5)
WBC: 8.6 10*3/uL (ref 4.0–10.5)
nRBC: 0 % (ref 0.0–0.2)

## 2021-08-30 LAB — RAPID URINE DRUG SCREEN, HOSP PERFORMED
Amphetamines: NOT DETECTED
Barbiturates: NOT DETECTED
Benzodiazepines: NOT DETECTED
Cocaine: NOT DETECTED
Opiates: NOT DETECTED
Tetrahydrocannabinol: NOT DETECTED

## 2021-08-30 LAB — PHOSPHORUS: Phosphorus: 3.5 mg/dL (ref 2.5–4.6)

## 2021-08-30 LAB — MAGNESIUM: Magnesium: 2.3 mg/dL (ref 1.7–2.4)

## 2021-08-30 MED ORDER — LEVETIRACETAM 500 MG PO TABS: 500.0000 mg | ORAL_TABLET | Freq: Two times a day (BID) | ORAL | 2 refills | Status: DC

## 2021-08-30 MED ORDER — AMLODIPINE BESYLATE 5 MG PO TABS: 5.0000 mg | ORAL_TABLET | Freq: Every day | ORAL | 2 refills | Status: AC

## 2021-08-30 NOTE — TOC Transition Note (Signed)
Transition of Care (TOC) - CM/SW Discharge Note ? ? ?Patient Details  ?Name: Gerald Anderson ?MRN: [id number] ?Date of Birth: 04/23/1991 ? ?Transition of Care (TOC) CM/SW Contact:  ?Pollie Friar, RN ?Phone Number: ?08/30/2021, 12:16 PM ? ? ?Clinical Narrative:    ?Patient is discharging home with self care. Pt was set up with one of the [place] at last admission but never attended. CM has placed information on his AVS for making an appointment at [facility].  ?Pt has out of state medicaid that should cover his medications.  ?Pt is moving to [place] soon.  ?Pt has transportation home.  ? ? ?Final next level of care: Home/Self Care ?Barriers to Discharge: No Barriers Identified ? ? ?Patient Goals and CMS Choice ?  ?  ?  ? ?Discharge Placement ?  ?           ?  ?  ?  ?  ? ?Discharge Plan and Services ?  ?  ?           ?  ?  ?  ?  ?  ?  ?  ?  ?  ?  ? ?Social Determinants of Health (SDOH) Interventions ?  ? ? ?Readmission Risk Interventions ?   ? View : No data to display.  ?  ?  ?  ? ? ? ? ? ?

## 2021-08-30 NOTE — Progress Notes (Signed)
Patient vomited previously ingested food, moderate amount. BP taken it was 161/107. Denies headache and abdominal pain. Messaged MD via amion. ? ?Offered ginger ale and crackers. At 2241, patient was able to take his medicine. No complaints and no vomiting episode noted again. BP rechecked it was 104/84. Will keep monitor.  ?

## 2021-08-30 NOTE — Progress Notes (Signed)
Discharge instructions given. Patient verbalized understanding and all questions were answered.  ?

## 2021-08-30 NOTE — Progress Notes (Signed)
Patient refused wheelchair. Staff member walked outside. Per staff member, patient did well.  ?

## 2021-08-30 NOTE — Discharge Summary (Addendum)
Physician Discharge Summary  ?Gerald Anderson MCN:[id number] DOB: 10/02/1990 DOA: 08/29/2021 ? ?PCP: Patient, No Pcp Per (Inactive) ? ?Admit date: 08/29/2021 ?Discharge date: 08/30/2021 ? ?Admitted From: Home ?Disposition: Home ? ?Recommendations for Outpatient Follow-up:  ?Follow up with PCP in 1-2 weeks ?We will send referral to neurology for follow-up. ?You have a strict advised not to drive until seizure-free for 6 months. ? ?Home Health: N/A ?Equipment/Devices: N/A ? ?Discharge Condition: Stable ?CODE STATUS: Full code ?Diet recommendation: Low-salt diet ? ?Discharge summary: ?31 year old gentleman with history of essential hypertension not on treatment, seizure disorder noncompliant to treatment admitted with 2 episodes of seizure, one at home and 1 in the emergency room witnessed by ER physician where he had tonic-clonic seizure.  Recently admitted to the hospital with tonic-clonic seizure and suspected to status epilepticus, discharged with Keppra 500 mg twice daily, medications were given to him at the bedside from transitional care pharmacy however he did not take any medicine because he is thinking he will grow out of it.  Patient explained that he was sleeping in the bed, he felt numb and tight, fell out of the bed and woke up with EMS.  Patient still does not believe he had seizures though they were witnessed in the hospital last month and this month. ?Brought to the emergency room, he was postictal.  Seizure were aborted.  He was loaded with Keppra and then continued on home doses of Keppra 500 mg twice daily.  No overnight events.  Currently asymptomatic. ? ?Plan: ?Seizure medications Keppra 500 mg twice daily to continue uninterrupted, will send referral to [facility] neurology but patient tells me that he is going moved to [place] within weeks.  Advised him to keep seizure medicine with him all the time and take it. ?He is not taking medicine for blood pressures, will prescribe amlodipine. ?Given [facility] recommendations and driving restrictions. ? ?Addendum, repeat EKG was done to verify QTc interval from EKG yesterday.  His EKG shows QTc of 444 which is quite normal and comparable to previous EKGs.  Should not have any contraindication to continue Keppra. ? ?Discharge Diagnoses:  ?Principal Problem: ?  Recurrent seizures (HCC) ?Active Problems: ?  Essential hypertension ?  Alcohol abuse ?  Prolonged QT interval ? ? ? ?Discharge Instructions ? ?Discharge Instructions   ? ? Ambulatory referral to Neurology   Complete by: As directed ?  ? An appointment is requested in approximately: 4 weeks  ? Diet - low sodium heart healthy   Complete by: As directed ?  ? Discharge instructions   Complete by: As directed ?  ? Recommended [place] DMV statutes, patients with seizures are not allowed to drive until they have been seizure-free for six months. Use caution when using heavy equipment or power tools. Avoid working on ladders or at heights. Take showers instead of baths. Ensure the water temperature is not too high on the home water heater. Do not go swimming alone. Do not lock yourself in a room alone (i.e. bathroom). Maintain good sleep hygiene. Avoid alcohol.  ? Driving Restrictions   Complete by: As directed ?  ? Until cleared by neurology in follow up visit  ? Increase activity slowly   Complete by: As directed ?  ? ?  ? ?Allergies as of 08/30/2021   ?No Known Allergies ?  ? ?  ?Medication List  ?  ? ?STOP taking these medications   ? ?methocarbamol 500 MG tablet ?Commonly known as: ROBAXIN ?  ? ?  ? ?  TAKE these medications   ? ?acetaminophen 325 MG tablet ?Commonly known as: TYLENOL ?Take 325-650 mg by mouth daily as needed (pain). ?  ?amLODipine 5 MG tablet ?Commonly known as: NORVASC ?Take 1 tablet (5 mg total) by mouth daily. ?  ?ibuprofen 800 MG tablet ?Commonly known as: ADVIL ?Take 800 mg by mouth daily as needed (pain). ?  ?levETIRAcetam 500 MG tablet ?Commonly known as: KEPPRA ?Take 1 tablet (500 mg  total) by mouth 2 (two) times daily. ?  ? ?  ? ? Follow-up Information   ? ? [facility]. Schedule an appointment as soon as possible for a visit in 2 week(s).   ?Why: Use their pharmacy for medication assistance ?Contact information: ?[address] ?[place] [id number] ?[phone] ? ?  ?  ? ?  ?  ? ?  ? ?No Known Allergies ? ?Consultations: ?None ? ? ?Procedures/Studies: ?DG Cervical Spine 2 or 3 views ? ?Result Date: 08/08/2021 ?CLINICAL DATA:  Status post seizure with subsequent fall. EXAM: CERVICAL SPINE - 2-3 VIEW COMPARISON:  None. FINDINGS: There is no evidence of cervical spine fracture or prevertebral soft tissue swelling. Alignment is normal. No other significant bone abnormalities are identified. IMPRESSION: Negative cervical spine radiographs. Electronically Signed   By: Aram Candelahaddeus  Houston M.D.   On: 08/08/2021 15:19  ? ?CT Head Wo Contrast ? ?Result Date: 08/29/2021 ?CLINICAL DATA:  Seizure.  Neck injury. EXAM: CT HEAD WITHOUT CONTRAST CT CERVICAL SPINE WITHOUT CONTRAST TECHNIQUE: Multidetector CT imaging of the head and cervical spine was performed following the standard protocol without intravenous contrast. Multiplanar CT image reconstructions of the cervical spine were also generated. RADIATION DOSE REDUCTION: This exam was performed according to the departmental dose-optimization program which includes automated exposure control, adjustment of the mA and/or kV according to patient size and/or use of iterative reconstruction technique. COMPARISON:  August 07, 2021. FINDINGS: CT HEAD FINDINGS Brain: No evidence of acute infarction, hemorrhage, hydrocephalus, extra-axial collection or mass lesion/mass effect. Vascular: No hyperdense vessel or unexpected calcification. Skull: Normal. Negative for fracture or focal lesion. Sinuses/Orbits: No acute finding. Other: None. CT CERVICAL SPINE FINDINGS Alignment: Normal. Skull base and vertebrae: No  acute fracture. No primary bone lesion or focal pathologic process. Soft tissues and spinal canal: No prevertebral fluid or swelling. No visible canal hematoma. Disc levels:  Normal. Upper chest: Negative. Other: None. IMPRESSION: No acute intracranial abnormality seen. No definite abnormality seen in the cervical spine. Electronically Signed   By: Lupita RaiderJames  Green Jr M.D.   On: 08/29/2021 16:07  ? ?CT HEAD WO CONTRAST ? ?Result Date: 08/07/2021 ?CLINICAL DATA:  Trauma, altered mental status EXAM: CT HEAD WITHOUT CONTRAST TECHNIQUE: Contiguous axial images were obtained from the base of the skull through the vertex without intravenous contrast. RADIATION DOSE REDUCTION: This exam was performed according to the departmental dose-optimization program which includes automated exposure control, adjustment of the mA and/or kV according to patient size and/or use of iterative reconstruction technique. COMPARISON:  12/24/2020 FINDINGS: Brain: No acute intracranial findings are seen. There are no signs of bleeding. Ventricles are not dilated. There is no focal mass effect. Vascular: Unremarkable. Skull: No fracture is seen. Sinuses/Orbits: There is mucosal thickening in the ethmoid, sphenoid and left maxillary sinuses. Other: None IMPRESSION: No acute intracranial findings are seen in noncontrast CT brain. Mild chronic sinusitis. Electronically Signed   By: Ernie AvenaPalani  Rathinasamy M.D.   On: 08/07/2021 09:38  ? ?CT Cervical Spine Wo Contrast ? ?  Result Date: 08/29/2021 ?CLINICAL DATA:  Seizure.  Neck injury. EXAM: CT HEAD WITHOUT CONTRAST CT CERVICAL SPINE WITHOUT CONTRAST TECHNIQUE: Multidetector CT imaging of the head and cervical spine was performed following the standard protocol without intravenous contrast. Multiplanar CT image reconstructions of the cervical spine were also generated. RADIATION DOSE REDUCTION: This exam was performed according to the departmental dose-optimization program which includes automated exposure  control, adjustment of the mA and/or kV according to patient size and/or use of iterative reconstruction technique. COMPARISON:  August 07, 2021. FINDINGS: CT HEAD FINDINGS Brain: No evidence of acute infarction, hemorrha

## 2021-10-16 ENCOUNTER — Encounter (HOSPITAL_COMMUNITY): Payer: Self-pay | Admitting: Pharmacy Technician

## 2021-10-16 ENCOUNTER — Emergency Department (HOSPITAL_COMMUNITY)
Admission: EM | Admit: 2021-10-16 | Discharge: 2021-10-16 | Disposition: A | Discharge status: Home / Self Care | Payer: Self-pay | Attending: Emergency Medicine | Admitting: Emergency Medicine

## 2021-10-16 ENCOUNTER — Other Ambulatory Visit: Payer: Self-pay

## 2021-10-16 DIAGNOSIS — R569 Unspecified convulsions: Secondary | ICD-10-CM | POA: Insufficient documentation

## 2021-10-16 DIAGNOSIS — Y901 Blood alcohol level of 20-39 mg/100 ml: Secondary | ICD-10-CM | POA: Insufficient documentation

## 2021-10-16 DIAGNOSIS — R7309 Other abnormal glucose: Secondary | ICD-10-CM | POA: Insufficient documentation

## 2021-10-16 DIAGNOSIS — D649 Anemia, unspecified: Secondary | ICD-10-CM | POA: Insufficient documentation

## 2021-10-16 LAB — CBC WITH DIFFERENTIAL/PLATELET
Abs Immature Granulocytes: 0.08 10*3/uL — ABNORMAL HIGH (ref 0.00–0.07)
Basophils Absolute: 0 10*3/uL (ref 0.0–0.1)
Basophils Relative: 0 %
Eosinophils Absolute: 0 10*3/uL (ref 0.0–0.5)
Eosinophils Relative: 0 %
HCT: 39.8 % (ref 39.0–52.0)
Hemoglobin: 12.5 g/dL — ABNORMAL LOW (ref 13.0–17.0)
Immature Granulocytes: 1 %
Lymphocytes Relative: 8 %
Lymphs Abs: 0.7 10*3/uL (ref 0.7–4.0)
MCH: 31 pg (ref 26.0–34.0)
MCHC: 31.4 g/dL (ref 30.0–36.0)
MCV: 98.8 fL (ref 80.0–100.0)
Monocytes Absolute: 0.3 10*3/uL (ref 0.1–1.0)
Monocytes Relative: 4 %
Neutro Abs: 7.4 10*3/uL (ref 1.7–7.7)
Neutrophils Relative %: 87 %
Platelets: 246 10*3/uL (ref 150–400)
RBC: 4.03 MIL/uL — ABNORMAL LOW (ref 4.22–5.81)
RDW: 12.9 % (ref 11.5–15.5)
WBC: 8.5 10*3/uL (ref 4.0–10.5)
nRBC: 0 % (ref 0.0–0.2)

## 2021-10-16 LAB — RAPID URINE DRUG SCREEN, HOSP PERFORMED
Amphetamines: NOT DETECTED
Barbiturates: NOT DETECTED
Benzodiazepines: NOT DETECTED
Cocaine: NOT DETECTED
Opiates: NOT DETECTED
Tetrahydrocannabinol: NOT DETECTED

## 2021-10-16 LAB — COMPREHENSIVE METABOLIC PANEL
ALT: 224 U/L — ABNORMAL HIGH (ref 0–44)
AST: 1582 U/L — ABNORMAL HIGH (ref 15–41)
Albumin: 4.5 g/dL (ref 3.5–5.0)
Alkaline Phosphatase: 132 U/L — ABNORMAL HIGH (ref 38–126)
Anion gap: 29 — ABNORMAL HIGH (ref 5–15)
BUN: 19 mg/dL (ref 6–20)
CO2: 11 mmol/L — ABNORMAL LOW (ref 22–32)
Calcium: 8.7 mg/dL — ABNORMAL LOW (ref 8.9–10.3)
Chloride: 100 mmol/L (ref 98–111)
Creatinine, Ser: 0.99 mg/dL (ref 0.61–1.24)
GFR, Estimated: >60 mL/min (ref >60)
Glucose, Bld: 180 mg/dL — ABNORMAL HIGH (ref 70–99)
Potassium: 4 mmol/L (ref 3.5–5.1)
Sodium: 140 mmol/L (ref 135–145)
Total Bilirubin: 2.3 mg/dL — ABNORMAL HIGH (ref 0.3–1.2)
Total Protein: 7.8 g/dL (ref 6.5–8.1)

## 2021-10-16 LAB — ETHANOL: Alcohol, Ethyl (B): 34 mg/dL — ABNORMAL HIGH (ref <10)

## 2021-10-16 LAB — CBG MONITORING, ED: Glucose-Capillary: 178 mg/dL — ABNORMAL HIGH (ref 70–99)

## 2021-10-16 MED ORDER — KETOROLAC TROMETHAMINE 30 MG/ML IJ SOLN
30.0000 mg | Freq: Once | INTRAMUSCULAR | Status: AC
  Administered 2021-10-16: 30 mg via INTRAVENOUS
  Filled 2021-10-16: qty 1

## 2021-10-16 MED ORDER — METOCLOPRAMIDE HCL 5 MG/ML IJ SOLN
10.0000 mg | Freq: Once | INTRAMUSCULAR | Status: AC
Start: 2021-10-16 — End: 2021-10-16
  Administered 2021-10-16: 10 mg via INTRAVENOUS
  Filled 2021-10-16: qty 2

## 2021-10-16 MED ORDER — SODIUM CHLORIDE 0.9 % IV SOLN
2000.0000 mg | Freq: Once | INTRAVENOUS | Status: AC
  Administered 2021-10-16: 2000 mg via INTRAVENOUS
  Filled 2021-10-16: qty 20

## 2021-10-16 MED ORDER — LEVETIRACETAM 500 MG PO TABS: 500.0000 mg | ORAL_TABLET | Freq: Two times a day (BID) | ORAL | 1 refills | Status: AC

## 2021-10-16 MED ORDER — METOCLOPRAMIDE HCL 5 MG/ML IJ SOLN
10.0000 mg | Freq: Once | INTRAMUSCULAR | Status: AC
  Administered 2021-10-16: 10 mg via INTRAVENOUS
  Filled 2021-10-16: qty 2

## 2021-10-16 MED ORDER — ACETAMINOPHEN 325 MG PO TABS
650.0000 mg | ORAL_TABLET | Freq: Once | ORAL | Status: AC
  Administered 2021-10-16: 650 mg via ORAL
  Filled 2021-10-16: qty 2

## 2021-10-16 NOTE — ED Notes (Signed)
Pt remains nauseated. Also complaining of a headache. MD notified.

## 2021-10-16 NOTE — ED Notes (Addendum)
Attempted to ambulate pt, pt refused. Stating " I don't feel like walking right now. I want to rest for a little bit".

## 2021-10-16 NOTE — ED Triage Notes (Signed)
Pt bib ems after seizure lasting approx 1 minute. Pt with hx of same, noncompliant with meds. Pt found to be hypoglycemic with CBG 44. Given 1amp D10 and CBG 145 after. Pt then started feeling nauseated and having abdominal pain. Given 4mg  zofran. 18G RAC.  140/80 HR 110

## 2021-10-16 NOTE — ED Notes (Addendum)
When this RN walked in room to medicate pt, pt noted to appear postictal. Not responding. Pt breathing on his own. Oxygen saturations noted to be in the 40's. Pt placed on NRB at 15L with slow increase to 98%. MD notified.

## 2021-10-16 NOTE — ED Provider Notes (Signed)
[facility]-EMERGENCY DEPT Provider Note   CSN: [id number] Arrival date & time: 10/16/21  1540     History  Chief Complaint  Patient presents with   Seizures   Hypoglycemia    Gerald Anderson is a 31 y.o. male.  Patient with a history of seizures.  He had a seizure today.  Patient does not take his seizure medicine  The history is provided by the patient and medical records. No language interpreter was used.  Seizures Seizure activity on arrival: yes   Seizure type:  Focal Preceding symptoms: no sensation of an aura present   Initial focality:  None Episode characteristics: abnormal movements   Postictal symptoms: confusion   Return to baseline: yes   Severity:  Moderate Timing:  Once Progression:  Improving Hypoglycemia Associated symptoms: seizures       Home Medications Prior to Admission medications   Medication Sig Start Date End Date Taking? Authorizing Provider  levETIRAcetam (KEPPRA) 500 MG tablet Take 1 tablet (500 mg total) by mouth 2 (two) times daily. 10/16/21  Yes Bethann BerkshireZammit, Kenslei Hearty, MD  acetaminophen (TYLENOL) 325 MG tablet Take 325-650 mg by mouth daily as needed (pain).    [provider]  amLODipine (NORVASC) 5 MG tablet Take 1 tablet (5 mg total) by mouth daily. 08/30/21 11/28/21  Dorcas CarrowGhimire, Kuber, MD  ibuprofen (ADVIL) 800 MG tablet Take 800 mg by mouth daily as needed (pain).    [provider]      Allergies    Patient has no known allergies.    Review of Systems   Review of Systems  Constitutional:  Negative for appetite change and fatigue.  HENT:  Negative for congestion, ear discharge and sinus pressure.   Eyes:  Negative for discharge.  Respiratory:  Negative for cough.   Cardiovascular:  Negative for chest pain.  Gastrointestinal:  Negative for abdominal pain and diarrhea.  Genitourinary:  Negative for frequency and hematuria.  Musculoskeletal:  Negative for back pain.  Skin:  Negative for rash.   Neurological:  Positive for seizures. Negative for headaches.  Psychiatric/Behavioral:  Negative for hallucinations.    Physical Exam Updated Vital Signs BP 117/66   Pulse 77   Temp (!) 97 F (36.1 C) (Oral)   Resp 15   SpO2 98%  Physical Exam Vitals and nursing note reviewed.  Constitutional:      Appearance: He is well-developed.  HENT:     Head: Normocephalic.     Nose: Nose normal.  Eyes:     General: No scleral icterus.    Conjunctiva/sclera: Conjunctivae normal.  Neck:     Thyroid: No thyromegaly.  Cardiovascular:     Rate and Rhythm: Normal rate and regular rhythm.     Heart sounds: No murmur heard.   No friction rub. No gallop.  Pulmonary:     Breath sounds: No stridor. No wheezing or rales.  Chest:     Chest wall: No tenderness.  Abdominal:     General: There is no distension.     Tenderness: There is no abdominal tenderness. There is no rebound.  Musculoskeletal:        General: Normal range of motion.     Cervical back: Neck supple.  Lymphadenopathy:     Cervical: No cervical adenopathy.  Skin:    Findings: No erythema or rash.  Neurological:     Mental Status: He is alert and oriented to person, place, and time.     Motor: No abnormal muscle tone.  Coordination: Coordination normal.  Psychiatric:        Behavior: Behavior normal.    ED Results / Procedures / Treatments   Labs (all labs ordered are listed, but only abnormal results are displayed) Labs Reviewed  CBC WITH DIFFERENTIAL/PLATELET - Abnormal; Notable for the following components:      Result Value   RBC 4.03 (*)    Hemoglobin 12.5 (*)    Abs Immature Granulocytes 0.08 (*)    All other components within normal limits  COMPREHENSIVE METABOLIC PANEL - Abnormal; Notable for the following components:   CO2 11 (*)    Glucose, Bld 180 (*)    Calcium 8.7 (*)    AST 1,582 (*)    ALT 224 (*)    Alkaline Phosphatase 132 (*)    Total Bilirubin 2.3 (*)    Anion gap 29 (*)    All other  components within normal limits  ETHANOL - Abnormal; Notable for the following components:   Alcohol, Ethyl (B) 34 (*)    All other components within normal limits  CBG MONITORING, ED - Abnormal; Notable for the following components:   Glucose-Capillary 178 (*)    All other components within normal limits  RAPID URINE DRUG SCREEN, HOSP PERFORMED    EKG EKG Interpretation  Date/Time:  Wednesday Oct 16 2021 15:50:02 EDT Ventricular Rate:  99 PR Interval:  146 QRS Duration: 111 QT Interval:  395 QTC Calculation: 507 R Axis:   73 Text Interpretation: Sinus rhythm Probable left atrial enlargement Probable inferior infarct, old Prolonged QT interval Confirmed by Bethann Berkshire [phone]) on 10/16/2021 5:52:09 PM  Radiology No results found.  Procedures Procedures    Medications Ordered in ED Medications  levETIRAcetam (KEPPRA) 2,000 mg in sodium chloride 0.9 % 250 mL IVPB (0 mg Intravenous Stopped 10/16/21 1622)  metoCLOPramide (REGLAN) injection 10 mg (10 mg Intravenous Given 10/16/21 1656)  metoCLOPramide (REGLAN) injection 10 mg (10 mg Intravenous Given 10/16/21 1851)  acetaminophen (TYLENOL) tablet 650 mg (650 mg Oral Given 10/16/21 1850)  ketorolac (TORADOL) 30 MG/ML injection 30 mg (30 mg Intravenous Given 10/16/21 1923)    ED Course/ Medical Decision Making/ A&P  CRITICAL CARE Performed by: Bethann Berkshire Total critical care time: 45 minutes Critical care time was exclusive of separately billable procedures and treating other patients. Critical care was necessary to treat or prevent imminent or life-threatening deterioration. Critical care was time spent personally by me on the following activities: development of treatment plan with patient and/or surrogate as well as nursing, discussions with consultants, evaluation of patient's response to treatment, examination of patient, obtaining history from patient or surrogate, ordering and performing treatments and interventions,  ordering and review of laboratory studies, ordering and review of radiographic studies, pulse oximetry and re-evaluation of patient's condition.   Patient does not take his seizure medicine.  He had a seizure outside the hospital and then once in the emergency department.  He was loaded with Keppra and observed for a number hours and did well..  Patient is refusing to be monitored a longer stay in the hospital                         Medical Decision Making Amount and/or Complexity of Data Reviewed Labs: ordered.  Risk OTC drugs. Prescription drug management.  This patient presents to the ED for concern of seizure, this involves an extensive number of treatment options, and is a complaint that carries with it a high  risk of complications and morbidity.  The differential diagnosis includes stroke, seizure history and not taking   Co morbidities that complicate the patient evaluation  Seizures, EtOH abuse   Additional history obtained:  Additional history obtained from paramedics External records from outside source obtained and reviewed including hospital records   Lab Tests:  I Ordered, and personally interpreted labs.  The pertinent results include: CBC shows mild anemia at 12.5, glucose 1 8   Imaging Studies ordered:  No imaging  Cardiac Monitoring: / EKG:  The patient was maintained on a cardiac monitor.  I personally viewed and interpreted the cardiac monitored which showed an underlying rhythm of: Normal sinus rhythm   Consultations Obtained:  No consult  Problem List / ED Course / Critical interventions / Medication management  Seizures I ordered medication including Keppra Reevaluation of the patient after these medicines showed that the patient improved I have reviewed the patients home medicines and have made adjustments as needed   Social Determinants of Health:  History alcohol abuse   Test / Admission - Considered:  CT head  Patient with  seizures and noncompliance with taking his medicines.  He is instructed to get his Keppra and take it.  He was told that if he does not take his seizure medicine he could have a seizure and die.  Patient was monitored over 4 hours after his seizure and did well.  He is to follow-up with family doctor.  Patient is refusing to be monitored any longer or stay in the hospital        Final Clinical Impression(s) / ED Diagnoses Final diagnoses:  Seizures (HCC)    Rx / DC Orders ED Discharge Orders          Ordered    levETIRAcetam (KEPPRA) 500 MG tablet  2 times daily        10/16/21 2036              Bethann Berkshire, MD 10/19/21 1053

## 2021-10-16 NOTE — ED Notes (Signed)
Pt mother Locklan Canoy called. Molli Knock' d with the patient to speak with her. Update given.

## 2021-10-16 NOTE — Discharge Instructions (Signed)
Need to follow-up with either your seizure doctor or your family doctor in the next week.  You need to take your seizure medicine or you could have a seizure that could potentially kill you.
